# Patient Record
Sex: Female | Born: 1990 | Race: White | Hispanic: No | Marital: Married | Smoking: Never smoker
Health system: Southern US, Community
[De-identification: ages and names within clinical notes are randomized; demographics above are authoritative.]

## PROBLEM LIST (undated history)

## (undated) ENCOUNTER — Inpatient Hospital Stay (HOSPITAL_COMMUNITY): Payer: Self-pay

## (undated) DIAGNOSIS — N200 Calculus of kidney: Secondary | ICD-10-CM

## (undated) DIAGNOSIS — Z3490 Encounter for supervision of normal pregnancy, unspecified, unspecified trimester: Secondary | ICD-10-CM

## (undated) DIAGNOSIS — R87629 Unspecified abnormal cytological findings in specimens from vagina: Secondary | ICD-10-CM

## (undated) DIAGNOSIS — Z30017 Encounter for initial prescription of implantable subdermal contraceptive: Principal | ICD-10-CM

## (undated) DIAGNOSIS — D34 Benign neoplasm of thyroid gland: Secondary | ICD-10-CM

## (undated) HISTORY — PX: OTHER SURGICAL HISTORY: SHX169

## (undated) HISTORY — DX: Encounter for initial prescription of implantable subdermal contraceptive: Z30.017

## (undated) HISTORY — DX: Unspecified abnormal cytological findings in specimens from vagina: R87.629

## (undated) HISTORY — DX: Benign neoplasm of thyroid gland: D34

## (undated) HISTORY — DX: Calculus of kidney: N20.0

---
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
## 2012-04-15 ENCOUNTER — Encounter (HOSPITAL_COMMUNITY): Payer: Self-pay | Admitting: *Deleted

## 2012-04-15 ENCOUNTER — Emergency Department (HOSPITAL_COMMUNITY)
Admission: EM | Admit: 2012-04-15 | Discharge: 2012-04-15 | Disposition: A | Discharge status: Home / Self Care | Payer: Self-pay | Attending: Emergency Medicine | Admitting: Emergency Medicine

## 2012-04-15 DIAGNOSIS — R509 Fever, unspecified: Secondary | ICD-10-CM | POA: Insufficient documentation

## 2012-04-15 DIAGNOSIS — R599 Enlarged lymph nodes, unspecified: Secondary | ICD-10-CM | POA: Insufficient documentation

## 2012-04-15 DIAGNOSIS — J029 Acute pharyngitis, unspecified: Secondary | ICD-10-CM | POA: Insufficient documentation

## 2012-04-15 MED ORDER — PENICILLIN G BENZATHINE 1200000 UNIT/2ML IM SUSP
1.2000 10*6.[IU] | Freq: Once | INTRAMUSCULAR | Status: AC
  Administered 2012-04-15: 1.2 10*6.[IU] via INTRAMUSCULAR
  Filled 2012-04-15: qty 2

## 2012-04-15 NOTE — ED Provider Notes (Signed)
History     CSN: [id number]  Arrival date & time 04/15/12  1657   First MD Initiated Contact with Patient 04/15/12 1714      Chief Complaint  Patient presents with  . Sore Throat    (Consider location/radiation/quality/duration/timing/severity/associated sxs/prior treatment) Patient is a 21 y.o. female presenting with pharyngitis. The history is provided by the patient. No language interpreter was used.  Sore Throat This is a new problem. Episode onset: 2 days ago. The problem occurs constantly. The problem has been unchanged. Associated symptoms include a fever, a sore throat and swollen glands. Pertinent negatives include no headaches. The symptoms are aggravated by swallowing. She has tried nothing for the symptoms.    History reviewed. No pertinent past medical history.  Past Surgical History  Procedure Date  . Parathyroid gland removed     History reviewed. No pertinent family history.  History  Substance Use Topics  . Smoking status: Never Smoker   . Smokeless tobacco: Not on file  . Alcohol Use: No    OB History    Grav Para Term Preterm Abortions TAB SAB Ect Mult Living                  Review of Systems  Constitutional: Positive for fever.  HENT: Positive for sore throat.   Neurological: Negative for headaches.  All other systems reviewed and are negative.    Allergies  Review of patient's allergies indicates no known allergies.  Home Medications   Current Outpatient Rx  Name Route Sig Dispense Refill  . IBUPROFEN 200 MG PO TABS Oral Take 400 mg by mouth as needed. For sore throat/pain      BP 113/66  Pulse 77  Temp(Src) 98 F (36.7 C) (Oral)  Resp 24  Ht 5\' 7"  (1.702 m)  Wt 135 lb (61.236 kg)  BMI 21.14 kg/m2  SpO2 100%  LMP 04/13/2012  Physical Exam  Nursing note and vitals reviewed. Constitutional: She is oriented to person, place, and time. She appears well-developed and well-nourished. No distress.  HENT:  Head: Normocephalic  and atraumatic. No trismus in the jaw.  Right Ear: External ear normal.  Left Ear: External ear normal.  Mouth/Throat: Uvula is midline and mucous membranes are normal. No dental abscesses or uvula swelling. Oropharyngeal exudate and posterior oropharyngeal erythema present. No posterior oropharyngeal edema or tonsillar abscesses.  Eyes: EOM are normal.  Neck: Normal range of motion. No rigidity. Normal range of motion present.  Cardiovascular: Normal rate, regular rhythm and normal heart sounds.   Pulmonary/Chest: Effort normal and breath sounds normal.  Abdominal: Soft. She exhibits no distension. There is no tenderness.  Musculoskeletal: Normal range of motion.  Lymphadenopathy:    She has cervical adenopathy.       Right cervical: Superficial cervical adenopathy present.       Left cervical: Superficial cervical adenopathy present.  Neurological: She is alert and oriented to person, place, and time.  Skin: Skin is warm and dry.  Psychiatric: She has a normal mood and affect. Judgment normal.    ED Course  Procedures (including critical care time)  Labs Reviewed - No data to display No results found.   1. Acute pharyngitis       MDM    Strep appearance.  No sign of peritonsillar abscess.      Worthy Rancher, PA 04/15/12 [phone]

## 2012-04-15 NOTE — ED Notes (Signed)
No adverse reaction to injection noted.

## 2012-04-15 NOTE — ED Notes (Signed)
Pt c/o sore throat x 2 days. Denies cough or congestion. C/o painful swallowing.

## 2012-04-15 NOTE — Discharge Instructions (Signed)
Pharyngitis, Viral and Bacterial Pharyngitis is soreness (inflammation) or infection of the pharynx. It is also called a sore throat. CAUSES  Most sore throats are caused by viruses and are part of a cold. However, some sore throats are caused by strep and other bacteria. Sore throats can also be caused by post nasal drip from draining sinuses, allergies and sometimes from sleeping with an open mouth. Infectious sore throats can be spread from person to person by coughing, sneezing and sharing cups or eating utensils. TREATMENT  Sore throats that are viral usually last 3-4 days. Viral illness will get better without medications (antibiotics). Strep throat and other bacterial infections will usually begin to get better about 24-48 hours after you begin to take antibiotics. HOME CARE INSTRUCTIONS   If the caregiver feels there is a bacterial infection or if there is a positive strep test, they will prescribe an antibiotic. The full course of antibiotics must be taken. If the full course of antibiotic is not taken, you or your child may become ill again. If you or your child has strep throat and do not finish all of the medication, serious heart or kidney diseases may develop.   Drink enough water and fluids to keep your urine clear or pale yellow.   Only take over-the-counter or prescription medicines for pain, discomfort or fever as directed by your caregiver.   Get lots of rest.   Gargle with salt water ( tsp. of salt in a glass of water) as often as every 1-2 hours as you need for comfort.   Hard candies may soothe the throat if individual is not at risk for choking. Throat sprays or lozenges may also be used.  SEEK MEDICAL CARE IF:   Large, tender lumps in the neck develop.   A rash develops.   Green, yellow-brown or bloody sputum is coughed up.   Your baby is older than 3 months with a rectal temperature of 100.5 F (38.1 C) or higher for more than 1 day.  SEEK IMMEDIATE MEDICAL CARE  IF:   A stiff neck develops.   You or your child are drooling or unable to swallow liquids.   You or your child are vomiting, unable to keep medications or liquids down.   You or your child has severe pain, unrelieved with recommended medications.   You or your child are having difficulty breathing (not due to stuffy nose).   You or your child are unable to fully open your mouth.   You or your child develop redness, swelling, or severe pain anywhere on the neck.   You have a fever.   Your baby is older than 3 months with a rectal temperature of 102 F (38.9 C) or higher.   Your baby is 35 months old or younger with a rectal temperature of 100.4 F (38 C) or higher.  MAKE SURE YOU:   Understand these instructions.   Will watch your condition.   Will get help right away if you are not doing well or get worse.  Document Released: 11/18/2005 Document Revised: 11/07/2011 Document Reviewed: 02/15/2008 [facility] Patient Information 2012 [place], [place].   You have been presumptively treated for strep throat.  Take tylenol or ibuprofen for fever or discomfort.  Gargle frequently with salt water.   Chloraseptic will help also.

## 2012-04-15 NOTE — ED Provider Notes (Signed)
Medical screening examination/treatment/procedure(s) were performed by non-physician practitioner and as supervising physician I was immediately available for consultation/collaboration.   Gage Weant, MD 04/15/12 1925 

## 2012-06-27 IMAGING — NM NM OCTREOTIDE LOCALIZATION
1 series · 6 of 6 positions shown · non-contrast
Comparison: None

CLINICAL DATA: Elevated parathyroid levels.  Parathyroid hormone
level equals 102

NUCLEAR MEDICINE PARATHYROID SCAN WITH SPECT IMAGING
TECHNIQUE: After intravenous administration of radiopharmaceutical
images of the neck and mediastinum were obtained at approximately
10 minutes 90 minutes and 180 minutes. SPECT imaging performed.
Radiopharmaceutical: 26.0  mCi technetium sestamibi

[parathyroid · 4.66mm/px · 6 of 128 frames shown]
[frame 11/128]
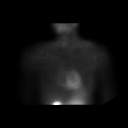
[frame 32/128]
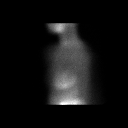
[frame 54/128]
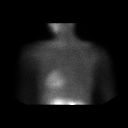
[frame 75/128]
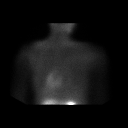
[frame 96/128]
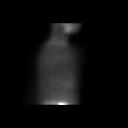
[frame 118/128]
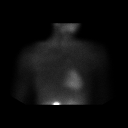

[6 of 6 positions shown; findings below may reference images not displayed]

FINDINGS: There is normal uptake within the thyroid gland on the
immediate 15-minute planar images.  There is washout of activity
from the thyroid gland on 1 hour and 2 hours delayed planar images.
On the two hour delayed planar image, there is a focus of retained
activity in the region of the inferior aspect of the right lobe the
thyroid gland. of the thyroid gland.  This activity is confirmed on
the SPECT images.
IMPRESSION: Focal activity localizing to the inferior aspect of the right lobe
of the thyroid gland would be consistent with a parathyroid adenoma
in the appropriate clinical setting.

## 2012-09-24 ENCOUNTER — Emergency Department (HOSPITAL_COMMUNITY): Payer: Medicaid Other

## 2012-09-24 ENCOUNTER — Emergency Department (HOSPITAL_COMMUNITY)
Admission: EM | Admit: 2012-09-24 | Discharge: 2012-09-24 | Disposition: A | Discharge status: Home / Self Care | Payer: Medicaid Other | Attending: Emergency Medicine | Admitting: Emergency Medicine

## 2012-09-24 ENCOUNTER — Encounter (HOSPITAL_COMMUNITY): Payer: Self-pay | Admitting: *Deleted

## 2012-09-24 DIAGNOSIS — R1013 Epigastric pain: Secondary | ICD-10-CM | POA: Insufficient documentation

## 2012-09-24 DIAGNOSIS — Z3490 Encounter for supervision of normal pregnancy, unspecified, unspecified trimester: Secondary | ICD-10-CM

## 2012-09-24 DIAGNOSIS — O99891 Other specified diseases and conditions complicating pregnancy: Secondary | ICD-10-CM | POA: Insufficient documentation

## 2012-09-24 HISTORY — DX: Encounter for supervision of normal pregnancy, unspecified, unspecified trimester: Z34.90

## 2012-09-24 LAB — BASIC METABOLIC PANEL
CO2: 23 mEq/L (ref 19–32)
GFR calc non Af Amer: >90 mL/min (ref >90)
Glucose, Bld: 85 mg/dL (ref 70–99)
Potassium: 3.6 mEq/L (ref 3.5–5.1)
Sodium: 137 mEq/L (ref 135–145)

## 2012-09-24 LAB — HCG, QUANTITATIVE, PREGNANCY: hCG, Beta Chain, Quant, S: 386 m[IU]/mL — ABNORMAL HIGH (ref <5)

## 2012-09-24 LAB — URINALYSIS, ROUTINE W REFLEX MICROSCOPIC
Leukocytes, UA: NEGATIVE
Nitrite: NEGATIVE
Specific Gravity, Urine: <1.005 — ABNORMAL LOW (ref 1.005–1.030)
Urobilinogen, UA: 0.2 mg/dL (ref 0.0–1.0)
pH: 6.5 (ref 5.0–8.0)

## 2012-09-24 LAB — CBC
Hemoglobin: 13.1 g/dL (ref 12.0–15.0)
Platelets: 263 10*3/uL (ref 150–400)
RBC: 4.44 MIL/uL (ref 3.87–5.11)

## 2012-09-24 LAB — PREGNANCY, URINE: Preg Test, Ur: POSITIVE — AB

## 2012-09-24 LAB — ABO/RH: ABO/RH(D): A NEG

## 2012-09-24 MED ORDER — PROMETHAZINE HCL 25 MG/ML IJ SOLN: 12.5000 mg | Freq: Once | INTRAMUSCULAR | Status: DC

## 2012-09-24 MED ORDER — HYDROMORPHONE HCL PF 1 MG/ML IJ SOLN: 1.0000 mg | Freq: Once | INTRAMUSCULAR | Status: DC

## 2012-09-24 NOTE — ED Provider Notes (Signed)
History   This chart was scribed for Aimee Co, MD by Aimee Burns. The patient was seen in room APA08/APA08 and the patient's care was started at 1:12PM.     CSN: [id number]  Arrival date & time 09/24/12  1147   First MD Initiated Contact with Patient 09/24/12 1312      Chief Complaint  Patient presents with  . Abdominal Pain    (Consider location/radiation/quality/duration/timing/severity/associated sxs/prior treatment) The history is provided by the patient.    Aimee Burns is a 21 y.o. female who presents to the Emergency Department complaining of   Generalized upper abdominal pain located at the epigastrium, onset yesterday (09/23/12).  Associated symptoms include lower back pain, both of which began yesterday (09/23/12) The pt reports she was referred to [facility] by her PCP to follow up with her abdominal and lower back pain. The pt has recently had 2 positive pregnancy tests ( one at home, one at [facility] office), prior to being seen today at [facility]. The pt's LNMP was 08/23/12.   The pt denies dysuria, lower abdominal pain, nausea, and vomiting.   The pt does not smoke or drink alcohol.   Pt does not have OB/GYN at present.    Past Medical History  Diagnosis Date  . Pregnancy     Past Surgical History  Procedure Date  . Parathyroid gland removed     History reviewed. No pertinent family history.  History  Substance Use Topics  . Smoking status: Never Smoker   . Smokeless tobacco: Not on file  . Alcohol Use: No    OB History    Grav Para Term Preterm Abortions TAB SAB Ect Mult Living                  Review of Systems  Gastrointestinal: Positive for abdominal pain.  Musculoskeletal: Positive for back pain.  All other systems reviewed and are negative.    Allergies  Review of patient's allergies indicates no known allergies.  Home Medications  No current outpatient prescriptions on file.  BP 124/69  Pulse 85  Temp 97.5 F (36.4 C)  Resp 20  Ht  5\' 8"  (1.727 m)  Wt 128 lb (58.06 kg)  BMI 19.46 kg/m2  SpO2 100%  LMP 08/23/2012  Physical Exam  Nursing note and vitals reviewed. Constitutional: She is oriented to person, place, and time. She appears well-developed and well-nourished. No distress.  HENT:  Head: Normocephalic and atraumatic.  Eyes: EOM are normal.  Neck: Normal range of motion.  Cardiovascular: Normal rate, regular rhythm and normal heart sounds.   Pulmonary/Chest: Effort normal and breath sounds normal.  Abdominal: Soft. She exhibits no distension. There is tenderness.       Epigastric tenderness detected. No lower abdominal tenderness detected.   Musculoskeletal: Normal range of motion.  Neurological: She is alert and oriented to person, place, and time.  Skin: Skin is warm and dry.  Psychiatric: She has a normal mood and affect. Judgment normal.    ED Course  Procedures (including critical care time)  DIAGNOSTIC STUDIES: Oxygen Saturation is 100% on room air, normal by my interpretation.    COORDINATION OF CARE:  1:20 PM- Treatment plan concerning blood work, ultrasound, and taking prenatal vitamins  discussed with patient. Pt agrees with treatment.      Results for orders placed during the hospital encounter of 09/24/12  PREGNANCY, URINE      Component Value Range   Preg Test, Ur POSITIVE (*) NEGATIVE  URINALYSIS,  ROUTINE W REFLEX MICROSCOPIC      Component Value Range   Color, Urine YELLOW  YELLOW   APPearance CLEAR  CLEAR   Specific Gravity, Urine <1.005 (*) 1.005 - 1.030   pH 6.5  5.0 - 8.0   Glucose, UA NEGATIVE  NEGATIVE mg/dL   Hgb urine dipstick NEGATIVE  NEGATIVE   Bilirubin Urine NEGATIVE  NEGATIVE   Ketones, ur NEGATIVE  NEGATIVE mg/dL   Protein, ur NEGATIVE  NEGATIVE mg/dL   Urobilinogen, UA 0.2  0.0 - 1.0 mg/dL   Nitrite NEGATIVE  NEGATIVE   Leukocytes, UA NEGATIVE  NEGATIVE  CBC      Component Value Range   WBC 7.0  4.0 - 10.5 K/uL   RBC 4.44  3.87 - 5.11 MIL/uL    Hemoglobin 13.1  12.0 - 15.0 g/dL   HCT 30.8  65.7 - 84.6 %   MCV 87.6  78.0 - 100.0 fL   MCH 29.5  26.0 - 34.0 pg   MCHC 33.7  30.0 - 36.0 g/dL   RDW 96.2  95.2 - 84.1 %   Platelets 263  150 - 400 K/uL  BASIC METABOLIC PANEL      Component Value Range   Sodium 137  135 - 145 mEq/L   Potassium 3.6  3.5 - 5.1 mEq/L   Chloride 104  96 - 112 mEq/L   CO2 23  19 - 32 mEq/L   Glucose, Bld 85  70 - 99 mg/dL   BUN 7  6 - 23 mg/dL   Creatinine, Ser 3.24  0.50 - 1.10 mg/dL   Calcium 9.8  8.4 - 40.1 mg/dL   GFR calc non Af Amer >90  >90 mL/min   GFR calc Af Amer >90  >90 mL/min  ABO/RH      Component Value Range   ABO/RH(D) A NEG    HCG, QUANTITATIVE, PREGNANCY      Component Value Range   hCG, Beta Chain, Quant, S 386 (*) <5 mIU/mL   US Ob Comp Less 14 Wks  09/24/2012  *RADIOLOGY REPORT*  Clinical Data: Left-sided abdominal pain.  4-week-4-day gestational age by LMP.  Positive pregnancy test.  OBSTETRIC <14 WK [place] AND TRANSVAGINAL OB US  Technique:  Both transabdominal and transvaginal ultrasound examinations were performed for complete evaluation of the gestation as well as the maternal uterus, adnexal regions, and pelvic cul-de-sac.  Transvaginal technique was performed to assess early pregnancy.  Comparison:  None.  Findings:  The endometrium has a thickened decidualized appearance, and there is a 3 mm fluid collection in the endometrial cavity likely representing an early intrauterine gestational sac.  No yolk sac visualized.  A left ovarian corpus luteum cyst is seen which measures 3.5 cm.  A small amount of free fluid is seen in the left adnexa.  Right ovary is not directly visualized by transabdominal or transvaginal sonography, however no adnexal mass identified.  IMPRESSION:  1.  Probable early approximately 5-week intrauterine gestational sac, which would correspond with the given LMP.  Recommend follow up of quantitative beta HCG levels, and follow-up by ultrasound in 14 days to confirm  appropriate progression/viability. 2.  3.5 cm left ovarian corpus luteum and small amount of free fluid.  No adnexal mass identified.   Original Report Authenticated By: Danae Orleans, M.D.    US Ob Transvaginal  09/24/2012  *RADIOLOGY REPORT*  Clinical Data: Left-sided abdominal pain.  4-week-4-day gestational age by LMP.  Positive pregnancy test.  OBSTETRIC <14 WK  [place] AND TRANSVAGINAL OB US  Technique:  Both transabdominal and transvaginal ultrasound examinations were performed for complete evaluation of the gestation as well as the maternal uterus, adnexal regions, and pelvic cul-de-sac.  Transvaginal technique was performed to assess early pregnancy.  Comparison:  None.  Findings:  The endometrium has a thickened decidualized appearance, and there is a 3 mm fluid collection in the endometrial cavity likely representing an early intrauterine gestational sac.  No yolk sac visualized.  A left ovarian corpus luteum cyst is seen which measures 3.5 cm.  A small amount of free fluid is seen in the left adnexa.  Right ovary is not directly visualized by transabdominal or transvaginal sonography, however no adnexal mass identified.  IMPRESSION:  1.  Probable early approximately 5-week intrauterine gestational sac, which would correspond with the given LMP.  Recommend follow up of quantitative beta HCG levels, and follow-up by ultrasound in 14 days to confirm appropriate progression/viability. 2.  3.5 cm left ovarian corpus luteum and small amount of free fluid.  No adnexal mass identified.   Original Report Authenticated By: Danae Orleans, M.D.     I personally reviewed the imaging tests through PACS system  I reviewed available ER/hospitalization records thought the EMR    1. Pregnant       MDM  The patient has no lower abdominal pain.  She is no vaginal bleeding.  Quant is 380.  Early gestational sac seen.  No obvious adnexal masses noted.  Early ectopic precautions given.  Close OB/GYN followup.   Instructions return to the ER for development of lower abdominal pain or vaginal bleeding or any other new or concerning symptoms.  Recommend the patient not drink, use illicit drugs, smokes cigarettes.  I recommended daily prenatal vitamins      I personally performed the services described in this documentation, which was scribed in my presence. The recorded information has been reviewed and considered.      Aimee Co, MD 09/24/12 [phone]

## 2012-09-24 NOTE — ED Notes (Signed)
Pt c/o abd pain located around umbilicus area that started last night, denies any n/v/d, admits to vaginal discharge, unsure of any color, denies any odor, pt states that she did take a home pregnancy test that was positive and had a pregnancy test done at pcp office today that was positive, pt was sent to er by pcp when she advised them ref. The abd pain. Last period was 08/23/2012

## 2012-10-12 LAB — OB RESULTS CONSOLE ANTIBODY SCREEN: Antibody Screen: NEGATIVE

## 2012-10-12 LAB — OB RESULTS CONSOLE RPR: RPR: NONREACTIVE

## 2012-10-12 LAB — OB RESULTS CONSOLE HIV ANTIBODY (ROUTINE TESTING): HIV: NONREACTIVE

## 2012-10-12 LAB — OB RESULTS CONSOLE ABO/RH: RH Type: NEGATIVE

## 2012-10-12 LAB — OB RESULTS CONSOLE GC/CHLAMYDIA: Gonorrhea: NEGATIVE

## 2012-10-12 LAB — OB RESULTS CONSOLE HEPATITIS B SURFACE ANTIGEN: Hepatitis B Surface Ag: NEGATIVE

## 2012-12-02 NOTE — L&D Delivery Note (Signed)
Delivery Note At 8:54 PM a viable female was delivered via Vaginal, Vacuum (Extractor) with 1 pull.  No pop offs during extraction.  APGAR:  Unavailable at time of note.  Placenta status: Intact, Spontaneous.  Cord: 3 vessels with the following complications: nuchal x3.  Cord pH: 7.30 (venous).   Anesthesia: Epidural  Episiotomy: None Lacerations: 1st degree laceration Suture Repair: 3.0 vicryl Est. Blood Loss (mL):  300 cc  Mom to postpartum.  Baby to nursery-stable.  [facility] 05/17/2013, 9:15 PM

## 2012-12-02 NOTE — L&D Delivery Note (Signed)
Operative Delivery Note At  a viable female was delivered via vacuum assisted delivery .  Presentation: vertex; Position: Left,, Occiput,, Anterior; Station: +3.  Verbal consent: obtained from patient.  Risks and benefits discussed in detail.  Risks include, but are not limited to the risks of anesthesia, bleeding, infection, damage to maternal tissues, fetal cephalhematoma.  There is also the risk of inability to effect vaginal delivery of the head, or shoulder dystocia that cannot be resolved by established maneuvers, leading to the need for emergency cesarean section.  APGAR: , ; weight .   Placenta status: , .   Cord:  with the following complications: .  Cord pH: pending  Anesthesia:  epidural Instruments: Kiwi vacuum Episiotomy: none Lacerations: 1st degree vaginal laceration Suture Repair: 3.0 vicryl Est. Blood Loss (mL):   Mom to postpartum.  Baby to nursery-stable.  HARRAWAY-SMITH, Aariana Shankland 05/17/2013, 9:38 PM

## 2013-02-11 ENCOUNTER — Encounter: Payer: Self-pay | Admitting: *Deleted

## 2013-02-11 DIAGNOSIS — D34 Benign neoplasm of thyroid gland: Secondary | ICD-10-CM | POA: Insufficient documentation

## 2013-02-11 DIAGNOSIS — N200 Calculus of kidney: Secondary | ICD-10-CM | POA: Insufficient documentation

## 2013-03-08 ENCOUNTER — Ambulatory Visit (INDEPENDENT_AMBULATORY_CARE_PROVIDER_SITE_OTHER): Payer: Medicaid Other | Admitting: Obstetrics and Gynecology

## 2013-03-08 ENCOUNTER — Encounter: Payer: Self-pay | Admitting: Obstetrics and Gynecology

## 2013-03-08 VITALS — BP 106/64 | Wt 148.2 lb

## 2013-03-08 DIAGNOSIS — O99019 Anemia complicating pregnancy, unspecified trimester: Secondary | ICD-10-CM

## 2013-03-08 DIAGNOSIS — Z331 Pregnant state, incidental: Secondary | ICD-10-CM

## 2013-03-08 DIAGNOSIS — Z3490 Encounter for supervision of normal pregnancy, unspecified, unspecified trimester: Secondary | ICD-10-CM

## 2013-03-08 DIAGNOSIS — D34 Benign neoplasm of thyroid gland: Secondary | ICD-10-CM

## 2013-03-08 DIAGNOSIS — Z1389 Encounter for screening for other disorder: Secondary | ICD-10-CM

## 2013-03-08 DIAGNOSIS — O360990 Maternal care for other rhesus isoimmunization, unspecified trimester, not applicable or unspecified: Secondary | ICD-10-CM

## 2013-03-08 LAB — CBC
HCT: 31.7 % — ABNORMAL LOW (ref 36.0–46.0)
MCHC: 32.5 g/dL (ref 30.0–36.0)
Platelets: 241 10*3/uL (ref 150–400)
RDW: 14 % (ref 11.5–15.5)
WBC: 10.1 10*3/uL (ref 4.0–10.5)

## 2013-03-08 LAB — POCT URINALYSIS DIPSTICK
Ketones, UA: NEGATIVE
Leukocytes, UA: NEGATIVE
Nitrite, UA: NEGATIVE
Protein, UA: NEGATIVE

## 2013-03-08 NOTE — Progress Notes (Signed)
[redacted]w[redacted]d:  Routine prenatal chk:  28 wk labs drawn ; childbirth classes encouraged FOB supportive jvf

## 2013-03-09 LAB — HIV ANTIBODY (ROUTINE TESTING W REFLEX): HIV: NONREACTIVE

## 2013-03-09 LAB — GLUCOSE TOLERANCE, 2 HOURS W/ 1HR: Glucose, Fasting: 75 mg/dL (ref 70–99)

## 2013-03-09 LAB — HSV 2 ANTIBODY, IGG: HSV 2 Glycoprotein G Ab, IgG: <0.1 IV

## 2013-03-09 LAB — RPR

## 2013-04-06 ENCOUNTER — Ambulatory Visit (INDEPENDENT_AMBULATORY_CARE_PROVIDER_SITE_OTHER): Payer: Medicaid Other | Admitting: Advanced Practice Midwife

## 2013-04-06 VITALS — BP 100/60 | Wt 152.0 lb

## 2013-04-06 DIAGNOSIS — O360990 Maternal care for other rhesus isoimmunization, unspecified trimester, not applicable or unspecified: Secondary | ICD-10-CM

## 2013-04-06 DIAGNOSIS — O99019 Anemia complicating pregnancy, unspecified trimester: Secondary | ICD-10-CM

## 2013-04-06 DIAGNOSIS — Z3400 Encounter for supervision of normal first pregnancy, unspecified trimester: Secondary | ICD-10-CM | POA: Insufficient documentation

## 2013-04-06 DIAGNOSIS — Z1389 Encounter for screening for other disorder: Secondary | ICD-10-CM

## 2013-04-06 DIAGNOSIS — Z331 Pregnant state, incidental: Secondary | ICD-10-CM

## 2013-04-06 LAB — POCT URINALYSIS DIPSTICK
Blood, UA: NEGATIVE
Glucose, UA: NEGATIVE
Ketones, UA: NEGATIVE

## 2013-04-06 MED ORDER — RHO D IMMUNE GLOBULIN 1500 UNIT/2ML IJ SOLN
300.0000 ug | Freq: Once | INTRAMUSCULAR | Status: AC
  Administered 2013-04-06: 300 ug via INTRAMUSCULAR

## 2013-04-06 NOTE — Progress Notes (Signed)
"  braxton hicks"

## 2013-04-06 NOTE — Progress Notes (Signed)
Had an episode of BH contractions at work.  No c/o at this time.Received Rhogam today  Routine questions about pregnancy answered.  F/U in 2 weeks for LROB.

## 2013-04-20 ENCOUNTER — Ambulatory Visit (INDEPENDENT_AMBULATORY_CARE_PROVIDER_SITE_OTHER): Payer: Medicaid Other | Admitting: Women's Health

## 2013-04-20 ENCOUNTER — Encounter: Payer: Self-pay | Admitting: Women's Health

## 2013-04-20 ENCOUNTER — Other Ambulatory Visit: Payer: Self-pay | Admitting: Women's Health

## 2013-04-20 ENCOUNTER — Ambulatory Visit (INDEPENDENT_AMBULATORY_CARE_PROVIDER_SITE_OTHER): Payer: Medicaid Other

## 2013-04-20 VITALS — BP 110/60 | Wt 155.8 lb

## 2013-04-20 DIAGNOSIS — Z3403 Encounter for supervision of normal first pregnancy, third trimester: Secondary | ICD-10-CM

## 2013-04-20 DIAGNOSIS — O26849 Uterine size-date discrepancy, unspecified trimester: Secondary | ICD-10-CM | POA: Insufficient documentation

## 2013-04-20 DIAGNOSIS — O368390 Maternal care for abnormalities of the fetal heart rate or rhythm, unspecified trimester, not applicable or unspecified: Secondary | ICD-10-CM

## 2013-04-20 DIAGNOSIS — O368190 Decreased fetal movements, unspecified trimester, not applicable or unspecified: Secondary | ICD-10-CM

## 2013-04-20 DIAGNOSIS — Z1389 Encounter for screening for other disorder: Secondary | ICD-10-CM

## 2013-04-20 DIAGNOSIS — O26843 Uterine size-date discrepancy, third trimester: Secondary | ICD-10-CM

## 2013-04-20 DIAGNOSIS — Z331 Pregnant state, incidental: Secondary | ICD-10-CM

## 2013-04-20 LAB — POCT URINALYSIS DIPSTICK
Glucose, UA: NEGATIVE
Ketones, UA: NEGATIVE
Protein, UA: NEGATIVE

## 2013-04-20 NOTE — Progress Notes (Addendum)
U/S(33+4wks)-vtx active fetus BPP 8/8, fluid wnl AFI=9.5cm, post gr 1 plac, EFW 4 lb 2 oz (1878gms, 4 lb 2 oz), 21st%tile, female fetus ("[place]")

## 2013-04-20 NOTE — Progress Notes (Addendum)
Reports decreased fm x 1wk. Denies uc's, lof, vb, urinary urgency, hesitancy, or dysuria.  Does have urinary frequency- urine dip today neg.  All questions answered. NST non-reactive.  U/S today BPP 8/8, efw 21%, afi 9.5cm. Reviewed ptl s/s and fetal kick counts.  F/u 2wks for visit.

## 2013-04-20 NOTE — Patient Instructions (Signed)
Fetal Movement Counts Patient Name: __________________________________________________ Patient Due Date: ____________________ Kick counts is highly recommended in high risk pregnancies, but it is a good idea for every pregnant woman to do. Start counting fetal movements at 28 weeks of the pregnancy. Fetal movements increase after eating a full meal or eating or drinking something sweet (the blood sugar is higher). It is also important to drink plenty of fluids (well hydrated) before doing the count. Lie on your left side because it helps with the circulation or you can sit in a comfortable chair with your arms over your belly (abdomen) with no distractions around you. DOING THE COUNT  Try to do the count the same time of day each time you do it.  Mark the day and time, then see how long it takes for you to feel 10 movements (kicks, flutters, swishes, rolls). You should have at least 10 movements within 2 hours. You will most likely feel 10 movements in much less than 2 hours. If you do not, wait an hour and count again. After a couple of days you will see a pattern.  What you are looking for is a change in the pattern or not enough counts in 2 hours. Is it taking longer in time to reach 10 movements? SEEK MEDICAL CARE IF:  You feel less than 10 counts in 2 hours. Tried twice.  No movement in one hour.  The pattern is changing or taking longer each day to reach 10 counts in 2 hours.  You feel the baby is not moving as it usually does. Date: ____________ Movements: ____________ Start time: ____________ Finish time: ____________  Date: ____________ Movements: ____________ Start time: ____________ Finish time: ____________ Date: ____________ Movements: ____________ Start time: ____________ Finish time: ____________ Date: ____________ Movements: ____________ Start time: ____________ Finish time: ____________ Date: ____________ Movements: ____________ Start time: ____________ Finish time:  ____________ Date: ____________ Movements: ____________ Start time: ____________ Finish time: ____________ Date: ____________ Movements: ____________ Start time: ____________ Finish time: ____________ Date: ____________ Movements: ____________ Start time: ____________ Finish time: ____________  Date: ____________ Movements: ____________ Start time: ____________ Finish time: ____________ Date: ____________ Movements: ____________ Start time: ____________ Finish time: ____________ Date: ____________ Movements: ____________ Start time: ____________ Finish time: ____________ Date: ____________ Movements: ____________ Start time: ____________ Finish time: ____________ Date: ____________ Movements: ____________ Start time: ____________ Finish time: ____________ Date: ____________ Movements: ____________ Start time: ____________ Finish time: ____________ Date: ____________ Movements: ____________ Start time: ____________ Finish time: ____________  Date: ____________ Movements: ____________ Start time: ____________ Finish time: ____________ Date: ____________ Movements: ____________ Start time: ____________ Finish time: ____________ Date: ____________ Movements: ____________ Start time: ____________ Finish time: ____________ Date: ____________ Movements: ____________ Start time: ____________ Finish time: ____________ Date: ____________ Movements: ____________ Start time: ____________ Finish time: ____________ Date: ____________ Movements: ____________ Start time: ____________ Finish time: ____________ Date: ____________ Movements: ____________ Start time: ____________ Finish time: ____________  Date: ____________ Movements: ____________ Start time: ____________ Finish time: ____________ Date: ____________ Movements: ____________ Start time: ____________ Finish time: ____________ Date: ____________ Movements: ____________ Start time: ____________ Finish time: ____________ Date: ____________ Movements:  ____________ Start time: ____________ Finish time: ____________ Date: ____________ Movements: ____________ Start time: ____________ Finish time: ____________ Date: ____________ Movements: ____________ Start time: ____________ Finish time: ____________ Date: ____________ Movements: ____________ Start time: ____________ Finish time: ____________  Date: ____________ Movements: ____________ Start time: ____________ Finish time: ____________ Date: ____________ Movements: ____________ Start time: ____________ Finish time: ____________ Date: ____________ Movements: ____________ Start time: ____________ Finish time: ____________ Date: ____________ Movements:   ____________ Start time: ____________ Finish time: ____________ Date: ____________ Movements: ____________ Start time: ____________ Finish time: ____________ Date: ____________ Movements: ____________ Start time: ____________ Finish time: ____________ Date: ____________ Movements: ____________ Start time: ____________ Finish time: ____________  Date: ____________ Movements: ____________ Start time: ____________ Finish time: ____________ Date: ____________ Movements: ____________ Start time: ____________ Finish time: ____________ Date: ____________ Movements: ____________ Start time: ____________ Finish time: ____________ Date: ____________ Movements: ____________ Start time: ____________ Finish time: ____________ Date: ____________ Movements: ____________ Start time: ____________ Finish time: ____________ Date: ____________ Movements: ____________ Start time: ____________ Finish time: ____________ Date: ____________ Movements: ____________ Start time: ____________ Finish time: ____________  Date: ____________ Movements: ____________ Start time: ____________ Finish time: ____________ Date: ____________ Movements: ____________ Start time: ____________ Finish time: ____________ Date: ____________ Movements: ____________ Start time: ____________ Finish  time: ____________ Date: ____________ Movements: ____________ Start time: ____________ Finish time: ____________ Date: ____________ Movements: ____________ Start time: ____________ Finish time: ____________ Date: ____________ Movements: ____________ Start time: ____________ Finish time: ____________ Date: ____________ Movements: ____________ Start time: ____________ Finish time: ____________  Date: ____________ Movements: ____________ Start time: ____________ Finish time: ____________ Date: ____________ Movements: ____________ Start time: ____________ Finish time: ____________ Date: ____________ Movements: ____________ Start time: ____________ Finish time: ____________ Date: ____________ Movements: ____________ Start time: ____________ Finish time: ____________ Date: ____________ Movements: ____________ Start time: ____________ Finish time: ____________ Date: ____________ Movements: ____________ Start time: ____________ Finish time: ____________ Document Released: 12/18/2006 Document Revised: 02/10/2012 Document Reviewed: 06/20/2009 [employer] Patient Information 2013 [employer].  

## 2013-04-28 LAB — US OB FOLLOW UP

## 2013-05-04 ENCOUNTER — Encounter: Payer: Self-pay | Admitting: Women's Health

## 2013-05-04 ENCOUNTER — Ambulatory Visit (INDEPENDENT_AMBULATORY_CARE_PROVIDER_SITE_OTHER): Payer: Medicaid Other | Admitting: Women's Health

## 2013-05-04 VITALS — BP 118/60 | Wt 158.0 lb

## 2013-05-04 DIAGNOSIS — Z331 Pregnant state, incidental: Secondary | ICD-10-CM

## 2013-05-04 DIAGNOSIS — O288 Other abnormal findings on antenatal screening of mother: Secondary | ICD-10-CM

## 2013-05-04 DIAGNOSIS — Z1389 Encounter for screening for other disorder: Secondary | ICD-10-CM

## 2013-05-04 DIAGNOSIS — Z3403 Encounter for supervision of normal first pregnancy, third trimester: Secondary | ICD-10-CM

## 2013-05-04 DIAGNOSIS — O360990 Maternal care for other rhesus isoimmunization, unspecified trimester, not applicable or unspecified: Secondary | ICD-10-CM

## 2013-05-04 DIAGNOSIS — O99019 Anemia complicating pregnancy, unspecified trimester: Secondary | ICD-10-CM

## 2013-05-04 LAB — POCT URINALYSIS DIPSTICK
Blood, UA: NEGATIVE
Glucose, UA: NEGATIVE
Ketones, UA: NEGATIVE
Protein, UA: NEGATIVE

## 2013-05-04 NOTE — Patient Instructions (Signed)

## 2013-05-04 NOTE — Progress Notes (Signed)
Pain in back and lower belly that goes into both sides.

## 2013-05-04 NOTE — Progress Notes (Signed)
Reports good fm. Denies uc's, lof, vb, urinary frequency, urgency, hesitancy, or dysuria.  No complaints.  Reviewed ptl s/s and fetal kick counts.  All questions answered. F/U in 1wks for f/u u/s and visit.

## 2013-05-05 ENCOUNTER — Telehealth: Payer: Self-pay | Admitting: Obstetrics & Gynecology

## 2013-05-05 ENCOUNTER — Inpatient Hospital Stay (HOSPITAL_COMMUNITY)
Admission: AD | Admit: 2013-05-05 | Discharge: 2013-05-05 | Disposition: A | Discharge status: Home / Self Care | Payer: Medicaid Other | Source: Ambulatory Visit | Attending: Obstetrics & Gynecology | Admitting: Obstetrics & Gynecology

## 2013-05-05 ENCOUNTER — Encounter (HOSPITAL_COMMUNITY): Payer: Self-pay | Admitting: *Deleted

## 2013-05-05 DIAGNOSIS — M545 Low back pain, unspecified: Secondary | ICD-10-CM | POA: Insufficient documentation

## 2013-05-05 DIAGNOSIS — O479 False labor, unspecified: Secondary | ICD-10-CM

## 2013-05-05 DIAGNOSIS — O4700 False labor before 37 completed weeks of gestation, unspecified trimester: Secondary | ICD-10-CM | POA: Insufficient documentation

## 2013-05-05 DIAGNOSIS — R109 Unspecified abdominal pain: Secondary | ICD-10-CM | POA: Insufficient documentation

## 2013-05-05 LAB — URINALYSIS, ROUTINE W REFLEX MICROSCOPIC
Hgb urine dipstick: NEGATIVE
Specific Gravity, Urine: 1.015 (ref 1.005–1.030)
Urobilinogen, UA: 0.2 mg/dL (ref 0.0–1.0)
pH: 7 (ref 5.0–8.0)

## 2013-05-05 NOTE — Telephone Encounter (Signed)
Pt requesting measurements from yesterdays appt with Joellyn Haff, CNM. Pt informed measuring at 31cm and to keep her U/S appt.

## 2013-05-05 NOTE — MAU Provider Note (Signed)
  History     CSN: [id number]  Arrival date and time: 05/05/13 2725   First Provider Initiated Contact with Patient 05/05/13 2118      Chief Complaint  Patient presents with  . Abdominal Pain  . Back Pain   HPI This is a 22 y.o. female at [redacted]w[redacted]d who presents with c/o low back pain and abdominal tightening today. Feels like menstrual cramps. + fetal movement. Concerned because she measured small today at 31cm.  Has [place] scheduled for next week.   RN Note: Pt states she started having back pain first and then the abdominal pain started, about 1700       OB History   Grav Para Term Preterm Abortions TAB SAB Ect Mult Living   1               Past Medical History  Diagnosis Date  . Pregnancy   . Thyroid tumor, benign   . Renal calculi     2011,right parathyroidectomy    Past Surgical History  Procedure Laterality Date  . Parathyroid gland removed    . Thyroidectomy, partial Right 2011  . Parathyroidectomy / exploration of parathyroids Right 2011    [place], at age18    Family History  Problem Relation Age of Onset  . Diabetes Paternal Grandmother   . Cancer Paternal Grandmother     lung  . Thyroid disease Mother     History  Substance Use Topics  . Smoking status: Never Smoker   . Smokeless tobacco: Not on file  . Alcohol Use: No    Allergies: No Known Allergies  Prescriptions prior to admission  Medication Sig Dispense Refill  . Prenatal Vit-Fe Fumarate-FA (PRENATAL MULTIVITAMIN) TABS Take 1 tablet by mouth daily at 12 noon.        Review of Systems  Constitutional: Negative for fever and malaise/fatigue.  Gastrointestinal: Positive for abdominal pain (mild with contractions). Negative for nausea, vomiting, diarrhea and constipation.  Musculoskeletal: Positive for back pain.  Neurological: Negative for headaches.   Physical Exam   Last menstrual period 08/23/2012.  Physical Exam  Constitutional: She is oriented to person, place, and time. She  appears well-developed and well-nourished. No distress.  HENT:  Head: Normocephalic.  Cardiovascular: Normal rate.   Respiratory: Effort normal.  GI: Soft. She exhibits no distension and no mass. There is no tenderness. There is no rebound and no guarding.  Genitourinary: Vagina normal and uterus normal. No vaginal discharge found.  Dilation: Closed Effacement (%): 80 Cervical Position: Posterior Station: 0 Presentation: Vertex Exam by:: Artelia Laroche CNM  FHR reactive Irregular contractions, mild  Musculoskeletal: Normal range of motion.  Neurological: She is alert and oriented to person, place, and time.  Skin: Skin is warm and dry.  Psychiatric: She has a normal mood and affect.    MAU Course  Procedures  MDM No change in cervix after one hour  Assessment and Plan  A:  SIUP at [redacted]w[redacted]d       Braxton Hicks contractions  P:  I discussed with her that the baby's head is low, which may account for the smaller fundal height. Dr Despina Hidden agrees. Plan to keep [place] appt as scheduled      Discharge home      Labor precautions      Followup in office at [facility] 05/05/2013, 9:19 PM

## 2013-05-05 NOTE — MAU Note (Signed)
Pt states she started having back pain first and then the abdominal pain started, about 1700

## 2013-05-06 ENCOUNTER — Telehealth: Payer: Self-pay | Admitting: Obstetrics and Gynecology

## 2013-05-06 NOTE — Telephone Encounter (Signed)
Pt states was seen at [facility] last night due to Aimee Burns contractions and is concerned about continuing to work, Per Dr. Emelda Fear after reviewing notes in [employer], no reason for pt to be taken out of work at this time, pt to keep her U/S appt for next week. Pt verbalized understanding.

## 2013-05-13 ENCOUNTER — Ambulatory Visit (INDEPENDENT_AMBULATORY_CARE_PROVIDER_SITE_OTHER): Payer: Medicaid Other

## 2013-05-13 ENCOUNTER — Ambulatory Visit (INDEPENDENT_AMBULATORY_CARE_PROVIDER_SITE_OTHER): Payer: Medicaid Other | Admitting: Advanced Practice Midwife

## 2013-05-13 ENCOUNTER — Encounter: Payer: Self-pay | Admitting: Advanced Practice Midwife

## 2013-05-13 VITALS — BP 110/70 | Wt 158.0 lb

## 2013-05-13 DIAGNOSIS — O288 Other abnormal findings on antenatal screening of mother: Secondary | ICD-10-CM

## 2013-05-13 DIAGNOSIS — Z3403 Encounter for supervision of normal first pregnancy, third trimester: Secondary | ICD-10-CM

## 2013-05-13 DIAGNOSIS — Z331 Pregnant state, incidental: Secondary | ICD-10-CM

## 2013-05-13 DIAGNOSIS — O99019 Anemia complicating pregnancy, unspecified trimester: Secondary | ICD-10-CM

## 2013-05-13 DIAGNOSIS — O26843 Uterine size-date discrepancy, third trimester: Secondary | ICD-10-CM

## 2013-05-13 DIAGNOSIS — Z1389 Encounter for screening for other disorder: Secondary | ICD-10-CM

## 2013-05-13 DIAGNOSIS — O360990 Maternal care for other rhesus isoimmunization, unspecified trimester, not applicable or unspecified: Secondary | ICD-10-CM

## 2013-05-13 DIAGNOSIS — O4190X0 Disorder of amniotic fluid and membranes, unspecified, unspecified trimester, not applicable or unspecified: Secondary | ICD-10-CM

## 2013-05-13 LAB — POCT URINALYSIS DIPSTICK
Glucose, UA: NEGATIVE
Ketones, UA: NEGATIVE
Leukocytes, UA: NEGATIVE
Protein, UA: NEGATIVE

## 2013-05-13 NOTE — Progress Notes (Signed)
U/S PERFORMED.efw 6#,9OZ,/45% FOR 37 WKS ,AFI 14.8 CM/55 %, VERTEX LIE, ACTIVE-FEMALE FETUS, HIGH POST. PLAC., CX CLOSED 2.7 CM

## 2013-05-13 NOTE — Addendum Note (Signed)
Addended by: Colen Darling on: 05/13/2013 11:13 AM   Modules accepted: Orders

## 2013-05-13 NOTE — Progress Notes (Signed)
Had growth u/s.  EFW now 45 % and Fluid normal.  GBS collected.    No c/o at this time.  Routine questions about pregnancy answered.  F/U in 1 weeks for LROB.

## 2013-05-14 LAB — GC/CHLAMYDIA PROBE AMP: CT Probe RNA: NEGATIVE

## 2013-05-15 LAB — STREP B DNA PROBE: GBSP: NEGATIVE

## 2013-05-17 ENCOUNTER — Encounter (HOSPITAL_COMMUNITY): Payer: Self-pay | Admitting: Anesthesiology

## 2013-05-17 ENCOUNTER — Inpatient Hospital Stay (HOSPITAL_COMMUNITY): Payer: Medicaid Other

## 2013-05-17 ENCOUNTER — Inpatient Hospital Stay (HOSPITAL_COMMUNITY)
Admission: AD | Admit: 2013-05-17 | Discharge: 2013-05-19 | DRG: 775 | Disposition: A | Discharge status: Home / Self Care | Payer: Medicaid Other | Source: Ambulatory Visit | Attending: Obstetrics & Gynecology | Admitting: Obstetrics & Gynecology

## 2013-05-17 ENCOUNTER — Encounter (HOSPITAL_COMMUNITY): Payer: Self-pay | Admitting: *Deleted

## 2013-05-17 ENCOUNTER — Inpatient Hospital Stay (HOSPITAL_COMMUNITY): Payer: Medicaid Other | Admitting: Anesthesiology

## 2013-05-17 DIAGNOSIS — O26843 Uterine size-date discrepancy, third trimester: Secondary | ICD-10-CM

## 2013-05-17 DIAGNOSIS — O368190 Decreased fetal movements, unspecified trimester, not applicable or unspecified: Secondary | ICD-10-CM | POA: Diagnosis present

## 2013-05-17 DIAGNOSIS — Z3403 Encounter for supervision of normal first pregnancy, third trimester: Secondary | ICD-10-CM

## 2013-05-17 LAB — CBC
MCH: 31.6 pg (ref 26.0–34.0)
MCV: 91.8 fL (ref 78.0–100.0)
Platelets: 211 10*3/uL (ref 150–400)
RBC: 3.64 MIL/uL — ABNORMAL LOW (ref 3.87–5.11)
RDW: 13 % (ref 11.5–15.5)
WBC: 12.2 10*3/uL — ABNORMAL HIGH (ref 4.0–10.5)

## 2013-05-17 MED ORDER — LACTATED RINGERS IV SOLN
500.0000 mL | INTRAVENOUS | Status: DC | PRN
  Administered 2013-05-17: 300 mL via INTRAVENOUS
  Administered 2013-05-17: 1000 mL via INTRAVENOUS
  Administered 2013-05-17: 500 mL via INTRAVENOUS
  Administered 2013-05-17: 300 mL via INTRAVENOUS

## 2013-05-17 MED ORDER — WITCH HAZEL-GLYCERIN EX PADS: 1.0000 "application " | MEDICATED_PAD | CUTANEOUS | Status: DC | PRN

## 2013-05-17 MED ORDER — LIDOCAINE HCL (PF) 1 % IJ SOLN
INTRAMUSCULAR | Status: DC | PRN
  Administered 2013-05-17 (×2): 9 mL

## 2013-05-17 MED ORDER — OXYTOCIN 40 UNITS IN LACTATED RINGERS INFUSION - SIMPLE MED
62.5000 mL/h | INTRAVENOUS | Status: DC
  Filled 2013-05-17: qty 1000

## 2013-05-17 MED ORDER — FENTANYL CITRATE 0.05 MG/ML IJ SOLN
100.0000 ug | Freq: Once | INTRAMUSCULAR | Status: AC
  Administered 2013-05-17: 100 ug via EPIDURAL
  Filled 2013-05-17: qty 2

## 2013-05-17 MED ORDER — LIDOCAINE HCL (PF) 1 % IJ SOLN
30.0000 mL | INTRAMUSCULAR | Status: DC | PRN
  Filled 2013-05-17: qty 30

## 2013-05-17 MED ORDER — LACTATED RINGERS IV SOLN
INTRAVENOUS | Status: DC
  Administered 2013-05-17 (×3): via INTRAVENOUS
  Administered 2013-05-17: 125 mL/h via INTRAVENOUS

## 2013-05-17 MED ORDER — BENZOCAINE-MENTHOL 20-0.5 % EX AERO: 1.0000 "application " | INHALATION_SPRAY | CUTANEOUS | Status: DC | PRN

## 2013-05-17 MED ORDER — FENTANYL 2.5 MCG/ML BUPIVACAINE 1/10 % EPIDURAL INFUSION (WH - ANES)
INTRAMUSCULAR | Status: DC | PRN
  Administered 2013-05-17: 14 mL/h via EPIDURAL

## 2013-05-17 MED ORDER — ZOLPIDEM TARTRATE 5 MG PO TABS: 5.0000 mg | ORAL_TABLET | Freq: Every evening | ORAL | Status: DC | PRN

## 2013-05-17 MED ORDER — CITRIC ACID-SODIUM CITRATE 334-500 MG/5ML PO SOLN
30.0000 mL | ORAL | Status: DC | PRN
  Filled 2013-05-17: qty 15

## 2013-05-17 MED ORDER — LACTATED RINGERS IV SOLN
500.0000 mL | Freq: Once | INTRAVENOUS | Status: AC
  Administered 2013-05-17: 500 mL via INTRAVENOUS

## 2013-05-17 MED ORDER — FENTANYL 2.5 MCG/ML BUPIVACAINE 1/10 % EPIDURAL INFUSION (WH - ANES)
14.0000 mL/h | INTRAMUSCULAR | Status: DC | PRN
  Filled 2013-05-17: qty 125

## 2013-05-17 MED ORDER — LACTATED RINGERS IV SOLN
INTRAVENOUS | Status: DC
  Administered 2013-05-17: 300 mL via INTRAUTERINE

## 2013-05-17 MED ORDER — PHENYLEPHRINE 40 MCG/ML (10ML) SYRINGE FOR IV PUSH (FOR BLOOD PRESSURE SUPPORT)
80.0000 ug | PREFILLED_SYRINGE | INTRAVENOUS | Status: DC | PRN
  Filled 2013-05-17: qty 2
  Filled 2013-05-17: qty 5

## 2013-05-17 MED ORDER — FENTANYL CITRATE 0.05 MG/ML IJ SOLN: 50.0000 ug | INTRAMUSCULAR | Status: DC | PRN

## 2013-05-17 MED ORDER — LANOLIN HYDROUS EX OINT: TOPICAL_OINTMENT | CUTANEOUS | Status: DC | PRN

## 2013-05-17 MED ORDER — OXYCODONE-ACETAMINOPHEN 5-325 MG PO TABS: 1.0000 | ORAL_TABLET | ORAL | Status: DC | PRN

## 2013-05-17 MED ORDER — ONDANSETRON HCL 4 MG/2ML IJ SOLN: 4.0000 mg | INTRAMUSCULAR | Status: DC | PRN

## 2013-05-17 MED ORDER — ONDANSETRON HCL 4 MG PO TABS: 4.0000 mg | ORAL_TABLET | ORAL | Status: DC | PRN

## 2013-05-17 MED ORDER — EPHEDRINE 5 MG/ML INJ
10.0000 mg | INTRAVENOUS | Status: DC | PRN
  Filled 2013-05-17: qty 2
  Filled 2013-05-17: qty 4

## 2013-05-17 MED ORDER — ACETAMINOPHEN 325 MG PO TABS: 650.0000 mg | ORAL_TABLET | ORAL | Status: DC | PRN

## 2013-05-17 MED ORDER — DIPHENHYDRAMINE HCL 25 MG PO CAPS
25.0000 mg | ORAL_CAPSULE | Freq: Four times a day (QID) | ORAL | Status: DC | PRN
  Filled 2013-05-17: qty 1

## 2013-05-17 MED ORDER — ONDANSETRON HCL 4 MG/2ML IJ SOLN: 4.0000 mg | Freq: Four times a day (QID) | INTRAMUSCULAR | Status: DC | PRN

## 2013-05-17 MED ORDER — TERBUTALINE SULFATE 1 MG/ML IJ SOLN: 0.2500 mg | Freq: Once | INTRAMUSCULAR | Status: DC | PRN

## 2013-05-17 MED ORDER — FLEET ENEMA 7-19 GM/118ML RE ENEM: 1.0000 | ENEMA | RECTAL | Status: DC | PRN

## 2013-05-17 MED ORDER — IBUPROFEN 600 MG PO TABS: 600.0000 mg | ORAL_TABLET | Freq: Four times a day (QID) | ORAL | Status: DC | PRN

## 2013-05-17 MED ORDER — SENNOSIDES-DOCUSATE SODIUM 8.6-50 MG PO TABS
2.0000 | ORAL_TABLET | Freq: Every day | ORAL | Status: DC
  Administered 2013-05-18: 2 via ORAL

## 2013-05-17 MED ORDER — PRENATAL MULTIVITAMIN CH
1.0000 | ORAL_TABLET | Freq: Every day | ORAL | Status: DC
  Administered 2013-05-18: 1 via ORAL
  Filled 2013-05-17: qty 1

## 2013-05-17 MED ORDER — DIPHENHYDRAMINE HCL 50 MG/ML IJ SOLN: 12.5000 mg | INTRAMUSCULAR | Status: DC | PRN

## 2013-05-17 MED ORDER — DIBUCAINE 1 % RE OINT: 1.0000 "application " | TOPICAL_OINTMENT | RECTAL | Status: DC | PRN

## 2013-05-17 MED ORDER — BUPIVACAINE HCL (PF) 0.25 % IJ SOLN
INTRAMUSCULAR | Status: DC | PRN
  Administered 2013-05-17: 5 mL via EPIDURAL

## 2013-05-17 MED ORDER — EPHEDRINE 5 MG/ML INJ
10.0000 mg | INTRAVENOUS | Status: DC | PRN
  Filled 2013-05-17: qty 2

## 2013-05-17 MED ORDER — IBUPROFEN 600 MG PO TABS
600.0000 mg | ORAL_TABLET | Freq: Four times a day (QID) | ORAL | Status: DC
  Administered 2013-05-18 – 2013-05-19 (×6): 600 mg via ORAL
  Filled 2013-05-17 (×6): qty 1

## 2013-05-17 MED ORDER — OXYTOCIN BOLUS FROM INFUSION
500.0000 mL | INTRAVENOUS | Status: DC
  Administered 2013-05-17: 500 mL via INTRAVENOUS

## 2013-05-17 MED ORDER — PHENYLEPHRINE 40 MCG/ML (10ML) SYRINGE FOR IV PUSH (FOR BLOOD PRESSURE SUPPORT)
80.0000 ug | PREFILLED_SYRINGE | INTRAVENOUS | Status: DC | PRN
  Filled 2013-05-17: qty 2

## 2013-05-17 MED ORDER — SIMETHICONE 80 MG PO CHEW: 80.0000 mg | CHEWABLE_TABLET | ORAL | Status: DC | PRN

## 2013-05-17 MED ORDER — MISOPROSTOL 50MCG HALF TABLET
50.0000 ug | ORAL_TABLET | ORAL | Status: DC | PRN
  Administered 2013-05-17: 50 ug via ORAL
  Filled 2013-05-17: qty 1

## 2013-05-17 NOTE — Progress Notes (Signed)
Mikell Camp is a 22 y.o. G1P0 at [redacted]w[redacted]d admitted for induction of labor due to BPP 6/10 (NRNST, breathing).  Subjective: Still not feeling UCs, no c/o  Objective: BP 117/69  Pulse 103  Temp(Src) 98.2 F (36.8 C) (Oral)  Resp 16  Ht 5\' 6"  (1.676 m)  Wt 158 lb (71.668 kg)  BMI 25.51 kg/m2  SpO2 100%  LMP 08/23/2012      FHT:  FHR: 165 bpm, variability: moderate,  accelerations:  Present,  decelerations:  Present variable UC:   irregular, every 2-4 minutes SVE:   1/50/-1/double lumen cervical ripening catheter placed with speculum exam, pt tolerated well Labs: Lab Results  Component Value Date   WBC 12.2* 05/17/2013   HGB 11.5* 05/17/2013   HCT 33.4* 05/17/2013   MCV 91.8 05/17/2013   PLT 211 05/17/2013    Assessment / Plan: IOL d/t BPP 6/10 at term, foley bulb placed for cervical ripening  Fetal Wellbeing:  Category II - strip rev'd with Dr. Erin Fulling Pain Control:  IV meds/epidural PRN I/D:  n/a Anticipated MOD:  NSVD  Saylee Sherrill 05/17/2013, 12:05 PM

## 2013-05-17 NOTE — Progress Notes (Signed)
Daisy Blossom CNM talked with Dr Erin Fulling concerning FMS.  Dr "feels good about FMS at this time"

## 2013-05-17 NOTE — Progress Notes (Signed)
Pincus Badder CNM notified FHR non reassureing, RN unable to given Cytotec.  CNM will come to given Cytotec

## 2013-05-17 NOTE — Progress Notes (Signed)
Dr Adriana Simas to consult with Dr Erin Fulling

## 2013-05-17 NOTE — Progress Notes (Signed)
Foley bulb inserted.

## 2013-05-17 NOTE — Progress Notes (Signed)
Aimee Burns, CNM, notified that the pt's foley bulb came out. Pt does not want SVE at this time. CNM also notified of FHR of 165 with minimal and moderate variability with variables.

## 2013-05-17 NOTE — Progress Notes (Signed)
Aimee Burns is a 22 y.o. G1P0 at [redacted]w[redacted]d admitted for induction of labor due to Non-reactive NST.  Subjective: Comfortable with epidural, called to eval FHR  Objective: BP 126/78  Pulse 105  Temp(Src) 99.7 F (37.6 C) (Axillary)  Resp 20  Ht 5\' 6"  (1.676 m)  Wt 158 lb (71.668 kg)  BMI 25.51 kg/m2  SpO2 100%  LMP 08/23/2012      FHT:  FHR: 160 bpm, variability: minimal ,  accelerations:  Abscent,  decelerations:  Present few small late decels UC:   regular, every 2-3 minutes SVE: 6/80/-1/AROM, particulate mec, FSE and IUPC placed Labs: Lab Results  Component Value Date   WBC 12.2* 05/17/2013   HGB 11.5* 05/17/2013   HCT 33.4* 05/17/2013   MCV 91.8 05/17/2013   PLT 211 05/17/2013    Assessment / Plan: IOL d/t NRNST, progressing well after foley bulb without pitocin  Labor: Progressing normally  Fetal Wellbeing:  Category II - Dr. Erin Fulling aware, internal monitors placed Pain Control:  Epidural I/D:  n/a Anticipated MOD:  NSVD  Aimee Burns 05/17/2013, 7:47 PM

## 2013-05-17 NOTE — Progress Notes (Signed)
Aimee Burns is a 22 y.o. G1P0 at [redacted]w[redacted]d  admitted for IOL for NRNST  Subjective: Feeling more uncomfortable despite epidural, called to eval FHR tracing  Objective: BP 126/78  Pulse 105  Temp(Src) 99.7 F (37.6 C) (Axillary)  Resp 20  Ht 5\' 6"  (1.676 m)  Wt 158 lb (71.668 kg)  BMI 25.51 kg/m2  SpO2 100%  LMP 08/23/2012      FHT:  FHR: 160 bpm, variability: minimal ,  accelerations:  Abscent,  decelerations:  Present recurrent variable UC:   regular, every 2-3 minutes SVE:   Dilation: 7.5 Effacement (%): 90 Station: +2 Exam by:: Dr. Dolan Amen MD  Labs: Lab Results  Component Value Date   WBC 12.2* 05/17/2013   HGB 11.5* 05/17/2013   HCT 33.4* 05/17/2013   MCV 91.8 05/17/2013   PLT 211 05/17/2013    Assessment / Plan: IOL for NRNST, progressing well s/p foley bulb without pitocin  Labor: Progressing normally  Fetal Wellbeing:  Category II - Dr. Erin Fulling to room to eval, checked cervix, will start amnioinfusion for recurrent variables Pain Control:  Epidural - plan to have anesthesia redose epidural  I/D:  n/a Anticipated MOD:  NSVD  [name],[name] 05/17/2013, 7:50 PM

## 2013-05-17 NOTE — MAU Note (Signed)
Pt states she has not felt the baby move since around 1100pm

## 2013-05-17 NOTE — Progress Notes (Signed)
Dr Erin Fulling came to nursing station.  Discussed with RN.  MD states she has been watching the fetal monitor strip and that she is "ok with it" RN stated pt has been having late decels and decreased variability

## 2013-05-17 NOTE — Progress Notes (Signed)
Baker Kogler is a 22 y.o. G1P0 at [redacted]w[redacted]d admitted for induction of labor due to BPP 6/10 (NRNST, breathing).  Subjective: Uncomfortable with frequent contractions.  Objective: BP 114/76  Pulse 72  Temp(Src) 98.2 F (36.8 C) (Oral)  Resp 20  Ht 5\' 6"  (1.676 m)  Wt 158 lb (71.668 kg)  BMI 25.51 kg/m2  SpO2 100%  LMP 08/23/2012      FHT:  FHR: 160 bpm, variability: moderate,  accelerations:  Present,  decelerations:  Present Occasional brief decels noted (late vs variable) UC:   Every 1-2 min SVE:   Dilation: Fingertip Effacement (%): 50 Cervical Position: Posterior Station: -2 Presentation: Vertex Exam by:: Philipp Deputy, CNM  Labs: Lab Results  Component Value Date   WBC 12.2* 05/17/2013   HGB 11.5* 05/17/2013   HCT 33.4* 05/17/2013   MCV 91.8 05/17/2013   PLT 211 05/17/2013    Assessment / Plan: IOL d/t BPP 6/10 at term Foley bulb in place.  Fetal Wellbeing:  Cat. II Pain Control:  IV meds/epidural PRN Anticipated MOD:  NSVD  Everlene Other 05/17/2013, 2:12 PM

## 2013-05-17 NOTE — Progress Notes (Signed)
Aimee Burns is a 22 y.o. G1P0 at [redacted]w[redacted]d admitted for induction of labor due to Non-reactive NST and BPP 6/8.  Subjective: Pt received first dose of Cytotec 50 mcg PO at about 8 AM. Not feeling contractions.   Objective: BP 119/55  Pulse 78  Temp(Src) 97.5 F (36.4 C) (Oral)  Resp 20  Ht 5\' 6"  (1.676 m)  Wt 158 lb (71.668 kg)  BMI 25.51 kg/m2  SpO2 100%  LMP 08/23/2012      FHT:  FHR: 165 bpm, variability: moderate,  accelerations:  Abscent,  decelerations:  Present variable UC:  regular, every 2-3 minutes SVE:   Dilation: Fingertip Effacement (%): 50 Station: -2 Exam by:: Philipp Deputy, CNM Attempted to place foley bulb with digital exam, unsuccessful  Labs: Lab Results  Component Value Date   WBC 12.2* 05/17/2013   HGB 11.5* 05/17/2013   HCT 33.4* 05/17/2013   MCV 91.8 05/17/2013   PLT 211 05/17/2013    Assessment / Plan: IOL d/t BPP 6/10 at term - d/t cytotec at this time d/t frequent contractions, plan to recheck in about 2 hours, place foley bulb at that time if no cervical change   Fetal Wellbeing:  Category II - strip rev'd with Dr Erin Fulling Pain Control:  IV meds/epidural PRN I/D:  n/a Anticipated MOD:  NSVD  Yaman Grauberger 05/17/2013, 11:57 AM

## 2013-05-17 NOTE — H&P (Signed)
Jacquelinne Speak is a 22 y.o. female G1 at 29.3wks presenting for decreased FM x 1 day. Denies leak, bldg or ctx. She receives her [facility] at [facility] and it has been remarkable for onset of care at 27wks. She had a growth U/S recently with EFW 45th%.  History OB History   Grav Para Term Preterm Abortions TAB SAB Ect Mult Living   1              Past Medical History  Diagnosis Date  . Pregnancy   . Thyroid tumor, benign   . Renal calculi     2011,right parathyroidectomy   Past Surgical History  Procedure Laterality Date  . Parathyroid gland removed    . Thyroidectomy, partial Right 2011  . Parathyroidectomy / exploration of parathyroids Right 2011    [place], at age18   Family History: family history includes Cancer in her paternal grandmother; Diabetes in her paternal grandmother; and Thyroid disease in her mother. Social History:  reports that she has never smoked. She does not have any smokeless tobacco history on file. She reports that she does not drink alcohol or use illicit drugs.   Prenatal Transfer Tool  Maternal Diabetes: No Genetic Screening: Declined- too late Maternal Ultrasounds/Referrals: Normal Fetal Ultrasounds or other Referrals:  None Maternal Substance Abuse:  No Significant Maternal Medications:  None Significant Maternal Lab Results:  Lab values include: Group B Strep negative Other Comments:  None  ROS  Dilation: Fingertip Effacement (%): 50 Station: -2 Exam by:: Philipp Deputy, CNM Blood pressure 130/70, pulse 78, temperature 98.4 F (36.9 C), temperature source Oral, resp. rate 18, height 5\' 6"  (1.676 m), weight 71.668 kg (158 lb), last menstrual period 08/23/2012, SpO2 100.00%. Maternal Exam:  Uterine Assessment: Irreg ctx; no pattern; mild     Fetal Exam Fetal Monitor Review: Baseline rate: 155.  Variability: minimal (<5 bpm).   Pattern: no accelerations and variable decelerations.   occ mi variables     Physical Exam  Constitutional:  She is oriented to person, place, and time. She appears well-developed.  HENT:  Head: Normocephalic.  Neck: Normal range of motion.  Cardiovascular: Normal rate.   Respiratory: Effort normal.  Genitourinary: Vagina normal.  Cx post/FT/50/-2/vtx  Musculoskeletal: Normal range of motion.  Neurological: She is alert and oriented to person, place, and time.  Skin: Skin is warm and dry.  Psychiatric: She has a normal mood and affect. Her behavior is normal. Thought content normal.    BPP 6/10 (-2 breathing; -2 NST)  Prenatal labs: ABO, Rh: A/Negative/-- (11/11 0000) Antibody: NEG (04/07 0907) Rubella: Immune (11/11 0000) RPR: NON REAC (04/07 0907)  HBsAg: Negative (11/11 0000)  HIV: NON REACTIVE (04/07 0907)  GBS: NEGATIVE (06/12 1130)   Assessment/Plan: IUP at 37.3wks Non reactive fetal status Unfavorable cx  Admit to [facility]- after prolonged monitoring there is no improvement in FHR; Dr Macon Large consulted and decision made for IOL Will use PO Cytotec for ripening as pt intolerant of exams (foley would be a challenge) Rev'd with pt and mother the status of the FHR and strong possibility that baby may not tolerate labor- they verbalize understanding of risk of urgent C/S   Cam Hai 05/17/2013, 7:19 AM

## 2013-05-17 NOTE — Progress Notes (Signed)
Oxygen off per provider

## 2013-05-17 NOTE — Progress Notes (Signed)
Dr Adriana Simas notified pt having recurrent lates

## 2013-05-17 NOTE — Progress Notes (Signed)
Aimee Burns is a 22 y.o. G1P0 at [redacted]w[redacted]d admitted for induction of labor due to BPP 6/10.  Subjective: Pt comfortable now with epidural, was previously feeling strong contractions.   Objective: BP 134/73  Pulse 85  Temp(Src) 99.7 F (37.6 C) (Axillary)  Resp 18  Ht 5\' 6"  (1.676 m)  Wt 158 lb (71.668 kg)  BMI 25.51 kg/m2  SpO2 100%  LMP 08/23/2012      FHT:  FHR: 160 bpm, variability: minimal-moderate,  accelerations:  Abscent,  decelerations:  Absent UC:   regular, every 2-3 minutes SVE:   Dilation: 5 Effacement (%): 70;80 Station: -2;Ballotable Exam by:: Aimee Burns, CNM  Labs: Lab Results  Component Value Date   WBC 12.2* 05/17/2013   HGB 11.5* 05/17/2013   HCT 33.4* 05/17/2013   MCV 91.8 05/17/2013   PLT 211 05/17/2013    Assessment / Plan: Labor induction with cytotec/foley bulb for BPP 6/10, progressing well  Labor: recheck in 2 hours, if no change, will consider Pitocin  Fetal Wellbeing:  Category II Pain Control:  Epidural I/D:  n/a Anticipated MOD:  NSVD  Aimee Burns 05/17/2013, 6:20 PM

## 2013-05-17 NOTE — MAU Note (Signed)
Pt reports no fetal movement x 1 day.

## 2013-05-17 NOTE — Anesthesia Preprocedure Evaluation (Signed)
Anesthesia Evaluation  Patient identified by MRN, date of birth, ID band Patient awake    Reviewed: Allergy & Precautions, H&P , NPO status , Patient's Chart, lab work & pertinent test results  Airway Mallampati: II TM Distance: >3 FB Neck ROM: full    Dental no notable dental hx.    Pulmonary neg pulmonary ROS,    Pulmonary exam normal       Cardiovascular negative cardio ROS      Neuro/Psych negative neurological ROS  negative psych ROS   GI/Hepatic negative GI ROS, Neg liver ROS,   Endo/Other  negative endocrine ROS  Renal/GU   negative genitourinary   Musculoskeletal negative musculoskeletal ROS (+)   Abdominal Normal abdominal exam  (+)   Peds negative pediatric ROS (+)  Hematology negative hematology ROS (+)   Anesthesia Other Findings   Reproductive/Obstetrics (+) Pregnancy                           Anesthesia Physical Anesthesia Plan  ASA: II  Anesthesia Plan: Epidural   Post-op Pain Management:    Induction:   Airway Management Planned:   Additional Equipment:   Intra-op Plan:   Post-operative Plan:   Informed Consent: I have reviewed the patients History and Physical, chart, labs and discussed the procedure including the risks, benefits and alternatives for the proposed anesthesia with the patient or authorized representative who has indicated his/her understanding and acceptance.     Plan Discussed with:   Anesthesia Plan Comments:         Anesthesia Quick Evaluation

## 2013-05-17 NOTE — Anesthesia Procedure Notes (Signed)
Epidural Patient location during procedure: OB Start time: 05/17/2013 5:04 PM End time: 05/17/2013 5:08 PM  Staffing Anesthesiologist: Sandrea Hughs Performed by: anesthesiologist   Preanesthetic Checklist Completed: patient identified, site marked, surgical consent, pre-op evaluation, timeout performed, IV checked, risks and benefits discussed and monitors and equipment checked  Epidural Patient position: sitting Prep: site prepped and draped and DuraPrep Patient monitoring: continuous pulse ox and blood pressure Approach: midline Injection technique: LOR air  Needle:  Needle type: Tuohy  Needle gauge: 17 G Needle length: 9 cm and 9 Needle insertion depth: 5 cm cm Catheter type: closed end flexible Catheter size: 19 Gauge Catheter at skin depth: 10 cm Test dose: negative and Other  Assessment Sensory level: T9 Events: blood not aspirated, injection not painful, no injection resistance, negative IV test and no paresthesia

## 2013-05-18 MED ORDER — TETANUS-DIPHTH-ACELL PERTUSSIS 5-2.5-18.5 LF-MCG/0.5 IM SUSP
0.5000 mL | Freq: Once | INTRAMUSCULAR | Status: AC
  Administered 2013-05-18: 0.5 mL via INTRAMUSCULAR

## 2013-05-18 NOTE — Progress Notes (Signed)
UR completed 

## 2013-05-18 NOTE — Anesthesia Postprocedure Evaluation (Signed)
  Anesthesia Post-op Note  Patient: Aimee Burns  Procedure(s) Performed: * No procedures listed *  Patient Location: Mother/Baby  Anesthesia Type:Epidural  Level of Consciousness: awake, alert , oriented and patient cooperative  Airway and Oxygen Therapy: Patient Spontanous Breathing  Post-op Pain: mild  Post-op Assessment: Patient's Cardiovascular Status Stable, Respiratory Function Stable, Pain level controlled, No headache, No backache, No residual numbness and No residual motor weakness  Post-op Vital Signs: stable  Complications: No apparent anesthesia complications

## 2013-05-18 NOTE — Progress Notes (Signed)
Post Partum Day 1 Subjective: no complaints, voiding, tolerating PO and + flatus. Complains of some pain to laceration when voiding. No BM yet. Pt is not yet up ad lib. Denies abdominal pain/cramping. Reports some vaginal bleeding. Pt is breastfeeding; reports some difficulty but has been successful. Denies nipple pain.  Objective: Blood pressure 103/56, pulse 79, temperature 97.9 F (36.6 C), temperature source Oral, resp. rate 20, height 5\' 6"  (1.676 m), weight 71.668 kg (158 lb), last menstrual period 08/23/2012, SpO2 99.00%, unknown if currently breastfeeding.  Physical Exam:  General: alert, cooperative and no distress Lochia: appropriate Uterine Fundus: firm Incision: NA DVT Evaluation: No evidence of DVT seen on physical exam. Negative Homan's sign. No cords or calf tenderness. No significant calf/ankle edema.   Recent Labs  05/17/13 0525  HGB 11.5*  HCT 33.4*    Assessment/Plan: Plan for discharge tomorrow, Breastfeeding, Lactation consult and Contraception : planning for Nexplanon at follow-up appointment at [facility]. Continue current postpartum care.   LOS: 1 day   GARLAND, JEANNA 05/18/2013, 7:28 AM  Evaluation and management procedures were performed by PA-S under my supervision/collaboration. Chart reviewed, patient examined by me and I agree with management and plan.  Danae Orleans, CNM 05/18/2013 10:06 AM

## 2013-05-18 NOTE — H&P (Signed)
Attestation of Attending Supervision of Advanced Practitioner (PA/CNM/NP): Evaluation and management procedures were performed by the Advanced Practitioner under my supervision and collaboration.  I have reviewed the Advanced Practitioner's note and chart, and I agree with the management and plan.  Shahab Polhamus, MD, FACOG Attending Obstetrician & Gynecologist Faculty Practice, [facility]  

## 2013-05-19 ENCOUNTER — Ambulatory Visit: Payer: Self-pay

## 2013-05-19 MED ORDER — SENNOSIDES-DOCUSATE SODIUM 8.6-50 MG PO TABS: 2.0000 | ORAL_TABLET | Freq: Every day | ORAL | Status: DC

## 2013-05-19 MED ORDER — IBUPROFEN 600 MG PO TABS: 600.0000 mg | ORAL_TABLET | Freq: Four times a day (QID) | ORAL | Status: DC

## 2013-05-19 NOTE — Lactation Note (Signed)
This note was copied from the chart of [name]. Lactation Consultation Note Baby is asleep at the breast for this consult. Mom unsure how br feeding is going; concerned that baby sleeps a lot; states he took a bottle well and "sometimes does not like the breast". Discussed her baby's gestational age and how he will sleep often. Enc frequent STS and cue based feeding, waking baby at least 8 times a day to feed if he does not wake on his own.  Enc mom to call lactation office if she has any concerns, and to attend the BFSG. Patient Name: [name] [id number] Date: 05/19/2013 Reason for consult: Follow-up assessment   Maternal Data    Feeding Feeding Type: Breast Milk Feeding method: Breast Length of feed: 10 min  LATCH Score/Interventions                      Lactation Tools Discussed/Used     Consult Status Consult Status: Complete    Lenard Forth 05/19/2013, 11:57 AM

## 2013-05-19 NOTE — Discharge Summary (Signed)
Obstetric Discharge Summary Reason for Admission: induction of labor for BPP 6/10 (nonreactive NST & breathing) Prenatal Procedures: ultrasound Intrapartum Procedures: vacuum due to fetal heart rate Postpartum Procedures: none Complications-Operative and Postpartum: none Hemoglobin  Date Value Range Status  05/17/2013 11.5* 12.0 - 15.0 g/dL Final     HCT  Date Value Range Status  05/17/2013 33.4* 36.0 - 46.0 % Final   BP 100/60  Pulse 74  Temp(Src) 97.4 F (36.3 C) (Oral)  Resp 19  Ht 5\' 6"  (1.676 m)  Wt 71.668 kg (158 lb)  BMI 25.51 kg/m2  SpO2 99%  LMP 08/23/2012  [name] was admitted at 66.2 as a G1P0 for BPP 6/10 for induction of labor. She initially presented due to not feeling much fetal movement. Her induction process began with a PO Cytotec and she progressed to having a foley bulb placed and then began laboring spontaneously. She progressed to completely dilated by the evening of 6/16 but due to fetal heart rate concerns had a VAVD which she tolerated well. Today is PPD#2 and she is deemed to have received the full benefit of her hospital stay and she will be discharged home.  Physical Exam:  General: alert and cooperative Lochia: appropriate Uterine Fundus: firm Incision: N/A DVT Evaluation: No evidence of DVT seen on physical exam. No cords or calf tenderness. No significant calf/ankle edema.  Discharge Diagnoses: Term Pregnancy-delivered  Discharge Information: Date: 05/19/2013 Activity: pelvic rest Diet: routine Medications: PNV and Ibuprofen Condition: stable Instructions: refer to practice specific booklet Discharge to: home Follow-up Information   Follow up with [facility]. Call in 6 weeks. (Postpartum Appointment)    Contact information:   [address] [place] [address] [phone]     Patient up ad lib and doing well with no complaints. Plans to breast feed. Nexplanon is planned for contraception. Follow up will occur  at [facility].   Newborn Data: Live born female  Birth Weight: 5 lb 8.2 oz (2500 g) APGAR: 7, 8  Home with mother.  Arther Abbott 05/19/2013, 7:27 AM  I have seen and examined this patient and I agree with the above. Cam Hai 9:54 AM 05/19/2013

## 2013-05-20 ENCOUNTER — Encounter: Payer: Medicaid Other | Admitting: Advanced Practice Midwife

## 2013-07-02 ENCOUNTER — Encounter: Payer: Self-pay | Admitting: Adult Health

## 2013-07-02 ENCOUNTER — Ambulatory Visit (INDEPENDENT_AMBULATORY_CARE_PROVIDER_SITE_OTHER): Payer: Medicaid Other | Admitting: Adult Health

## 2013-07-02 DIAGNOSIS — Z3202 Encounter for pregnancy test, result negative: Secondary | ICD-10-CM

## 2013-07-02 NOTE — Progress Notes (Signed)
Patient ID: Aimee Burns, female   DOB: Jan 03, 1991, 22 y.o.   MRN: [id number] Aimee Burns is a 22 year old white female in for a postpartum visit. Delivery Date:05/17/13 Method of Delivery: vaginal  Sexual Activity since delivery: Yes with condom  Method of Feeding: bottle  Number of weeks bleeding post delivery: 2-3  Review of Systems: patient denies any headaches, blurred vision, shortness of breath, chest pain, abdominal pain, problems with bowel movements, urination, or intercourse.no joint pain,her partner says she is moody, her depression score ws 7 and she says she is not depressed.  Reviewed past medical,surgical, social and family history. Reviewed medications and allergies.  Depression Score: 7 BP 108/80  Ht 5\' 7"  (1.702 m)  Wt 137 lb (62.143 kg)  BMI 21.45 kg/m2  LMP 07/01/2013  Breastfeeding? [name] pregnancy test negative. Pelvic Exam:   External genitalia is normal in appearance.  The vagina is normal in appearance, with period type blood. The cervix is bulbous.  Uterus is felt to be normal size, shape, and contour, well involuted.  No adnexal masses or tenderness noted.   Impression:  Status post delivery, post partum check, depression screening, contraceptive management.   Plan:   No sex  Return in 5 days for nexplanon insertion Note given to return to work 07/05/13

## 2013-07-02 NOTE — Patient Instructions (Addendum)
No sex  Return in 5 days for nexpalnon

## 2013-07-08 ENCOUNTER — Encounter: Payer: Self-pay | Admitting: Adult Health

## 2013-07-08 ENCOUNTER — Ambulatory Visit (INDEPENDENT_AMBULATORY_CARE_PROVIDER_SITE_OTHER): Payer: Medicaid Other | Admitting: Adult Health

## 2013-07-08 VITALS — BP 108/64 | Ht 67.0 in | Wt 133.0 lb

## 2013-07-08 DIAGNOSIS — Z30017 Encounter for initial prescription of implantable subdermal contraceptive: Secondary | ICD-10-CM

## 2013-07-08 DIAGNOSIS — Z3202 Encounter for pregnancy test, result negative: Secondary | ICD-10-CM

## 2013-07-08 HISTORY — DX: Encounter for initial prescription of implantable subdermal contraceptive: Z30.017

## 2013-07-08 NOTE — Progress Notes (Signed)
Subjective:     Patient ID: Aimee Burns, female   DOB: 10/03/1991, 22 y.o.   MRN: [id number]  HPI Aimee Burns is in for nexplanon insertion.  Review of Systems See HPI Reviewed past medical,surgical, social and family history. Reviewed medications and allergies.     Objective:   Physical Exam BP 108/64  Ht 5\' 7"  (1.702 m)  Wt 133 lb (60.328 kg)  BMI 20.83 kg/m2  LMP 07/01/2013  Breastfeeding? [name] pregnancy test negative, consent signed, Left arm cleansed with betadine, and injected with 1.5 cc 2% lidocaine and waited til numb. Nexplanon easily inserted and steri strips applied. Pressure dressing applied.   Easily palpated by pt and provider.  Assessment:     Nexplanon insertion lot [id number] exp 9/16    Plan:     Use condoms x 2-4 weeks, keep clean and dry x 24 hours, no heavy lifting, keep steri strips on x 72 hours, Keep pressure dressing on x 24 hours. Follow up prn problems.   Return in 2 years for pap and 3 years for nexplanon removal

## 2013-07-08 NOTE — Patient Instructions (Addendum)
Use condoms x 2-4  weeks, keep clean and dry x 24 hours, no heavy lifting, keep steri strips on x 72 hours, Keep pressure dressing on x 24 hours. Follow up prn problems. 

## 2013-10-07 ENCOUNTER — Other Ambulatory Visit: Payer: Self-pay

## 2014-09-16 ENCOUNTER — Other Ambulatory Visit: Payer: Self-pay

## 2014-10-03 ENCOUNTER — Encounter: Payer: Self-pay | Admitting: Adult Health

## 2016-07-09 ENCOUNTER — Ambulatory Visit: Payer: Medicaid Other | Admitting: Adult Health

## 2016-07-09 ENCOUNTER — Encounter: Payer: Self-pay | Admitting: *Deleted

## 2016-07-22 ENCOUNTER — Other Ambulatory Visit: Payer: Self-pay | Admitting: Obstetrics & Gynecology

## 2016-07-22 DIAGNOSIS — O3680X0 Pregnancy with inconclusive fetal viability, not applicable or unspecified: Secondary | ICD-10-CM

## 2016-07-24 ENCOUNTER — Other Ambulatory Visit: Payer: Medicaid Other

## 2016-07-24 ENCOUNTER — Ambulatory Visit (INDEPENDENT_AMBULATORY_CARE_PROVIDER_SITE_OTHER): Payer: Medicaid Other

## 2016-07-24 DIAGNOSIS — Z3A08 8 weeks gestation of pregnancy: Secondary | ICD-10-CM | POA: Diagnosis not present

## 2016-07-24 DIAGNOSIS — O3680X0 Pregnancy with inconclusive fetal viability, not applicable or unspecified: Secondary | ICD-10-CM | POA: Diagnosis not present

## 2016-07-24 NOTE — Progress Notes (Signed)
[place] 7+4 wks,single IUP w/ys, pos fht 162 bpm,normal ov's bilat,crl 13.0 mm

## 2016-08-07 ENCOUNTER — Other Ambulatory Visit: Payer: Self-pay | Admitting: Advanced Practice Midwife

## 2016-08-07 ENCOUNTER — Encounter: Payer: Self-pay | Admitting: Advanced Practice Midwife

## 2016-08-07 ENCOUNTER — Ambulatory Visit (INDEPENDENT_AMBULATORY_CARE_PROVIDER_SITE_OTHER): Payer: Medicaid Other | Admitting: Advanced Practice Midwife

## 2016-08-07 VITALS — BP 120/60 | HR 80 | Wt 146.0 lb

## 2016-08-07 DIAGNOSIS — Z8759 Personal history of other complications of pregnancy, childbirth and the puerperium: Secondary | ICD-10-CM | POA: Insufficient documentation

## 2016-08-07 DIAGNOSIS — Z3491 Encounter for supervision of normal pregnancy, unspecified, first trimester: Secondary | ICD-10-CM

## 2016-08-07 DIAGNOSIS — Z118 Encounter for screening for other infectious and parasitic diseases: Secondary | ICD-10-CM

## 2016-08-07 DIAGNOSIS — O09291 Supervision of pregnancy with other poor reproductive or obstetric history, first trimester: Secondary | ICD-10-CM | POA: Diagnosis not present

## 2016-08-07 DIAGNOSIS — Z331 Pregnant state, incidental: Secondary | ICD-10-CM

## 2016-08-07 DIAGNOSIS — Z369 Encounter for antenatal screening, unspecified: Secondary | ICD-10-CM

## 2016-08-07 DIAGNOSIS — Z1389 Encounter for screening for other disorder: Secondary | ICD-10-CM

## 2016-08-07 DIAGNOSIS — Z1159 Encounter for screening for other viral diseases: Secondary | ICD-10-CM

## 2016-08-07 DIAGNOSIS — Z0283 Encounter for blood-alcohol and blood-drug test: Secondary | ICD-10-CM

## 2016-08-07 DIAGNOSIS — Z3490 Encounter for supervision of normal pregnancy, unspecified, unspecified trimester: Secondary | ICD-10-CM | POA: Insufficient documentation

## 2016-08-07 DIAGNOSIS — Z3682 Encounter for antenatal screening for nuchal translucency: Secondary | ICD-10-CM

## 2016-08-07 LAB — POCT URINALYSIS DIPSTICK
Blood, UA: NEGATIVE
Glucose, UA: NEGATIVE
Ketones, UA: NEGATIVE
LEUKOCYTES UA: NEGATIVE
Nitrite, UA: NEGATIVE
PROTEIN UA: NEGATIVE

## 2016-08-07 NOTE — Patient Instructions (Signed)
 First Trimester of Pregnancy The first trimester of pregnancy is from week 1 until the end of week 12 (months 1 through 3). A week after a sperm fertilizes an egg, the egg will implant on the wall of the uterus. This embryo will begin to develop into a baby. Genes from you and your partner are forming the baby. The female genes determine whether the baby is a boy or a girl. At 6-8 weeks, the eyes and face are formed, and the heartbeat can be seen on ultrasound. At the end of 12 weeks, all the baby's organs are formed.  Now that you are pregnant, you will want to do everything you can to have a healthy baby. Two of the most important things are to get good prenatal care and to follow your health care provider's instructions. Prenatal care is all the medical care you receive before the baby's birth. This care will help prevent, find, and treat any problems during the pregnancy and childbirth. BODY CHANGES Your body goes through many changes during pregnancy. The changes vary from woman to woman.   You may gain or lose a couple of pounds at first.  You may feel sick to your stomach (nauseous) and throw up (vomit). If the vomiting is uncontrollable, call your health care provider.  You may tire easily.  You may develop headaches that can be relieved by medicines approved by your health care provider.  You may urinate more often. Painful urination may mean you have a bladder infection.  You may develop heartburn as a result of your pregnancy.  You may develop constipation because certain hormones are causing the muscles that push waste through your intestines to slow down.  You may develop hemorrhoids or swollen, bulging veins (varicose veins).  Your breasts may begin to grow larger and become tender. Your nipples may stick out more, and the tissue that surrounds them (areola) may become darker.  Your gums may bleed and may be sensitive to brushing and flossing.  Dark spots or blotches  (chloasma, mask of pregnancy) may develop on your face. This will likely fade after the baby is born.  Your menstrual periods will stop.  You may have a loss of appetite.  You may develop cravings for certain kinds of food.  You may have changes in your emotions from day to day, such as being excited to be pregnant or being concerned that something may go wrong with the pregnancy and baby.  You may have more vivid and strange dreams.  You may have changes in your hair. These can include thickening of your hair, rapid growth, and changes in texture. Some women also have hair loss during or after pregnancy, or hair that feels dry or thin. Your hair will most likely return to normal after your baby is born. WHAT TO EXPECT AT YOUR PRENATAL VISITS During a routine prenatal visit:  You will be weighed to make sure you and the baby are growing normally.  Your blood pressure will be taken.  Your abdomen will be measured to track your baby's growth.  The fetal heartbeat will be listened to starting around week 10 or 12 of your pregnancy.  Test results from any previous visits will be discussed. Your health care provider may ask you:  How you are feeling.  If you are feeling the baby move.  If you have had any abnormal symptoms, such as leaking fluid, bleeding, severe headaches, or abdominal cramping.  If you have any questions. Other   tests that may be performed during your first trimester include:  Blood tests to find your blood type and to check for the presence of any previous infections. They will also be used to check for low iron levels (anemia) and Rh antibodies. Later in the pregnancy, blood tests for diabetes will be done along with other tests if problems develop.  Urine tests to check for infections, diabetes, or protein in the urine.  An ultrasound to confirm the proper growth and development of the baby.  An amniocentesis to check for possible genetic problems.  Fetal  screens for spina bifida and Down syndrome.  You may need other tests to make sure you and the baby are doing well. HOME CARE INSTRUCTIONS  Medicines  Follow your health care provider's instructions regarding medicine use. Specific medicines may be either safe or unsafe to take during pregnancy.  Take your prenatal vitamins as directed.  If you develop constipation, try taking a stool softener if your health care provider approves. Diet  Eat regular, well-balanced meals. Choose a variety of foods, such as meat or vegetable-based protein, fish, milk and low-fat dairy products, vegetables, fruits, and whole grain breads and cereals. Your health care provider will help you determine the amount of weight gain that is right for you.  Avoid raw meat and uncooked cheese. These carry germs that can cause birth defects in the baby.  Eating four or five small meals rather than three large meals a day may help relieve nausea and vomiting. If you start to feel nauseous, eating a few soda crackers can be helpful. Drinking liquids between meals instead of during meals also seems to help nausea and vomiting.  If you develop constipation, eat more high-fiber foods, such as fresh vegetables or fruit and whole grains. Drink enough fluids to keep your urine clear or pale yellow. Activity and Exercise  Exercise only as directed by your health care provider. Exercising will help you:  Control your weight.  Stay in shape.  Be prepared for labor and delivery.  Experiencing pain or cramping in the lower abdomen or low back is a good sign that you should stop exercising. Check with your health care provider before continuing normal exercises.  Try to avoid standing for long periods of time. Move your legs often if you must stand in one place for a long time.  Avoid heavy lifting.  Wear low-heeled shoes, and practice good posture.  You may continue to have sex unless your health care provider directs you  otherwise. Relief of Pain or Discomfort  Wear a good support bra for breast tenderness.   Take warm sitz baths to soothe any pain or discomfort caused by hemorrhoids. Use hemorrhoid cream if your health care provider approves.   Rest with your legs elevated if you have leg cramps or low back pain.  If you develop varicose veins in your legs, wear support hose. Elevate your feet for 15 minutes, 3-4 times a day. Limit salt in your diet. Prenatal Care  Schedule your prenatal visits by the twelfth week of pregnancy. They are usually scheduled monthly at first, then more often in the last 2 months before delivery.  Write down your questions. Take them to your prenatal visits.  Keep all your prenatal visits as directed by your health care provider. Safety  Wear your seat belt at all times when driving.  Make a list of emergency phone numbers, including numbers for family, friends, the hospital, and police and fire departments. General   Tips  Ask your health care provider for a referral to a local prenatal education class. Begin classes no later than at the beginning of month 6 of your pregnancy.  Ask for help if you have counseling or nutritional needs during pregnancy. Your health care provider can offer advice or refer you to specialists for help with various needs.  Do not use hot tubs, steam rooms, or saunas.  Do not douche or use tampons or scented sanitary pads.  Do not cross your legs for long periods of time.  Avoid cat litter boxes and soil used by cats. These carry germs that can cause birth defects in the baby and possibly loss of the fetus by miscarriage or stillbirth.  Avoid all smoking, herbs, alcohol, and medicines not prescribed by your health care provider. Chemicals in these affect the formation and growth of the baby.  Schedule a dentist appointment. At home, brush your teeth with a soft toothbrush and be gentle when you floss. SEEK MEDICAL CARE IF:   You have  dizziness.  You have mild pelvic cramps, pelvic pressure, or nagging pain in the abdominal area.  You have persistent nausea, vomiting, or diarrhea.  You have a bad smelling vaginal discharge.  You have pain with urination.  You notice increased swelling in your face, hands, legs, or ankles. SEEK IMMEDIATE MEDICAL CARE IF:   You have a fever.  You are leaking fluid from your vagina.  You have spotting or bleeding from your vagina.  You have severe abdominal cramping or pain.  You have rapid weight gain or loss.  You vomit blood or material that looks like coffee grounds.  You are exposed to German measles and have never had them.  You are exposed to fifth disease or chickenpox.  You develop a severe headache.  You have shortness of breath.  You have any kind of trauma, such as from a fall or a car accident. Document Released: 11/12/2001 Document Revised: 04/04/2014 Document Reviewed: 09/28/2013 Exit[employer] Patient Information 2015 [employer]. This information is not intended to replace advice given to you by your health care provider. Make sure you discuss any questions you have with your health care provider.   Nausea & Vomiting  Have saltine crackers or pretzels by your bed and eat a few bites before you raise your head out of bed in the morning  Eat small frequent meals throughout the day instead of large meals  Drink plenty of fluids throughout the day to stay hydrated, just don't drink a lot of fluids with your meals.  This can make your stomach fill up faster making you feel sick  Do not brush your teeth right after you eat  Products with real ginger are good for nausea, like ginger ale and ginger hard candy Make sure it says made with real ginger!  Sucking on sour candy like lemon heads is also good for nausea  If your prenatal vitamins make you nauseated, take them at night so you will sleep through the nausea  Sea Bands  If you feel like you need  medicine for the nausea & vomiting please let us know  If you are unable to keep any fluids or food down please let us know   Constipation  Drink plenty of fluid, preferably water, throughout the day  Eat foods high in fiber such as fruits, vegetables, and grains  Exercise, such as walking, is a good way to keep your bowels regular  Drink warm fluids, especially warm   prune juice, or decaf coffee  Eat a 1/2 cup of real oatmeal (not instant), 1/2 cup applesauce, and 1/2-1 cup warm prune juice every day  If needed, you may take Colace (docusate sodium) stool softener once or twice a day to help keep the stool soft. If you are pregnant, wait until you are out of your first trimester (12-14 weeks of pregnancy)  If you still are having problems with constipation, you may take Miralax once daily as needed to help keep your bowels regular.  If you are pregnant, wait until you are out of your first trimester (12-14 weeks of pregnancy)  Safe Medications in Pregnancy   Acne: Benzoyl Peroxide Salicylic Acid  Backache/Headache: Tylenol: 2 regular strength every 4 hours OR              2 Extra strength every 6 hours  Colds/Coughs/Allergies: Benadryl (alcohol free) 25 mg every 6 hours as needed Breath right strips Claritin Cepacol throat lozenges Chloraseptic throat spray Cold-Eeze- up to three times per day Cough drops, alcohol free Flonase (by prescription only) Guaifenesin Mucinex Robitussin DM (plain only, alcohol free) Saline nasal spray/drops Sudafed (pseudoephedrine) & Actifed ** use only after [redacted] weeks gestation and if you do not have high blood pressure Tylenol Vicks Vaporub Zinc lozenges Zyrtec   Constipation: Colace Ducolax suppositories Fleet enema Glycerin suppositories Metamucil Milk of magnesia Miralax Senokot Smooth move tea  Diarrhea: Kaopectate Imodium A-D  *NO pepto Bismol  Hemorrhoids: Anusol Anusol HC Preparation  H Tucks  Indigestion: Tums Maalox Mylanta Zantac  Pepcid  Insomnia: Benadryl (alcohol free) 25mg every 6 hours as needed Tylenol PM Unisom, no Gelcaps  Leg Cramps: Tums MagGel  Nausea/Vomiting:  Bonine Dramamine Emetrol Ginger extract Sea bands Meclizine  Nausea medication to take during pregnancy:  Unisom (doxylamine succinate 25 mg tablets) Take one tablet daily at bedtime. If symptoms are not adequately controlled, the dose can be increased to a maximum recommended dose of two tablets daily (1/2 tablet in the morning, 1/2 tablet mid-afternoon and one at bedtime). Vitamin B6 100mg tablets. Take one tablet twice a day (up to 200 mg per day).  Skin Rashes: Aveeno products Benadryl cream or 25mg every 6 hours as needed Calamine Lotion 1% cortisone cream  Yeast infection: Gyne-lotrimin 7 Monistat 7   **If taking multiple medications, please check labels to avoid duplicating the same active ingredients **take medication as directed on the label ** Do not exceed 4000 mg of tylenol in 24 hours **Do not take medications that contain aspirin or ibuprofen      

## 2016-08-07 NOTE — Addendum Note (Signed)
Addended by: Diona Fanti A on: 08/07/2016 12:11 PM   Modules accepted: Orders

## 2016-08-07 NOTE — Progress Notes (Signed)
  Subjective:    Aimee Burns is a G2P1001 [redacted]w[redacted]d being seen today for her first obstetrical visit.  Her obstetrical history is significant for IUGR (5#8oz).  Pregnancy history fully reviewed.  Patient reports no complaints.  Vitals:   08/07/16 1047  BP: 120/60  Pulse: 80  Weight: 146 lb (66.2 kg)    HISTORY: OB History  Gravida Para Term Preterm AB Living  2 1 1     1   SAB TAB Ectopic Multiple Live Births          1    # Outcome Date GA Lbr Len/2nd Weight Sex Delivery Anes PTL Lv  2 Current           1 Term 05/17/13 [redacted]w[redacted]d 22:15 / 00:39 5 lb 8.2 oz (2.5 kg) M Vag-Vacuum EPI  LIV     Past Medical History:  Diagnosis Date  . Nexplanon insertion 07/08/2013   Placed in left arm 07/08/13 remove 07/08/16  . Pregnancy   . Renal calculi    2011,right parathyroidectomy  . Thyroid tumor, benign    Past Surgical History:  Procedure Laterality Date  . Parathyroid gland removed    . PARATHYROIDECTOMY / EXPLORATION OF PARATHYROIDS Right 2011   [place], at [place]  . THYROIDECTOMY, PARTIAL Right 2011   Family History  Problem Relation Age of Onset  . Diabetes Paternal Grandmother   . Cancer Paternal Grandmother     lung  . Thyroid disease Mother      Exam       Pelvic Exam:    Perineum: Normal Perineum   Vulva: normal   Vagina:  normal mucosa, normal discharge, no palpable nodules   Uterus Normal, Gravid, FH: 9     Cervix: normal   Adnexa: Not palpable   Urinary:  urethral meatus normal    System:     Skin: normal coloration and turgor, no rashes    Neurologic: oriented, normal, normal mood   Extremities: normal strength, tone, and muscle mass   HEENT PERRLA   Mouth/Teeth mucous membranes moist, n dentition   Neck supple and no masses   Cardiovascular: regular rate and rhythm   Respiratory:  appears well, vitals normal, no respiratory distress, acyanotic   Abdomen: soft, non-tender;  FHR: 160US          Assessment:    Pregnancy: G2P1001 Patient  Active Problem List   Diagnosis Date Noted  . Supervision of normal pregnancy 08/07/2016  . Nexplanon insertion 07/08/2013  . Uterine size date discrepancy 04/20/2013  . Renal calculi 02/11/2013  . Thyroid tumor, benign 02/11/2013        Plan:     Initial labs drawn. Continue prenatal vitamins  Problem list reviewed and updated  Reviewed n/v relief measures and warning s/s to report  Reviewed recommended weight gain based on pre-gravid BMI  Encouraged well-balanced diet Genetic Screening discussed Integrated Screen: requested.  Ultrasound discussed; fetal survey: requested.  Return in about 3 weeks (around 08/28/2016) for LROB, US:NT+1st IT.  CRESENZO-DISHMAN,Loranda Mastel 08/07/2016

## 2016-08-08 ENCOUNTER — Telehealth: Payer: Self-pay | Admitting: Advanced Practice Midwife

## 2016-08-08 LAB — GC/CHLAMYDIA PROBE AMP
CHLAMYDIA, DNA PROBE: NEGATIVE
NEISSERIA GONORRHOEAE BY PCR: NEGATIVE

## 2016-08-08 NOTE — Telephone Encounter (Signed)
C/o vaginal itching. Pt advised to use OTC Monistat per Dr. Glo Herring for yeast. Pt also informed urine sent for urine culture yesterday will take 5-7 days for results. Pt verbalized understanding.

## 2016-08-09 LAB — URINE CULTURE

## 2016-08-13 LAB — CBC
HEMATOCRIT: 37.1 % (ref 34.0–46.6)
HEMOGLOBIN: 12.4 g/dL (ref 11.1–15.9)
MCH: 29.2 pg (ref 26.6–33.0)
MCHC: 33.4 g/dL (ref 31.5–35.7)
MCV: 87 fL (ref 79–97)
Platelets: 268 10*3/uL (ref 150–379)
RBC: 4.25 x10E6/uL (ref 3.77–5.28)
RDW: 14.1 % (ref 12.3–15.4)
WBC: 8.5 10*3/uL (ref 3.4–10.8)

## 2016-08-13 LAB — PMP SCREEN PROFILE (10S), URINE
AMPHETAMINE SCRN UR: NEGATIVE ng/mL
Barbiturate Screen, Ur: NEGATIVE ng/mL
Benzodiazepine Screen, Urine: NEGATIVE ng/mL
CANNABINOIDS UR QL SCN: NEGATIVE ng/mL
CREATININE(CRT), U: 159.8 mg/dL (ref 20.0–300.0)
Cocaine(Metab.)Screen, Urine: NEGATIVE ng/mL
Methadone Scn, Ur: NEGATIVE ng/mL
OPIATE SCRN UR: NEGATIVE ng/mL
OXYCODONE+OXYMORPHONE UR QL SCN: NEGATIVE ng/mL
PCP SCRN UR: NEGATIVE ng/mL
PH UR, DRUG SCRN: 6.2 (ref 4.5–8.9)
Propoxyphene, Screen: NEGATIVE ng/mL

## 2016-08-13 LAB — URINALYSIS, ROUTINE W REFLEX MICROSCOPIC
BILIRUBIN UA: NEGATIVE
Glucose, UA: NEGATIVE
Ketones, UA: NEGATIVE
Leukocytes, UA: NEGATIVE
Nitrite, UA: NEGATIVE
PH UA: 6.5 (ref 5.0–7.5)
PROTEIN UA: NEGATIVE
RBC UA: NEGATIVE
Specific Gravity, UA: 1.022 (ref 1.005–1.030)
UUROB: 0.2 mg/dL (ref 0.2–1.0)

## 2016-08-13 LAB — HEPATITIS B SURFACE ANTIGEN: HEP B S AG: NEGATIVE

## 2016-08-13 LAB — VARICELLA ZOSTER ANTIBODY, IGG: VARICELLA: 422 {index} (ref >165)

## 2016-08-13 LAB — ANTIBODY SCREEN: ANTIBODY SCREEN: NEGATIVE

## 2016-08-13 LAB — RPR: RPR Ser Ql: NONREACTIVE

## 2016-08-13 LAB — CYSTIC FIBROSIS MUTATION 97: Interpretation: NOT DETECTED

## 2016-08-26 ENCOUNTER — Other Ambulatory Visit: Payer: Medicaid Other

## 2016-08-27 ENCOUNTER — Ambulatory Visit (INDEPENDENT_AMBULATORY_CARE_PROVIDER_SITE_OTHER): Payer: Medicaid Other | Admitting: Obstetrics & Gynecology

## 2016-08-27 ENCOUNTER — Encounter: Payer: Self-pay | Admitting: Obstetrics & Gynecology

## 2016-08-27 ENCOUNTER — Ambulatory Visit (INDEPENDENT_AMBULATORY_CARE_PROVIDER_SITE_OTHER): Payer: BLUE CROSS/BLUE SHIELD

## 2016-08-27 VITALS — Wt 142.0 lb

## 2016-08-27 DIAGNOSIS — Z36 Encounter for antenatal screening of mother: Secondary | ICD-10-CM

## 2016-08-27 DIAGNOSIS — Z369 Encounter for antenatal screening, unspecified: Secondary | ICD-10-CM

## 2016-08-27 DIAGNOSIS — Z331 Pregnant state, incidental: Secondary | ICD-10-CM

## 2016-08-27 DIAGNOSIS — Z3A13 13 weeks gestation of pregnancy: Secondary | ICD-10-CM | POA: Diagnosis not present

## 2016-08-27 DIAGNOSIS — Z3682 Encounter for antenatal screening for nuchal translucency: Secondary | ICD-10-CM

## 2016-08-27 DIAGNOSIS — Z1389 Encounter for screening for other disorder: Secondary | ICD-10-CM

## 2016-08-27 DIAGNOSIS — O09291 Supervision of pregnancy with other poor reproductive or obstetric history, first trimester: Secondary | ICD-10-CM

## 2016-08-27 DIAGNOSIS — Z3491 Encounter for supervision of normal pregnancy, unspecified, first trimester: Secondary | ICD-10-CM

## 2016-08-27 DIAGNOSIS — Z3492 Encounter for supervision of normal pregnancy, unspecified, second trimester: Secondary | ICD-10-CM

## 2016-08-27 LAB — POCT URINALYSIS DIPSTICK
Blood, UA: NEGATIVE
GLUCOSE UA: NEGATIVE
Ketones, UA: NEGATIVE
LEUKOCYTES UA: NEGATIVE
NITRITE UA: NEGATIVE
Protein, UA: NEGATIVE

## 2016-08-27 NOTE — Progress Notes (Signed)
[place] 12+3 wks,measurements c/w dates,NB present,NT 1.4 mm,normal ov's bilat,crl 63.6 mm,fhr 167 bpm

## 2016-08-27 NOTE — Progress Notes (Signed)
G2P1001 [redacted]w[redacted]d Estimated Date of Delivery: 03/08/17  Weight 142 lb (64.4 kg).   BP weight and urine results all reviewed and noted.  Please refer to the obstetrical flow sheet for the fundal height and fetal heart rate documentation:  Patient reports good fetal movement, denies any bleeding and no rupture of membranes symptoms or regular contractions. Patient is without complaints. All questions were answered.  Orders Placed This Encounter  Procedures  . Maternal Screen, Integrated #1  . HIV antibody  . POCT urinalysis dipstick    Plan:  Continued routine obstetrical care, NT normal  No Follow-up on file.

## 2016-08-30 ENCOUNTER — Other Ambulatory Visit: Payer: Medicaid Other

## 2016-08-30 LAB — MATERNAL SCREEN, INTEGRATED #1
CROWN RUMP LENGTH MAT SCREEN: 63.6 mm
GEST. AGE ON COLLECTION DATE: 12.7 wk
Maternal Age at EDD: 23.5 years
NUCHAL TRANSLUCENCY (NT): 1.4 mm
NUMBER OF FETUSES: 1
PAPP-A VALUE: 663.8 ng/mL
WEIGHT: 142 [lb_av]

## 2016-08-30 LAB — HIV ANTIBODY (ROUTINE TESTING W REFLEX): HIV SCREEN 4TH GENERATION: NONREACTIVE

## 2016-09-23 ENCOUNTER — Ambulatory Visit (INDEPENDENT_AMBULATORY_CARE_PROVIDER_SITE_OTHER): Payer: Medicaid Other | Admitting: Women's Health

## 2016-09-23 VITALS — BP 100/52 | HR 82 | Wt 142.2 lb

## 2016-09-23 DIAGNOSIS — Z331 Pregnant state, incidental: Secondary | ICD-10-CM

## 2016-09-23 DIAGNOSIS — Z3482 Encounter for supervision of other normal pregnancy, second trimester: Secondary | ICD-10-CM

## 2016-09-23 DIAGNOSIS — Z3A17 17 weeks gestation of pregnancy: Secondary | ICD-10-CM

## 2016-09-23 DIAGNOSIS — Z363 Encounter for antenatal screening for malformations: Secondary | ICD-10-CM

## 2016-09-23 DIAGNOSIS — Z3682 Encounter for antenatal screening for nuchal translucency: Secondary | ICD-10-CM

## 2016-09-23 DIAGNOSIS — Z1389 Encounter for screening for other disorder: Secondary | ICD-10-CM

## 2016-09-23 LAB — POCT URINALYSIS DIPSTICK
Blood, UA: NEGATIVE
GLUCOSE UA: NEGATIVE
Ketones, UA: NEGATIVE
LEUKOCYTES UA: NEGATIVE
NITRITE UA: NEGATIVE
Protein, UA: NEGATIVE

## 2016-09-23 NOTE — Patient Instructions (Signed)

## 2016-09-23 NOTE — Progress Notes (Signed)
Low-risk OB appointment G2P1001 [redacted]w[redacted]d Estimated Date of Delivery: 03/08/17 BP (!) 100/52   Pulse 82   Wt 142 lb 3.2 oz (64.5 kg)   LMP  (LMP Unknown)   BMI 22.27 kg/m   BP, weight, and urine reviewed.  Refer to obstetrical flow sheet for FH & FHR.  No fm yet. Denies cramping, lof, vb, or uti s/s. No complaints. Reviewed warning s/s to report. Plan:  Continue routine obstetrical care  F/U in 4wks for OB appointment and anatomy u/s Declined flu shot 2nd IT today

## 2016-09-24 ENCOUNTER — Encounter: Payer: Medicaid Other | Admitting: Advanced Practice Midwife

## 2016-09-25 ENCOUNTER — Telehealth: Payer: Self-pay | Admitting: *Deleted

## 2016-09-25 LAB — MATERNAL SCREEN, INTEGRATED #2
AFP MARKER: 24.1 ng/mL
AFP MoM: 0.7
CROWN RUMP LENGTH: 63.6 mm
DIA MOM: 0.8
DIA VALUE: 144.4 pg/mL
ESTRIOL UNCONJUGATED: 0.67 ng/mL
GESTATIONAL AGE: 16.6 wk
Gest. Age on Collection Date: 12.7 weeks
Maternal Age at EDD: 23.5 years
NUCHAL TRANSLUCENCY (NT): 1.4 mm
NUCHAL TRANSLUCENCY MOM: 0.87
Number of Fetuses: 1
PAPP-A MOM: 0.6
PAPP-A Value: 663.8 ng/mL
TEST RESULTS: NEGATIVE
Weight: 142 [lb_av]
Weight: 142 [lb_av]
hCG MoM: 0.67
hCG Value: 21 IU/mL
uE3 MoM: 0.72

## 2016-09-25 NOTE — Telephone Encounter (Signed)
She can take immodium for the diarrhea, drink gatorade or pedialyte

## 2016-09-25 NOTE — Telephone Encounter (Signed)
Called to inform that she may take immodium for the diarrhea and to drink gatorade or pedialyte to prevent dehydration. Patient states she has been able to eat a little and is starting to feel better. No further questions. Advised to call if further problems or questions.

## 2016-09-25 NOTE — Telephone Encounter (Signed)
Patient called stating she has had diarrhea x4 since 3am. She has had chills, nausea, headache and stuffy nose. She has sinus pressure in her nasal area and under her eyes. Advised to push fluids. Any other suggestions please advise.

## 2016-09-28 ENCOUNTER — Encounter (HOSPITAL_COMMUNITY): Payer: Self-pay | Admitting: *Deleted

## 2016-09-28 ENCOUNTER — Inpatient Hospital Stay (HOSPITAL_COMMUNITY)
Admission: AD | Admit: 2016-09-28 | Discharge: 2016-09-28 | Disposition: A | Discharge status: Home / Self Care | Payer: Medicaid Other | Source: Ambulatory Visit | Attending: Obstetrics & Gynecology | Admitting: Obstetrics & Gynecology

## 2016-09-28 DIAGNOSIS — Z880 Allergy status to penicillin: Secondary | ICD-10-CM | POA: Insufficient documentation

## 2016-09-28 DIAGNOSIS — Z3A17 17 weeks gestation of pregnancy: Secondary | ICD-10-CM | POA: Insufficient documentation

## 2016-09-28 DIAGNOSIS — Z3482 Encounter for supervision of other normal pregnancy, second trimester: Secondary | ICD-10-CM | POA: Insufficient documentation

## 2016-09-28 DIAGNOSIS — O09292 Supervision of pregnancy with other poor reproductive or obstetric history, second trimester: Secondary | ICD-10-CM | POA: Diagnosis not present

## 2016-09-28 DIAGNOSIS — O09299 Supervision of pregnancy with other poor reproductive or obstetric history, unspecified trimester: Secondary | ICD-10-CM

## 2016-09-28 DIAGNOSIS — Z87442 Personal history of urinary calculi: Secondary | ICD-10-CM | POA: Diagnosis not present

## 2016-09-28 DIAGNOSIS — O4692 Antepartum hemorrhage, unspecified, second trimester: Secondary | ICD-10-CM | POA: Diagnosis not present

## 2016-09-28 DIAGNOSIS — O26892 Other specified pregnancy related conditions, second trimester: Secondary | ICD-10-CM | POA: Diagnosis present

## 2016-09-28 LAB — URINALYSIS, ROUTINE W REFLEX MICROSCOPIC
BILIRUBIN URINE: NEGATIVE
Glucose, UA: NEGATIVE mg/dL
KETONES UR: NEGATIVE mg/dL
NITRITE: NEGATIVE
PROTEIN: NEGATIVE mg/dL
Specific Gravity, Urine: 1.02 (ref 1.005–1.030)
pH: 6 (ref 5.0–8.0)

## 2016-09-28 LAB — URINE MICROSCOPIC-ADD ON

## 2016-09-28 NOTE — MAU Note (Signed)
At work and went to bathroom wiped and saw blood light brown bleeding  period .  But when she wiped here no blood.

## 2016-09-28 NOTE — MAU Provider Note (Signed)
History   G2 p0010 @ 17 wks was at work went to the /br and when she wiped she noticed blood on the tissue. No active bleeding at present. States last sex Thursday night. cenies pain or ROM  CSN: [id number]  Arrival date & time 09/28/16  1505   First Provider Initiated Contact with Patient 09/28/16 1528      No chief complaint on file.   HPI  Past Medical History:  Diagnosis Date  . Nexplanon insertion 07/08/2013   Placed in left arm 07/08/13 remove 07/08/16  . Pregnancy   . Renal calculi    2011,right parathyroidectomy  . Thyroid tumor, benign     Past Surgical History:  Procedure Laterality Date  . Parathyroid gland removed    . PARATHYROIDECTOMY / EXPLORATION OF PARATHYROIDS Right 2011   [place], at [place]  . THYROIDECTOMY, PARTIAL Right 2011    Family History  Problem Relation Age of Onset  . Diabetes Paternal Grandmother   . Cancer Paternal Grandmother     lung  . Thyroid disease Mother     Social History  Substance Use Topics  . Smoking status: Never Smoker  . Smokeless tobacco: Never Used  . Alcohol use No    OB History    Gravida Para Term Preterm AB Living   2 1 1     1    SAB TAB Ectopic Multiple Live Births           1      Review of Systems  Constitutional: Negative.   HENT: Negative.   Eyes: Negative.   Respiratory: Negative.   Cardiovascular: Negative.   Gastrointestinal: Negative.   Endocrine: Negative.   Genitourinary: Positive for vaginal bleeding.  Musculoskeletal: Negative.   Skin: Negative.   Allergic/Immunologic: Negative.   Neurological: Negative.   Hematological: Negative.   Psychiatric/Behavioral: Negative.     Allergies  Amoxicillin  Home Medications    BP 130/72 (BP Location: Right Arm)   Pulse 99   Temp 98.1 F (36.7 C)   Resp 18   Ht 5\' 6"  (1.676 m)   Wt 142 lb (64.4 kg)   LMP  (LMP Unknown)   BMI 22.92 kg/m   Physical Exam  Constitutional: She is oriented to person, place, and time. She appears  well-developed and well-nourished.  HENT:  Head: Normocephalic.  Eyes: Pupils are equal, round, and reactive to light.  Neck: Normal range of motion.  Cardiovascular: Normal rate, regular rhythm, normal heart sounds and intact distal pulses.   Pulmonary/Chest: Effort normal and breath sounds normal.  Abdominal: Soft. Bowel sounds are normal.  Genitourinary: Vagina normal and uterus normal.  Musculoskeletal: Normal range of motion.  Neurological: She is alert and oriented to person, place, and time. She has normal reflexes.  Skin: Skin is warm and dry.  Psychiatric: She has a normal mood and affect. Her behavior is normal. Judgment and thought content normal.    MAU Course  Procedures (including critical care time)  Labs Reviewed  URINALYSIS, [place] (NOT AT [facility]) - Abnormal; Notable for the following:       Result Value   Hgb urine dipstick TRACE (*)    Leukocytes, UA TRACE (*)    All other components within normal limits  URINE MICROSCOPIC-ADD ON - Abnormal; Notable for the following:    Squamous Epithelial / LPF 0-5 (*)    Bacteria, UA RARE (*)    All other components within normal limits   No results found.  1. Encounter for supervision of other normal pregnancy in second trimester   2. History of IUGR (intrauterine growth retardation) and stillbirth, currently pregnant       MDM  Post coital bleeding. SVE firm/cl/post/high. No bleeding noted with exam, no bleeding noted on exam glove. Lengthy discussion on post coital bleeding. Pt verbalized understanding. Will d/c home

## 2016-10-14 ENCOUNTER — Telehealth: Payer: Self-pay | Admitting: Obstetrics & Gynecology

## 2016-10-14 NOTE — Telephone Encounter (Signed)
Pt c/o small area of rash with itching. Pt advised can try OTC Hydrocortisone cream if no improvement call our office back. Pt verbalized understanding.

## 2016-10-23 ENCOUNTER — Ambulatory Visit (INDEPENDENT_AMBULATORY_CARE_PROVIDER_SITE_OTHER): Payer: Medicaid Other | Admitting: Advanced Practice Midwife

## 2016-10-23 ENCOUNTER — Encounter: Payer: Self-pay | Admitting: Advanced Practice Midwife

## 2016-10-23 ENCOUNTER — Ambulatory Visit (INDEPENDENT_AMBULATORY_CARE_PROVIDER_SITE_OTHER): Payer: BLUE CROSS/BLUE SHIELD

## 2016-10-23 VITALS — BP 120/60 | HR 66 | Wt 146.0 lb

## 2016-10-23 DIAGNOSIS — O321XX1 Maternal care for breech presentation, fetus 1: Secondary | ICD-10-CM

## 2016-10-23 DIAGNOSIS — Z3482 Encounter for supervision of other normal pregnancy, second trimester: Secondary | ICD-10-CM

## 2016-10-23 DIAGNOSIS — O09299 Supervision of pregnancy with other poor reproductive or obstetric history, unspecified trimester: Secondary | ICD-10-CM

## 2016-10-23 DIAGNOSIS — Z3A21 21 weeks gestation of pregnancy: Secondary | ICD-10-CM | POA: Diagnosis not present

## 2016-10-23 DIAGNOSIS — Z1389 Encounter for screening for other disorder: Secondary | ICD-10-CM

## 2016-10-23 DIAGNOSIS — Z331 Pregnant state, incidental: Secondary | ICD-10-CM

## 2016-10-23 DIAGNOSIS — Z3402 Encounter for supervision of normal first pregnancy, second trimester: Secondary | ICD-10-CM

## 2016-10-23 DIAGNOSIS — Z363 Encounter for antenatal screening for malformations: Secondary | ICD-10-CM | POA: Diagnosis not present

## 2016-10-23 LAB — POCT URINALYSIS DIPSTICK
Blood, UA: NEGATIVE
GLUCOSE UA: NEGATIVE
Ketones, UA: NEGATIVE
NITRITE UA: NEGATIVE
Protein, UA: NEGATIVE

## 2016-10-23 NOTE — Progress Notes (Addendum)
[place] 20+4 wks,breech,post pl gr 0,normal ov's bilat,svp of fluid 6.1 cm,fhr 149 bpm,efw 376 g,cx 4.1 cm,anatomy complete,no obvious abnormalities seen

## 2016-10-23 NOTE — Progress Notes (Signed)
G2P1001 [redacted]w[redacted]d Estimated Date of Delivery: 03/08/17  Blood pressure 120/60, pulse 66, weight 146 lb (66.2 kg).   BP weight and urine results all reviewed and noted.  Please refer to the obstetrical flow sheet for the fundal height and fetal heart rate documentation:  [place] 20+4 wks,breech,post pl gr 0,normal ov's bilat,svp of fluid 6.1 cm,fhr 149 bpm,efw 376 g,cx 4.1 cm,anatomy complete,no obvious abnormalities seen  Patient reports good fetal movement, denies any bleeding and no rupture of membranes symptoms or regular contractions. Patient is without complaints.  All questions were answered.  Orders Placed This Encounter  Procedures  . POCT urinalysis dipstick    Plan:  Continued routine obstetrical care,   Return in about 4 weeks (around 11/20/2016) for LROB.

## 2016-10-23 NOTE — Patient Instructions (Signed)
Second Trimester of Pregnancy The second trimester is from week 13 through week 28 (months 4 through 6). The second trimester is often a time when you feel your best. Your body has also adjusted to being pregnant, and you begin to feel better physically. Usually, morning sickness has lessened or quit completely, you may have more energy, and you may have an increase in appetite. The second trimester is also a time when the fetus is growing rapidly. At the end of the sixth month, the fetus is about 9 inches long and weighs about 1 pounds. You will likely begin to feel the baby move (quickening) between 18 and 20 weeks of the pregnancy. Body changes during your second trimester Your body continues to go through many changes during your second trimester. The changes vary from woman to woman.  Your weight will continue to increase. You will notice your lower abdomen bulging out.  You may begin to get stretch marks on your hips, abdomen, and breasts.  You may develop headaches that can be relieved by medicines. The medicines should be approved by your health care provider.  You may urinate more often because the fetus is pressing on your bladder.  You may develop or continue to have heartburn as a result of your pregnancy.  You may develop constipation because certain hormones are causing the muscles that push waste through your intestines to slow down.  You may develop hemorrhoids or swollen, bulging veins (varicose veins).  You may have back pain. This is caused by:  Weight gain.  Pregnancy hormones that are relaxing the joints in your pelvis.  A shift in weight and the muscles that support your balance.  Your breasts will continue to grow and they will continue to become tender.  Your gums may bleed and may be sensitive to brushing and flossing.  Dark spots or blotches (chloasma, mask of pregnancy) may develop on your face. This will likely fade after the baby is born.  A dark line  from your belly button to the pubic area (linea nigra) may appear. This will likely fade after the baby is born.  You may have changes in your hair. These can include thickening of your hair, rapid growth, and changes in texture. Some women also have hair loss during or after pregnancy, or hair that feels dry or thin. Your hair will most likely return to normal after your baby is born. What to expect at prenatal visits During a routine prenatal visit:  You will be weighed to make sure you and the fetus are growing normally.  Your blood pressure will be taken.  Your abdomen will be measured to track your baby's growth.  The fetal heartbeat will be listened to.  Any test results from the previous visit will be discussed. Your health care provider may ask you:  How you are feeling.  If you are feeling the baby move.  If you have had any abnormal symptoms, such as leaking fluid, bleeding, severe headaches, or abdominal cramping.  If you are using any tobacco products, including cigarettes, chewing tobacco, and electronic cigarettes.  If you have any questions. Other tests that may be performed during your second trimester include:  Blood tests that check for:  Low iron levels (anemia).  Gestational diabetes (between 24 and 28 weeks).  Rh antibodies. This is to check for a protein on red blood cells (Rh factor).  Urine tests to check for infections, diabetes, or protein in the urine.  An ultrasound to  confirm the proper growth and development of the baby.  An amniocentesis to check for possible genetic problems.  Fetal screens for spina bifida and Down syndrome.  HIV (human immunodeficiency virus) testing. Routine prenatal testing includes screening for HIV, unless you choose not to have this test. Follow these instructions at home: Eating and drinking  Continue to eat regular, healthy meals.  Avoid raw meat, uncooked cheese, cat litter boxes, and soil used by cats. These  carry germs that can cause birth defects in the baby.  Take your prenatal vitamins.  Take 1500-2000 mg of calcium daily starting at the 20th week of pregnancy until you deliver your baby.  If you develop constipation:  Take over-the-counter or prescription medicines.  Drink enough fluid to keep your urine clear or pale yellow.  Eat foods that are high in fiber, such as fresh fruits and vegetables, whole grains, and beans.  Limit foods that are high in fat and processed sugars, such as fried and sweet foods. Activity  Exercise only as directed by your health care provider. Experiencing uterine cramps is a good sign to stop exercising.  Avoid heavy lifting, wear low heel shoes, and practice good posture.  Wear your seat belt at all times when driving.  Rest with your legs elevated if you have leg cramps or low back pain.  Wear a good support bra for breast tenderness.  Do not use hot tubs, steam rooms, or saunas. Lifestyle  Avoid all smoking, herbs, alcohol, and unprescribed drugs. These chemicals affect the formation and growth of the baby.  Do not use any products that contain nicotine or tobacco, such as cigarettes and e-cigarettes. If you need help quitting, ask your health care provider.  A sexual relationship may be continued unless your health care provider directs you otherwise. General instructions  Follow your health care provider's instructions regarding medicine use. There are medicines that are either safe or unsafe to take during pregnancy.  Take warm sitz baths to soothe any pain or discomfort caused by hemorrhoids. Use hemorrhoid cream if your health care provider approves.  If you develop varicose veins, wear support hose. Elevate your feet for 15 minutes, 3-4 times a day. Limit salt in your diet.  Visit your dentist if you have not gone yet during your pregnancy. Use a soft toothbrush to brush your teeth and be gentle when you floss.  Keep all follow-up  prenatal visits as told by your health care provider. This is important. Contact a health care provider if:  You have dizziness.  You have mild pelvic cramps, pelvic pressure, or nagging pain in the abdominal area.  You have persistent nausea, vomiting, or diarrhea.  You have a bad smelling vaginal discharge.  You have pain with urination. Get help right away if:  You have a fever.  You are leaking fluid from your vagina.  You have spotting or bleeding from your vagina.  You have severe abdominal cramping or pain.  You have rapid weight gain or weight loss.  You have shortness of breath with chest pain.  You notice sudden or extreme swelling of your face, hands, ankles, feet, or legs.  You have not felt your baby move in over an hour.  You have severe headaches that do not go away with medicine.  You have vision changes. Summary  The second trimester is from week 13 through week 28 (months 4 through 6). It is also a time when the fetus is growing rapidly.  Your body goes  through many changes during pregnancy. The changes vary from woman to woman.  Avoid all smoking, herbs, alcohol, and unprescribed drugs. These chemicals affect the formation and growth your baby.  Do not use any tobacco products, such as cigarettes, chewing tobacco, and e-cigarettes. If you need help quitting, ask your health care provider.  Contact your health care provider if you have any questions. Keep all prenatal visits as told by your health care provider. This is important. This information is not intended to replace advice given to you by your health care provider. Make sure you discuss any questions you have with your health care provider. Document Released: 11/12/2001 Document Revised: 04/25/2016 Document Reviewed: 01/19/2013 Elsevier Interactive Patient Education  2017 [employer].

## 2016-11-14 ENCOUNTER — Inpatient Hospital Stay (HOSPITAL_COMMUNITY)
Admission: AD | Admit: 2016-11-14 | Discharge: 2016-11-14 | Disposition: A | Discharge status: Home / Self Care | Payer: Medicaid Other | Source: Ambulatory Visit | Attending: Obstetrics and Gynecology | Admitting: Obstetrics and Gynecology

## 2016-11-14 ENCOUNTER — Encounter (HOSPITAL_COMMUNITY): Payer: Self-pay

## 2016-11-14 DIAGNOSIS — Z87442 Personal history of urinary calculi: Secondary | ICD-10-CM | POA: Insufficient documentation

## 2016-11-14 DIAGNOSIS — K5900 Constipation, unspecified: Secondary | ICD-10-CM | POA: Diagnosis not present

## 2016-11-14 DIAGNOSIS — K625 Hemorrhage of anus and rectum: Secondary | ICD-10-CM | POA: Diagnosis not present

## 2016-11-14 DIAGNOSIS — Z880 Allergy status to penicillin: Secondary | ICD-10-CM | POA: Diagnosis not present

## 2016-11-14 DIAGNOSIS — O34211 Maternal care for low transverse scar from previous cesarean delivery: Secondary | ICD-10-CM | POA: Insufficient documentation

## 2016-11-14 DIAGNOSIS — Z3482 Encounter for supervision of other normal pregnancy, second trimester: Secondary | ICD-10-CM

## 2016-11-14 DIAGNOSIS — Z8759 Personal history of other complications of pregnancy, childbirth and the puerperium: Secondary | ICD-10-CM

## 2016-11-14 DIAGNOSIS — Z3A23 23 weeks gestation of pregnancy: Secondary | ICD-10-CM | POA: Insufficient documentation

## 2016-11-14 DIAGNOSIS — O99612 Diseases of the digestive system complicating pregnancy, second trimester: Secondary | ICD-10-CM

## 2016-11-14 DIAGNOSIS — O26892 Other specified pregnancy related conditions, second trimester: Secondary | ICD-10-CM | POA: Diagnosis not present

## 2016-11-14 DIAGNOSIS — O4692 Antepartum hemorrhage, unspecified, second trimester: Secondary | ICD-10-CM | POA: Diagnosis present

## 2016-11-14 LAB — URINALYSIS, ROUTINE W REFLEX MICROSCOPIC
Bilirubin Urine: NEGATIVE
Glucose, UA: NEGATIVE mg/dL
Hgb urine dipstick: NEGATIVE
Ketones, ur: 5 mg/dL — AB
LEUKOCYTES UA: NEGATIVE
Nitrite: NEGATIVE
PROTEIN: NEGATIVE mg/dL
Specific Gravity, Urine: 1.017 (ref 1.005–1.030)
pH: 6 (ref 5.0–8.0)

## 2016-11-14 LAB — WET PREP, GENITAL
CLUE CELLS WET PREP: NONE SEEN
Sperm: NONE SEEN
Trich, Wet Prep: NONE SEEN
YEAST WET PREP: NONE SEEN

## 2016-11-14 MED ORDER — DOCUSATE SODIUM 100 MG PO CAPS: 200.0000 mg | ORAL_CAPSULE | Freq: Two times a day (BID) | ORAL | 0 refills | Status: DC | PRN

## 2016-11-14 NOTE — MAU Note (Signed)
Pt reports she wiped today and had some dark blood on tissue.  No pain or cramping denies recent intercourse.

## 2016-11-14 NOTE — MAU Provider Note (Signed)
Chief Complaint:  Vaginal Bleeding   First Provider Initiated Contact with Patient 11/14/16 1702      HPI: Aimee Burns is a 25 y.o. G2P1001 at 40w5dwho presents to maternity admissions reporting bleeding after having a bowel movement today. She called [facility] but was waiting for a nurse to call back and was worried so came to MAU.  She reports less fetal movement today than usual but is feeling movement now in MAU.  She reports she had bleeding 2 months ago that was similar and was told it was related to sex but does not believe this. She reports last intercourse was 4-5 days ago this time.  She saw blood on the tissue today but no bleeding in her underwear afterwards.  She denies any pain.  She has not tried any treatments and the bleeding has now resolved. She denies LOF, vaginal itching/burning, urinary symptoms, h/a, dizziness, n/v, or fever/chills.    HPI  Past Medical History: Past Medical History:  Diagnosis Date  . Nexplanon insertion 07/08/2013   Placed in left arm 07/08/13 remove 07/08/16  . Pregnancy   . Renal calculi    2011,right parathyroidectomy  . Thyroid tumor, benign     Past obstetric history: OB History  Gravida Para Term Preterm AB Living  2 1 1     1   SAB TAB Ectopic Multiple Live Births          1    # Outcome Date GA Lbr Len/2nd Weight Sex Delivery Anes PTL Lv  2 Current           1 Term 05/17/13 [redacted]w[redacted]d 22:15 / 00:39 5 lb 8.2 oz (2.5 kg) M Vag-Vacuum EPI  LIV      Past Surgical History: Past Surgical History:  Procedure Laterality Date  . Parathyroid gland removed    . PARATHYROIDECTOMY / EXPLORATION OF PARATHYROIDS Right 2011   [place], [place]  . THYROIDECTOMY, PARTIAL Right 2011    Family History: Family History  Problem Relation Age of Onset  . Diabetes Paternal Grandmother   . Cancer Paternal Grandmother     lung  . Thyroid disease Mother     Social History: Social History  Substance Use Topics  . Smoking status: Never  Smoker  . Smokeless tobacco: Never Used  . Alcohol use No    Allergies:  Allergies  Allergen Reactions  . Amoxicillin Rash    Has patient had a PCN reaction causing immediate rash, facial/tongue/throat swelling, SOB or lightheadedness with hypotension: Yes Has patient had a PCN reaction causing severe rash involving mucus membranes or skin necrosis: No Has patient had a PCN reaction that required hospitalization No Has patient had a PCN reaction occurring within the last 10 years: Yes If all of the above answers are "NO", then may proceed with Cephalosporin use.     Meds:  Prescriptions Prior to Admission  Medication Sig Dispense Refill Last Dose  . Prenatal Vit-Fe Fumarate-FA (MULTIVITAMIN-PRENATAL) 27-0.8 MG TABS tablet Take 1 tablet by mouth daily at 12 noon.   11/13/2016 at Unknown time    ROS:  Review of Systems  Constitutional: Negative for chills, fatigue and fever.  Eyes: Negative for visual disturbance.  Respiratory: Negative for shortness of breath.   Cardiovascular: Negative for chest pain.  Gastrointestinal: Negative for abdominal pain, nausea and vomiting.  Genitourinary: Positive for vaginal bleeding. Negative for difficulty urinating, dysuria, flank pain, pelvic pain, vaginal discharge and vaginal pain.  Neurological: Negative for dizziness and headaches.  Psychiatric/Behavioral: Negative.      I have reviewed patient's Past Medical Hx, Surgical Hx, Family Hx, Social Hx, medications and allergies.   Physical Exam  Patient Vitals for the past 24 hrs:  BP Temp Pulse Resp Height Weight  11/14/16 1513 (!) 122/54 98.3 F (36.8 C) 85 18 5\' 6"  (1.676 m) 150 lb 1.9 oz (68.1 kg)   Constitutional: Well-developed, well-nourished female in no acute distress.  Cardiovascular: normal rate Respiratory: normal effort GI: Abd soft, non-tender, gravid appropriate for gestational age.  MS: Extremities nontender, no edema, normal ROM Neurologic: Alert and oriented x 4.   GU: Neg CVAT.  PELVIC EXAM: Cervix pink, visually closed, without lesion, scant white creamy discharge, no bleeding noted, vaginal walls and external genitalia normal  On visual inspection, small amount dark red blood noted in rectal area.  Rectal exam done and dark brown/dark red bleeding noted on glove. No palpable hemorrhoid, no tenderness on exam.  Dilation: Closed Effacement (%): Thick Exam by:: Danelle Berry CNM   FHT:  Baseline  , moderate variability, accelerations present, no decelerations Contractions: None on toco or to palpation   Labs: Results for orders placed or performed during the hospital encounter of 11/14/16 (from the past 24 hour(s))  Urinalysis, Routine w reflex microscopic     Status: Abnormal   Collection Time: 11/14/16  4:04 PM  Result Value Ref Range   Color, Urine YELLOW YELLOW   APPearance CLEAR CLEAR   Specific Gravity, Urine 1.017 1.005 - 1.030   pH 6.0 5.0 - 8.0   Glucose, UA NEGATIVE NEGATIVE mg/dL   Hgb urine dipstick NEGATIVE NEGATIVE   Bilirubin Urine NEGATIVE NEGATIVE   Ketones, ur 5 (A) NEGATIVE mg/dL   Protein, ur NEGATIVE NEGATIVE mg/dL   Nitrite NEGATIVE NEGATIVE   Leukocytes, UA NEGATIVE NEGATIVE  Wet prep, genital     Status: Abnormal   Collection Time: 11/14/16  4:56 PM  Result Value Ref Range   Yeast Wet Prep HPF POC NONE SEEN NONE SEEN   Trich, Wet Prep NONE SEEN NONE SEEN   Clue Cells Wet Prep HPF POC NONE SEEN NONE SEEN   WBC, Wet Prep HPF POC MANY (A) NONE SEEN   Sperm NONE SEEN       Imaging:  No results found.  MAU Course/MDM: I have ordered labs and reviewed results.  NST reviewed Likely constipation and internal hemorrhoid causing rectal bleeding. Will treat constipation with high fiber diet, Colace PRN.  Pt to f/u with [facility] if bleeding continues.  Pt stable at time of discharge.   Assessment: 1. Rectal bleeding   2. Encounter for supervision of other normal pregnancy in second trimester   3. History of  prior pregnancy with IUGR newborn   4. Constipation during pregnancy in second trimester     Plan: Discharge home Labor precautions and fetal kick counts Follow-up Information    [facility] Follow up.   Specialty:  Obstetrics and Gynecology Why:  As scheduled, return to MAU as needed for emergencies Contact information: [place] [address] [phone]         Allergies as of 11/14/2016      Reactions   Amoxicillin Rash   Has patient had a PCN reaction causing immediate rash, facial/tongue/throat swelling, SOB or lightheadedness with hypotension: Yes Has patient had a PCN reaction causing severe rash involving mucus membranes or skin necrosis: No Has patient had a PCN reaction that required hospitalization No Has  patient had a PCN reaction occurring within the last 10 years: Yes If all of the above answers are "NO", then may proceed with Cephalosporin use.      Medication List    TAKE these medications   docusate sodium 100 MG capsule Commonly known as:  COLACE Take 2 capsules (200 mg total) by mouth 2 (two) times daily as needed.   multivitamin-prenatal 27-0.8 MG Tabs tablet Take 1 tablet by mouth daily at 12 noon.       Fatima Blank Certified Nurse-Midwife 11/14/2016 5:14 PM

## 2016-11-14 NOTE — Discharge Instructions (Signed)
Hemorrhoids Hemorrhoids are swollen veins in and around the rectum or anus. There are two types of hemorrhoids:  Internal hemorrhoids. These occur in the veins that are just inside the rectum. They may poke through to the outside and become irritated and painful.  External hemorrhoids. These occur in the veins that are outside of the anus and can be felt as a painful swelling or hard lump near the anus.  Most hemorrhoids do not cause serious problems, and they can be managed with home treatments such as diet and lifestyle changes. If home treatments do not help your symptoms, procedures can be done to shrink or remove the hemorrhoids. What are the causes? This condition is caused by increased pressure in the anal area. This pressure may result from various things, including:  Constipation.  Straining to have a bowel movement.  Diarrhea.  Pregnancy.  Obesity.  Sitting for long periods of time.  Heavy lifting or other activity that causes you to strain.  Anal sex.  What are the signs or symptoms? Symptoms of this condition include:  Pain.  Anal itching or irritation.  Rectal bleeding.  Leakage of stool (feces).  Anal swelling.  One or more lumps around the anus.  How is this diagnosed? This condition can often be diagnosed through a visual exam. Other exams or tests may also be done, such as:  Examination of the rectal area with a gloved hand (digital rectal exam).  Examination of the anal canal using a small tube (anoscope).  A blood test, if you have lost a significant amount of blood.  A test to look inside the colon (sigmoidoscopy or colonoscopy).  How is this treated? This condition can usually be treated at home. However, various procedures may be done if dietary changes, lifestyle changes, and other home treatments do not help your symptoms. These procedures can help make the hemorrhoids smaller or remove them completely. Some of these procedures involve  surgery, and others do not. Common procedures include:  Rubber band ligation. Rubber bands are placed at the base of the hemorrhoids to cut off the blood supply to them.  Sclerotherapy. Medicine is injected into the hemorrhoids to shrink them.  Infrared coagulation. A type of light energy is used to get rid of the hemorrhoids.  Hemorrhoidectomy surgery. The hemorrhoids are surgically removed, and the veins that supply them are tied off.  Stapled hemorrhoidopexy surgery. A circular stapling device is used to remove the hemorrhoids and use staples to cut off the blood supply to them.  Follow these instructions at home: Eating and drinking  Eat foods that have a lot of fiber in them, such as whole grains, beans, nuts, fruits, and vegetables. Ask your health care provider about taking products that have added fiber (fiber supplements).  Drink enough fluid to keep your urine clear or pale yellow. Managing pain and swelling  Take warm sitz baths for 20 minutes, 3-4 times a day to ease pain and discomfort.  If directed, apply ice to the affected area. Using ice packs between sitz baths may be helpful. ? Put ice in a plastic bag. ? Place a towel between your skin and the bag. ? Leave the ice on for 20 minutes, 2-3 times a day. General instructions  Take over-the-counter and prescription medicines only as told by your health care provider.  Use medicated creams or suppositories as told.  Exercise regularly.  Go to the bathroom when you have the urge to have a bowel movement. Do not wait.    Avoid straining to have bowel movements.  Keep the anal area dry and clean. Use wet toilet paper or moist towelettes after a bowel movement.  Do not sit on the toilet for long periods of time. This increases blood pooling and pain. Contact a health care provider if:  You have increasing pain and swelling that are not controlled by treatment or medicine.  You have uncontrolled bleeding.  You  have difficulty having a bowel movement, or you are unable to have a bowel movement.  You have pain or inflammation outside the area of the hemorrhoids. This information is not intended to replace advice given to you by your health care provider. Make sure you discuss any questions you have with your health care provider. Document Released: 11/15/2000 Document Revised: 04/17/2016 Document Reviewed: 08/02/2015 Elsevier Interactive Patient Education  2017 [employer]   Constipation, Adult Constipation is when a person has fewer bowel movements in a week than normal, has difficulty having a bowel movement, or has stools that are dry, hard, or larger than normal. Constipation may be caused by an underlying condition. It may become worse with age if a person takes certain medicines and does not take in enough fluids. Follow these instructions at home: Eating and drinking   Eat foods that have a lot of fiber, such as fresh fruits and vegetables, whole grains, and beans.  Limit foods that are high in fat, low in fiber, or overly processed, such as french fries, hamburgers, cookies, candies, and soda.  Drink enough fluid to keep your urine clear or pale yellow. General instructions  Exercise regularly or as told by your health care provider.  Go to the restroom when you have the urge to go. Do not hold it in.  Take over-the-counter and prescription medicines only as told by your health care provider. These include any fiber supplements.  Practice pelvic floor retraining exercises, such as deep breathing while relaxing the lower abdomen and pelvic floor relaxation during bowel movements.  Watch your condition for any changes.  Keep all follow-up visits as told by your health care provider. This is important. Contact a health care provider if:  You have pain that gets worse.  You have a fever.  You do not have a bowel movement after 4 days.  You vomit.  You are not hungry.  You  lose weight.  You are bleeding from the anus.  You have thin, pencil-like stools. Get help right away if:  You have a fever and your symptoms suddenly get worse.  You leak stool or have blood in your stool.  Your abdomen is bloated.  You have severe pain in your abdomen.  You feel dizzy or you faint. This information is not intended to replace advice given to you by your health care provider. Make sure you discuss any questions you have with your health care provider. Document Released: 08/16/2004 Document Revised: 06/07/2016 Document Reviewed: 05/08/2016 Elsevier Interactive Patient Education  2017 Elsevier Inc.  

## 2016-11-15 LAB — GC/CHLAMYDIA PROBE AMP (~~LOC~~) NOT AT ARMC
Chlamydia: NEGATIVE
Neisseria Gonorrhea: NEGATIVE

## 2016-11-18 ENCOUNTER — Telehealth: Payer: Self-pay | Admitting: *Deleted

## 2016-11-18 NOTE — Telephone Encounter (Signed)
Message sent via [facility] pt of results.

## 2016-11-18 NOTE — Telephone Encounter (Signed)
Called pt and left message stating that I am calling with non-emergent test results. Please call back and leave a message stating whether we may leave these details on your voice mail. Pt needs to be informed that her GC/CT test was negative.

## 2016-11-20 ENCOUNTER — Encounter: Payer: Self-pay | Admitting: Adult Health

## 2016-11-20 ENCOUNTER — Ambulatory Visit (INDEPENDENT_AMBULATORY_CARE_PROVIDER_SITE_OTHER): Payer: Medicaid Other | Admitting: Adult Health

## 2016-11-20 VITALS — BP 100/60 | HR 82 | Wt 153.0 lb

## 2016-11-20 DIAGNOSIS — Z331 Pregnant state, incidental: Secondary | ICD-10-CM

## 2016-11-20 DIAGNOSIS — Z3A25 25 weeks gestation of pregnancy: Secondary | ICD-10-CM

## 2016-11-20 DIAGNOSIS — Z3482 Encounter for supervision of other normal pregnancy, second trimester: Secondary | ICD-10-CM

## 2016-11-20 DIAGNOSIS — O09292 Supervision of pregnancy with other poor reproductive or obstetric history, second trimester: Secondary | ICD-10-CM

## 2016-11-20 DIAGNOSIS — Z1389 Encounter for screening for other disorder: Secondary | ICD-10-CM

## 2016-11-20 DIAGNOSIS — O09291 Supervision of pregnancy with other poor reproductive or obstetric history, first trimester: Secondary | ICD-10-CM

## 2016-11-20 LAB — POCT URINALYSIS DIPSTICK
Blood, UA: NEGATIVE
Glucose, UA: NEGATIVE
Ketones, UA: NEGATIVE
NITRITE UA: NEGATIVE
PROTEIN UA: NEGATIVE

## 2016-11-20 NOTE — Progress Notes (Signed)
G2P1001 [redacted]w[redacted]d Estimated Date of Delivery: 03/08/17  Blood pressure 100/60, pulse 82, weight 153 lb (69.4 kg).   BP weight and urine results all reviewed and noted.  Please refer to the obstetrical flow sheet for the fundal height and fetal heart rate documentation:  Patient reports good fetal movement, denies any bleeding and no rupture of membranes symptoms or regular contractions. Patient is without complaints. All questions were answered.  Orders Placed This Encounter  Procedures  . POCT Urinalysis Dipstick    Plan:  Continued routine obstetrical care,  Return in 4 weeks for PN 2 and [place] and see Manus Gunning

## 2016-12-02 NOTE — L&D Delivery Note (Signed)
Patient is 26 y.o. G2P1001 [redacted]w[redacted]d admitted for SOL. Mother is Rh negative.    Delivery Note At 2:17 AM a viable female was delivered via Vaginal, Spontaneous Delivery (Presentation: POP with loose nuchal x1 ).  APGAR: 8, 10; weight pending.   Placenta status: complete with 3 vessel record.   Anesthesia: none Episiotomy: None Lacerations:  1st degre perineal; hemostatic Suture Repair: n/a Est. Blood Loss (mL):  100  Mom to postpartum.  Baby to Couplet care / Skin to Skin.  Mercy Riding 03/06/2017, 2:32 AM  I was present and gloved for the delivery in its entirety and I agree with the above findings and documentation.  Maye Hides CNM

## 2016-12-04 ENCOUNTER — Telehealth: Payer: Self-pay | Admitting: Obstetrics & Gynecology

## 2016-12-04 NOTE — Telephone Encounter (Signed)
Pt called stating that she has been having a bad rash down her leg for 4 days now na d today her throat started tickling. Please contact pt

## 2016-12-04 NOTE — Telephone Encounter (Signed)
Patient called with complaints of bilateral lower extremity rash that has been present for 4 days since she started wearing TED hose. She did not wash the hose before her first use. She has tried Cortizone cream but is not getting any relief from the itching. When the lotion is applied, she has burning. I explained to the patient that the burning is probably from scratching and opening up the skin. I spoke with Manus Gunning and she suggested Benadryl cream or Calamine lotion. She could also try Benadryl tablets but patient stated they make her sleepy and she has a 24 year old. I encouraged the patient to give [place] a call back if these did not help. Pt verbalized understanding.

## 2016-12-18 ENCOUNTER — Encounter: Payer: Medicaid Other | Admitting: Advanced Practice Midwife

## 2016-12-18 ENCOUNTER — Other Ambulatory Visit: Payer: Medicaid Other

## 2016-12-25 ENCOUNTER — Ambulatory Visit (INDEPENDENT_AMBULATORY_CARE_PROVIDER_SITE_OTHER): Payer: Medicaid Other | Admitting: Women's Health

## 2016-12-25 ENCOUNTER — Encounter: Payer: Self-pay | Admitting: Women's Health

## 2016-12-25 ENCOUNTER — Ambulatory Visit (INDEPENDENT_AMBULATORY_CARE_PROVIDER_SITE_OTHER): Payer: BLUE CROSS/BLUE SHIELD

## 2016-12-25 ENCOUNTER — Other Ambulatory Visit (INDEPENDENT_AMBULATORY_CARE_PROVIDER_SITE_OTHER): Payer: Medicaid Other | Admitting: *Deleted

## 2016-12-25 VITALS — BP 114/68 | HR 81 | Wt 155.0 lb

## 2016-12-25 DIAGNOSIS — Z8759 Personal history of other complications of pregnancy, childbirth and the puerperium: Secondary | ICD-10-CM

## 2016-12-25 DIAGNOSIS — Z1389 Encounter for screening for other disorder: Secondary | ICD-10-CM

## 2016-12-25 DIAGNOSIS — O09291 Supervision of pregnancy with other poor reproductive or obstetric history, first trimester: Secondary | ICD-10-CM

## 2016-12-25 DIAGNOSIS — Z331 Pregnant state, incidental: Secondary | ICD-10-CM

## 2016-12-25 DIAGNOSIS — Z3483 Encounter for supervision of other normal pregnancy, third trimester: Secondary | ICD-10-CM

## 2016-12-25 DIAGNOSIS — O26899 Other specified pregnancy related conditions, unspecified trimester: Secondary | ICD-10-CM

## 2016-12-25 DIAGNOSIS — Z3402 Encounter for supervision of normal first pregnancy, second trimester: Secondary | ICD-10-CM

## 2016-12-25 DIAGNOSIS — Z3A29 29 weeks gestation of pregnancy: Secondary | ICD-10-CM

## 2016-12-25 DIAGNOSIS — O09293 Supervision of pregnancy with other poor reproductive or obstetric history, third trimester: Secondary | ICD-10-CM

## 2016-12-25 DIAGNOSIS — Z3A30 30 weeks gestation of pregnancy: Secondary | ICD-10-CM

## 2016-12-25 DIAGNOSIS — Z6791 Unspecified blood type, Rh negative: Secondary | ICD-10-CM | POA: Insufficient documentation

## 2016-12-25 LAB — POCT URINALYSIS DIPSTICK
Glucose, UA: NEGATIVE
KETONES UA: NEGATIVE
Nitrite, UA: NEGATIVE
Protein, UA: NEGATIVE
RBC UA: NEGATIVE

## 2016-12-25 NOTE — Progress Notes (Signed)
[place] [id number] wks,cephalic,cx 3.3 cm,normal ov's bilat,post pl gr 0,afi 11.5 cm,fhr 139 bpm,efw 1437 g 53%

## 2016-12-25 NOTE — Progress Notes (Signed)
Low-risk OB appointment G2P1001 [redacted]w[redacted]d Estimated Date of Delivery: 03/08/17 BP 114/68   Pulse 81   Wt 155 lb (70.3 kg)   LMP  (LMP Unknown)   BMI 25.02 kg/m   BP, weight, and urine reviewed.  Refer to obstetrical flow sheet for FH & FHR.  Reports good fm.  Denies regular uc's, lof, vb, or uti s/s. Leg cramps and spider veins Lt leg, has tried compression stockings from [employer] but they gave her a rash. No s/s DVT. Recommended going to [facility] to get stockings. Discussed and gave printed info on leg cramps.  Reviewed today's efw u/s for h/o IUGR: efw 53%/normal afi. Discussed ptl s/s, fkc. Recommended Tdap at HD/PCP per CDC guidelines.  Plan:  Continue routine obstetrical care  F/U in 3wks for OB appointment and Rhogam PN2 today

## 2016-12-25 NOTE — Patient Instructions (Signed)
Tips to Help Leg Cramps  Increase dietary sources of calcium (milk, yogurt, cheese, leafy greens, seafood, legumes, and fruit) and magnesium (dark leafy greens, nuts, seeds, fish, beans, whole grains, avocados, yogurt, bananas, dried fruit, dark chocolate)  Spoonful of regular yellow mustard every night  Pickle juice  Magnesium supplement: 55mmol in the morning, 38mmol at night (can find in the vitamin aisle)  Dorsiflexion of foot: pointing your toes back towards your knee during the cramp      Call the office [phone]) or go to [facility] if:  You begin to have strong, frequent contractions  Your water breaks.  Sometimes it is a big gush of fluid, sometimes it is just a trickle that keeps getting your panties wet or running down your legs  You have vaginal bleeding.  It is normal to have a small amount of spotting if your cervix was checked.   You don't feel your baby moving like normal.  If you don't, get you something to eat and drink and lay down and focus on feeling your baby move.  You should feel at least 10 movements in 2 hours.  If you don't, you should call the office or go to [facility].    Tdap Vaccine  It is recommended that you get the Tdap vaccine during the third trimester of EACH pregnancy to help protect your baby from getting pertussis (whooping cough)  27-36 weeks is the BEST time to do this so that you can pass the protection on to your baby. During pregnancy is better than after pregnancy, but if you are unable to get it during pregnancy it will be offered at the hospital.   You can get this vaccine at the health department or your family doctor  Everyone who will be around your baby should also be up-to-date on their vaccines. Adults (who are not pregnant) only need 1 dose of Tdap during adulthood.   Third Trimester of Pregnancy The third trimester is from week 29 through week 42, months 7 through 9. The third trimester is a time when the fetus is  growing rapidly. At the end of the ninth month, the fetus is about 20 inches in length and weighs 6-10 pounds.  BODY CHANGES Your body goes through many changes during pregnancy. The changes vary from woman to woman.   Your weight will continue to increase. You can expect to gain 25-35 pounds (11-16 kg) by the end of the pregnancy.  You may begin to get stretch marks on your hips, abdomen, and breasts.  You may urinate more often because the fetus is moving lower into your pelvis and pressing on your bladder.  You may develop or continue to have heartburn as a result of your pregnancy.  You may develop constipation because certain hormones are causing the muscles that push waste through your intestines to slow down.  You may develop hemorrhoids or swollen, bulging veins (varicose veins).  You may have pelvic pain because of the weight gain and pregnancy hormones relaxing your joints between the bones in your pelvis. Backaches may result from overexertion of the muscles supporting your posture.  You may have changes in your hair. These can include thickening of your hair, rapid growth, and changes in texture. Some women also have hair loss during or after pregnancy, or hair that feels dry or thin. Your hair will most likely return to normal after your baby is born.  Your breasts will continue to grow and be tender. A yellow  discharge may leak from your breasts called colostrum.  Your belly button may stick out.  You may feel short of breath because of your expanding uterus.  You may notice the fetus "dropping," or moving lower in your abdomen.  You may have a bloody mucus discharge. This usually occurs a few days to a week before labor begins.  Your cervix becomes thin and soft (effaced) near your due date. WHAT TO EXPECT AT YOUR PRENATAL EXAMS  You will have prenatal exams every 2 weeks until week 36. Then, you will have weekly prenatal exams. During a routine prenatal visit:  You  will be weighed to make sure you and the fetus are growing normally.  Your blood pressure is taken.  Your abdomen will be measured to track your baby's growth.  The fetal heartbeat will be listened to.  Any test results from the previous visit will be discussed.  You may have a cervical check near your due date to see if you have effaced. At around 36 weeks, your caregiver will check your cervix. At the same time, your caregiver will also perform a test on the secretions of the vaginal tissue. This test is to determine if a type of bacteria, Group B streptococcus, is present. Your caregiver will explain this further. Your caregiver may ask you:  What your birth plan is.  How you are feeling.  If you are feeling the baby move.  If you have had any abnormal symptoms, such as leaking fluid, bleeding, severe headaches, or abdominal cramping.  If you have any questions. Other tests or screenings that may be performed during your third trimester include:  Blood tests that check for low iron levels (anemia).  Fetal testing to check the health, activity level, and growth of the fetus. Testing is done if you have certain medical conditions or if there are problems during the pregnancy. FALSE LABOR You may feel small, irregular contractions that eventually go away. These are called Braxton Hicks contractions, or false labor. Contractions may last for hours, days, or even weeks before true labor sets in. If contractions come at regular intervals, intensify, or become painful, it is best to be seen by your caregiver.  SIGNS OF LABOR   Menstrual-like cramps.  Contractions that are 5 minutes apart or less.  Contractions that start on the top of the uterus and spread down to the lower abdomen and back.  A sense of increased pelvic pressure or back pain.  A watery or bloody mucus discharge that comes from the vagina. If you have any of these signs before the 37th week of pregnancy, call your  caregiver right away. You need to go to the hospital to get checked immediately. HOME CARE INSTRUCTIONS   Avoid all smoking, herbs, alcohol, and unprescribed drugs. These chemicals affect the formation and growth of the baby.  Follow your caregiver's instructions regarding medicine use. There are medicines that are either safe or unsafe to take during pregnancy.  Exercise only as directed by your caregiver. Experiencing uterine cramps is a good sign to stop exercising.  Continue to eat regular, healthy meals.  Wear a good support bra for breast tenderness.  Do not use hot tubs, steam rooms, or saunas.  Wear your seat belt at all times when driving.  Avoid raw meat, uncooked cheese, cat litter boxes, and soil used by cats. These carry germs that can cause birth defects in the baby.  Take your prenatal vitamins.  Try taking a stool softener (if  your caregiver approves) if you develop constipation. Eat more high-fiber foods, such as fresh vegetables or fruit and whole grains. Drink plenty of fluids to keep your urine clear or pale yellow.  Take warm sitz baths to soothe any pain or discomfort caused by hemorrhoids. Use hemorrhoid cream if your caregiver approves.  If you develop varicose veins, wear support hose. Elevate your feet for 15 minutes, 3-4 times a day. Limit salt in your diet.  Avoid heavy lifting, wear low heal shoes, and practice good posture.  Rest a lot with your legs elevated if you have leg cramps or low back pain.  Visit your dentist if you have not gone during your pregnancy. Use a soft toothbrush to brush your teeth and be gentle when you floss.  A sexual relationship may be continued unless your caregiver directs you otherwise.  Do not travel far distances unless it is absolutely necessary and only with the approval of your caregiver.  Take prenatal classes to understand, practice, and ask questions about the labor and delivery.  Make a trial run to the  hospital.  Pack your hospital bag.  Prepare the baby's nursery.  Continue to go to all your prenatal visits as directed by your caregiver. SEEK MEDICAL CARE IF:  You are unsure if you are in labor or if your water has broken.  You have dizziness.  You have mild pelvic cramps, pelvic pressure, or nagging pain in your abdominal area.  You have persistent nausea, vomiting, or diarrhea.  You have a bad smelling vaginal discharge.  You have pain with urination. SEEK IMMEDIATE MEDICAL CARE IF:   You have a fever.  You are leaking fluid from your vagina.  You have spotting or bleeding from your vagina.  You have severe abdominal cramping or pain.  You have rapid weight loss or gain.  You have shortness of breath with chest pain.  You notice sudden or extreme swelling of your face, hands, ankles, feet, or legs.  You have not felt your baby move in over an hour.  You have severe headaches that do not go away with medicine.  You have vision changes. Document Released: 11/12/2001 Document Revised: 11/23/2013 Document Reviewed: 01/19/2013 [facility] Patient Information 2015 [place], [place]. This information is not intended to replace advice given to you by your health care provider. Make sure you discuss any questions you have with your health care provider.

## 2016-12-26 LAB — GLUCOSE TOLERANCE, 2 HOURS W/ 1HR
GLUCOSE, 1 HOUR: 109 mg/dL (ref 65–179)
GLUCOSE, 2 HOUR: 86 mg/dL (ref 65–152)
GLUCOSE, FASTING: 70 mg/dL (ref 65–91)

## 2016-12-26 LAB — CBC
HEMATOCRIT: 31.8 % — AB (ref 34.0–46.6)
Hemoglobin: 10.8 g/dL — ABNORMAL LOW (ref 11.1–15.9)
MCH: 31.1 pg (ref 26.6–33.0)
MCHC: 34 g/dL (ref 31.5–35.7)
MCV: 92 fL (ref 79–97)
Platelets: 275 10*3/uL (ref 150–379)
RBC: 3.47 x10E6/uL — ABNORMAL LOW (ref 3.77–5.28)
RDW: 13.6 % (ref 12.3–15.4)
WBC: 8.7 10*3/uL (ref 3.4–10.8)

## 2016-12-26 LAB — HIV ANTIBODY (ROUTINE TESTING W REFLEX): HIV Screen 4th Generation wRfx: NONREACTIVE

## 2016-12-26 LAB — RPR: RPR Ser Ql: NONREACTIVE

## 2016-12-26 LAB — ANTIBODY SCREEN: ANTIBODY SCREEN: NEGATIVE

## 2017-01-07 ENCOUNTER — Telehealth: Payer: Self-pay | Admitting: Women's Health

## 2017-01-07 NOTE — Telephone Encounter (Signed)
Patient called stating her blood pressure was low at the pharmacy 92/77 and wanted to know if that was ok. I informed patient that her baseline in previous appointments have been 100-114/60-70 so she is around her baseline. Informed her as long as she is not symptomatic with the blood pressure, then to not be too concerned at this time. Pt verbalized understanding.

## 2017-01-15 ENCOUNTER — Ambulatory Visit (INDEPENDENT_AMBULATORY_CARE_PROVIDER_SITE_OTHER): Payer: BLUE CROSS/BLUE SHIELD | Admitting: Advanced Practice Midwife

## 2017-01-15 ENCOUNTER — Encounter: Payer: Self-pay | Admitting: Advanced Practice Midwife

## 2017-01-15 VITALS — BP 105/58 | HR 80 | Wt 160.0 lb

## 2017-01-15 DIAGNOSIS — O26899 Other specified pregnancy related conditions, unspecified trimester: Secondary | ICD-10-CM

## 2017-01-15 DIAGNOSIS — Z1389 Encounter for screening for other disorder: Secondary | ICD-10-CM

## 2017-01-15 DIAGNOSIS — O360131 Maternal care for anti-D [Rh] antibodies, third trimester, fetus 1: Secondary | ICD-10-CM | POA: Diagnosis not present

## 2017-01-15 DIAGNOSIS — Z3A32 32 weeks gestation of pregnancy: Secondary | ICD-10-CM

## 2017-01-15 DIAGNOSIS — Z6791 Unspecified blood type, Rh negative: Secondary | ICD-10-CM

## 2017-01-15 DIAGNOSIS — Z3483 Encounter for supervision of other normal pregnancy, third trimester: Secondary | ICD-10-CM

## 2017-01-15 DIAGNOSIS — Z331 Pregnant state, incidental: Secondary | ICD-10-CM

## 2017-01-15 DIAGNOSIS — O09293 Supervision of pregnancy with other poor reproductive or obstetric history, third trimester: Secondary | ICD-10-CM

## 2017-01-15 LAB — POCT URINALYSIS DIPSTICK
Blood, UA: NEGATIVE
Glucose, UA: NEGATIVE
KETONES UA: NEGATIVE
LEUKOCYTES UA: NEGATIVE
Nitrite, UA: NEGATIVE
PROTEIN UA: NEGATIVE

## 2017-01-15 MED ORDER — RHO D IMMUNE GLOBULIN 1500 UNIT/2ML IJ SOSY
300.0000 ug | PREFILLED_SYRINGE | Freq: Once | INTRAMUSCULAR | Status: AC
  Administered 2017-01-15: 300 ug via INTRAMUSCULAR

## 2017-01-15 NOTE — Progress Notes (Signed)
G2P1001 [redacted]w[redacted]d Estimated Date of Delivery: 03/08/17  Blood pressure (!) 105/58, pulse 80, weight 160 lb (72.6 kg).   BP weight and urine results all reviewed and noted.  Please refer to the obstetrical flow sheet for the fundal height and fetal heart rate documentation:  Patient reports good fetal movement, denies any bleeding and no rupture of membranes symptoms or regular contractions. Patient is without complaints. All questions were answered.  Orders Placed This Encounter  Procedures  . POCT urinalysis dipstick    Plan:  Continued routine obstetrical care, Rhogam today  Return in about 2 weeks (around 01/29/2017) for [place].

## 2017-01-15 NOTE — Patient Instructions (Signed)
Third Trimester of Pregnancy The third trimester is from week 29 through week 40 (months 7 through 9). The third trimester is a time when the unborn baby (fetus) is growing rapidly. At the end of the ninth month, the fetus is about 20 inches in length and weighs 6-10 pounds. Body changes during your third trimester Your body goes through many changes during pregnancy. The changes vary from woman to woman. During the third trimester:  Your weight will continue to increase. You can expect to gain 25-35 pounds (11-16 kg) by the end of the pregnancy.  You may begin to get stretch marks on your hips, abdomen, and breasts.  You may urinate more often because the fetus is moving lower into your pelvis and pressing on your bladder.  You may develop or continue to have heartburn. This is caused by increased hormones that slow down muscles in the digestive tract.  You may develop or continue to have constipation because increased hormones slow digestion and cause the muscles that push waste through your intestines to relax.  You may develop hemorrhoids. These are swollen veins (varicose veins) in the rectum that can itch or be painful.  You may develop swollen, bulging veins (varicose veins) in your legs.  You may have increased body aches in the pelvis, back, or thighs. This is due to weight gain and increased hormones that are relaxing your joints.  You may have changes in your hair. These can include thickening of your hair, rapid growth, and changes in texture. Some women also have hair loss during or after pregnancy, or hair that feels dry or thin. Your hair will most likely return to normal after your baby is born.  Your breasts will continue to grow and they will continue to become tender. A yellow fluid (colostrum) may leak from your breasts. This is the first milk you are producing for your baby.  Your belly button may stick out.  You may notice more swelling in your hands, face, or  ankles.  You may have increased tingling or numbness in your hands, arms, and legs. The skin on your belly may also feel numb.  You may feel short of breath because of your expanding uterus.  You may have more problems sleeping. This can be caused by the size of your belly, increased need to urinate, and an increase in your body's metabolism.  You may notice the fetus "dropping," or moving lower in your abdomen.  You may have increased vaginal discharge.  Your cervix becomes thin and soft (effaced) near your due date. What to expect at prenatal visits You will have prenatal exams every 2 weeks until week 36. Then you will have weekly prenatal exams. During a routine prenatal visit:  You will be weighed to make sure you and the fetus are growing normally.  Your blood pressure will be taken.  Your abdomen will be measured to track your baby's growth.  The fetal heartbeat will be listened to.  Any test results from the previous visit will be discussed.  You may have a cervical check near your due date to see if you have effaced. At around 36 weeks, your health care provider will check your cervix. At the same time, your health care provider will also perform a test on the secretions of the vaginal tissue. This test is to determine if a type of bacteria, Group B streptococcus, is present. Your health care provider will explain this further. Your health care provider may ask you:    What your birth plan is.  How you are feeling.  If you are feeling the baby move.  If you have had any abnormal symptoms, such as leaking fluid, bleeding, severe headaches, or abdominal cramping.  If you are using any tobacco products, including cigarettes, chewing tobacco, and electronic cigarettes.  If you have any questions. Other tests or screenings that may be performed during your third trimester include:  Blood tests that check for low iron levels (anemia).  Fetal testing to check the health,  activity level, and growth of the fetus. Testing is done if you have certain medical conditions or if there are problems during the pregnancy.  Nonstress test (NST). This test checks the health of your baby to make sure there are no signs of problems, such as the baby not getting enough oxygen. During this test, a belt is placed around your belly. The baby is made to move, and its heart rate is monitored during movement. What is false labor? False labor is a condition in which you feel small, irregular tightenings of the muscles in the womb (contractions) that eventually go away. These are called Braxton Hicks contractions. Contractions may last for hours, days, or even weeks before true labor sets in. If contractions come at regular intervals, become more frequent, increase in intensity, or become painful, you should see your health care provider. What are the signs of labor?  Abdominal cramps.  Regular contractions that start at 10 minutes apart and become stronger and more frequent with time.  Contractions that start on the top of the uterus and spread down to the lower abdomen and back.  Increased pelvic pressure and dull back pain.  A watery or bloody mucus discharge that comes from the vagina.  Leaking of amniotic fluid. This is also known as your "water breaking." It could be a slow trickle or a gush. Let your doctor know if it has a color or strange odor. If you have any of these signs, call your health care provider right away, even if it is before your due date. Follow these instructions at home: Eating and drinking  Continue to eat regular, healthy meals.  Do not eat:  Raw meat or meat spreads.  Unpasteurized milk or cheese.  Unpasteurized juice.  Store-made salad.  Refrigerated smoked seafood.  Hot dogs or deli meat, unless they are piping hot.  More than 6 ounces of albacore tuna a week.  Shark, swordfish, king mackerel, or tile fish.  Store-made salads.  Raw  sprouts, such as mung bean or alfalfa sprouts.  Take prenatal vitamins as told by your health care provider.  Take 1000 mg of calcium daily as told by your health care provider.  If you develop constipation:  Take over-the-counter or prescription medicines.  Drink enough fluid to keep your urine clear or pale yellow.  Eat foods that are high in fiber, such as fresh fruits and vegetables, whole grains, and beans.  Limit foods that are high in fat and processed sugars, such as fried and sweet foods. Activity  Exercise only as directed by your health care provider. Healthy pregnant women should aim for 2 hours and 30 minutes of moderate exercise per week. If you experience any pain or discomfort while exercising, stop.  Avoid heavy lifting.  Do not exercise in extreme heat or humidity, or at high altitudes.  Wear low-heel, comfortable shoes.  Practice good posture.  Do not travel far distances unless it is absolutely necessary and only with the approval   of your health care provider.  Wear your seat belt at all times while in a car, on a bus, or on a plane.  Take frequent breaks and rest with your legs elevated if you have leg cramps or low back pain.  Do not use hot tubs, steam rooms, or saunas.  You may continue to have sex unless your health care provider tells you otherwise. Lifestyle  Do not use any products that contain nicotine or tobacco, such as cigarettes and e-cigarettes. If you need help quitting, ask your health care provider.  Do not drink alcohol.  Do not use any medicinal herbs or unprescribed drugs. These chemicals affect the formation and growth of the baby.  If you develop varicose veins:  Wear support pantyhose or compression stockings as told by your healthcare provider.  Elevate your feet for 15 minutes, 3-4 times a day.  Wear a supportive maternity bra to help with breast tenderness. General instructions  Take over-the-counter and prescription  medicines only as told by your health care provider. There are medicines that are either safe or unsafe to take during pregnancy.  Take warm sitz baths to soothe any pain or discomfort caused by hemorrhoids. Use hemorrhoid cream or witch hazel if your health care provider approves.  Avoid cat litter boxes and soil used by cats. These carry germs that can cause birth defects in the baby. If you have a cat, ask someone to clean the litter box for you.  To prepare for the arrival of your baby:  Take prenatal classes to understand, practice, and ask questions about the labor and delivery.  Make a trial run to the hospital.  Visit the hospital and tour the maternity area.  Arrange for maternity or paternity leave through employers.  Arrange for family and friends to take care of pets while you are in the hospital.  Purchase a rear-facing car seat and make sure you know how to install it in your car.  Pack your hospital bag.  Prepare the baby's nursery. Make sure to remove all pillows and stuffed animals from the baby's crib to prevent suffocation.  Visit your dentist if you have not gone during your pregnancy. Use a soft toothbrush to brush your teeth and be gentle when you floss.  Keep all prenatal follow-up visits as told by your health care provider. This is important. Contact a health care provider if:  You are unsure if you are in labor or if your water has broken.  You become dizzy.  You have mild pelvic cramps, pelvic pressure, or nagging pain in your abdominal area.  You have lower back pain.  You have persistent nausea, vomiting, or diarrhea.  You have an unusual or bad smelling vaginal discharge.  You have pain when you urinate. Get help right away if:  You have a fever.  You are leaking fluid from your vagina.  You have spotting or bleeding from your vagina.  You have severe abdominal pain or cramping.  You have rapid weight loss or weight gain.  You have  shortness of breath with chest pain.  You notice sudden or extreme swelling of your face, hands, ankles, feet, or legs.  Your baby makes fewer than 10 movements in 2 hours.  You have severe headaches that do not go away with medicine.  You have vision changes. Summary  The third trimester is from week 29 through week 40, months 7 through 9. The third trimester is a time when the unborn baby (fetus)   is growing rapidly.  During the third trimester, your discomfort may increase as you and your baby continue to gain weight. You may have abdominal, leg, and back pain, sleeping problems, and an increased need to urinate.  During the third trimester your breasts will keep growing and they will continue to become tender. A yellow fluid (colostrum) may leak from your breasts. This is the first milk you are producing for your baby.  False labor is a condition in which you feel small, irregular tightenings of the muscles in the womb (contractions) that eventually go away. These are called Braxton Hicks contractions. Contractions may last for hours, days, or even weeks before true labor sets in.  Signs of labor can include: abdominal cramps; regular contractions that start at 10 minutes apart and become stronger and more frequent with time; watery or bloody mucus discharge that comes from the vagina; increased pelvic pressure and dull back pain; and leaking of amniotic fluid. This information is not intended to replace advice given to you by your health care provider. Make sure you discuss any questions you have with your health care provider. Document Released: 11/12/2001 Document Revised: 04/25/2016 Document Reviewed: 01/19/2013 [employer] Interactive Patient Education  2017 [employer]  

## 2017-01-29 ENCOUNTER — Ambulatory Visit (INDEPENDENT_AMBULATORY_CARE_PROVIDER_SITE_OTHER): Payer: BLUE CROSS/BLUE SHIELD | Admitting: Advanced Practice Midwife

## 2017-01-29 ENCOUNTER — Encounter: Payer: Self-pay | Admitting: Advanced Practice Midwife

## 2017-01-29 VITALS — BP 100/60 | HR 80 | Temp 98.1°F | Wt 160.0 lb

## 2017-01-29 DIAGNOSIS — O09293 Supervision of pregnancy with other poor reproductive or obstetric history, third trimester: Secondary | ICD-10-CM

## 2017-01-29 DIAGNOSIS — Z3A35 35 weeks gestation of pregnancy: Secondary | ICD-10-CM

## 2017-01-29 DIAGNOSIS — Z331 Pregnant state, incidental: Secondary | ICD-10-CM

## 2017-01-29 DIAGNOSIS — Z1389 Encounter for screening for other disorder: Secondary | ICD-10-CM

## 2017-01-29 DIAGNOSIS — Z3483 Encounter for supervision of other normal pregnancy, third trimester: Secondary | ICD-10-CM

## 2017-01-29 LAB — POCT URINALYSIS DIPSTICK
GLUCOSE UA: NEGATIVE
Leukocytes, UA: NEGATIVE
Nitrite, UA: NEGATIVE
PROTEIN UA: 1
RBC UA: NEGATIVE

## 2017-01-29 MED ORDER — ONDANSETRON HCL 4 MG PO TABS: 4.0000 mg | ORAL_TABLET | Freq: Three times a day (TID) | ORAL | 1 refills | Status: DC | PRN

## 2017-01-29 NOTE — Progress Notes (Signed)
G2P1001 [redacted]w[redacted]d Estimated Date of Delivery: 03/08/17  Blood pressure 100/60, pulse 80, temperature 98.1 F (36.7 C), weight 160 lb (72.6 kg).   BP weight and urine results all reviewed and noted.  Please refer to the obstetrical flow sheet for the fundal height and fetal heart rate documentation:  Patient reports good fetal movement, denies any bleeding and no rupture of membranes symptoms or regular contractions. Patient caught GI bug from son today. Immodium/zofran.  Doesn't look too sick.  All questions were answered.  Orders Placed This Encounter  Procedures  . US OB Follow Up  . POCT urinalysis dipstick    Plan:  Continued routine obstetrical care, gets 16 weeks paid maternity leave from [employer]!  FMLA papers will be faxed here.  If GI bug lasts >48 hours, let us know.   Return in about 2 weeks (around 02/12/2017) for [facility]OB, US:OB F/U:. EFW d/t hx IUGR

## 2017-01-29 NOTE — Patient Instructions (Signed)
Fax [phone]

## 2017-02-06 ENCOUNTER — Telehealth: Payer: Self-pay | Admitting: *Deleted

## 2017-02-07 NOTE — Telephone Encounter (Signed)
Informed patient she could use Preparation H cream/wipes, Tucks pads, Anusol. Patient stated she would try these to see if they help.

## 2017-02-12 ENCOUNTER — Ambulatory Visit (INDEPENDENT_AMBULATORY_CARE_PROVIDER_SITE_OTHER): Payer: BLUE CROSS/BLUE SHIELD

## 2017-02-12 ENCOUNTER — Ambulatory Visit (INDEPENDENT_AMBULATORY_CARE_PROVIDER_SITE_OTHER): Payer: BLUE CROSS/BLUE SHIELD | Admitting: Advanced Practice Midwife

## 2017-02-12 ENCOUNTER — Encounter: Payer: Self-pay | Admitting: Advanced Practice Midwife

## 2017-02-12 VITALS — BP 102/60 | HR 82 | Wt 165.0 lb

## 2017-02-12 DIAGNOSIS — O09293 Supervision of pregnancy with other poor reproductive or obstetric history, third trimester: Secondary | ICD-10-CM | POA: Diagnosis not present

## 2017-02-12 DIAGNOSIS — Z331 Pregnant state, incidental: Secondary | ICD-10-CM

## 2017-02-12 DIAGNOSIS — Z3483 Encounter for supervision of other normal pregnancy, third trimester: Secondary | ICD-10-CM

## 2017-02-12 DIAGNOSIS — Z8759 Personal history of other complications of pregnancy, childbirth and the puerperium: Secondary | ICD-10-CM

## 2017-02-12 DIAGNOSIS — Z3A37 37 weeks gestation of pregnancy: Secondary | ICD-10-CM

## 2017-02-12 DIAGNOSIS — Z3403 Encounter for supervision of normal first pregnancy, third trimester: Secondary | ICD-10-CM

## 2017-02-12 DIAGNOSIS — Z1389 Encounter for screening for other disorder: Secondary | ICD-10-CM

## 2017-02-12 LAB — POCT URINALYSIS DIPSTICK
GLUCOSE UA: NEGATIVE
Ketones, UA: NEGATIVE
NITRITE UA: NEGATIVE
Protein, UA: NEGATIVE
RBC UA: NEGATIVE

## 2017-02-12 NOTE — Progress Notes (Signed)
[place] 41+9 wks,cephalic,post pl gr 1,bilat adnexa's wnl,afi 15.9 cm,fhr 141 bpm,EFW 2849  g 40 %

## 2017-02-12 NOTE — Progress Notes (Signed)
G2P1001 [redacted]w[redacted]d Estimated Date of Delivery: 03/08/17  Blood pressure 102/60, pulse 82, weight 165 lb (74.8 kg).   BP weight and urine results all reviewed and noted.  Please refer to the obstetrical flow sheet for the fundal height and fetal heart rate documentation:  [place] 85+9 wks,cephalic,post pl gr 1,bilat adnexa's wnl,afi 15.9 cm,fhr 141 bpm,EFW 2849  g 40 %  Patient reports good fetal movement, denies any bleeding and no rupture of membranes symptoms or regular contractions. Patient is without complaints. All questions were answered.  Orders Placed This Encounter  Procedures  . GC/Chlamydia Probe Amp  . Strep Gp B NAA+Rflx  . POCT Urinalysis Dipstick    Plan:  Continued routine obstetrical care,   Return in about 1 week (around 02/19/2017) for [place].

## 2017-02-14 LAB — GC/CHLAMYDIA PROBE AMP
CHLAMYDIA, DNA PROBE: NEGATIVE
NEISSERIA GONORRHOEAE BY PCR: NEGATIVE

## 2017-02-14 LAB — STREP GP B NAA+RFLX: STREP GP B NAA+RFLX: NEGATIVE

## 2017-02-19 ENCOUNTER — Encounter: Payer: Self-pay | Admitting: Advanced Practice Midwife

## 2017-02-19 ENCOUNTER — Ambulatory Visit (INDEPENDENT_AMBULATORY_CARE_PROVIDER_SITE_OTHER): Payer: BLUE CROSS/BLUE SHIELD | Admitting: Advanced Practice Midwife

## 2017-02-19 VITALS — BP 80/50 | HR 92 | Wt 166.3 lb

## 2017-02-19 DIAGNOSIS — O368130 Decreased fetal movements, third trimester, not applicable or unspecified: Secondary | ICD-10-CM

## 2017-02-19 DIAGNOSIS — Z3483 Encounter for supervision of other normal pregnancy, third trimester: Secondary | ICD-10-CM

## 2017-02-19 DIAGNOSIS — Z1389 Encounter for screening for other disorder: Secondary | ICD-10-CM

## 2017-02-19 DIAGNOSIS — Z331 Pregnant state, incidental: Secondary | ICD-10-CM

## 2017-02-19 LAB — POCT URINALYSIS DIPSTICK
Glucose, UA: NEGATIVE
Ketones, UA: NEGATIVE
Leukocytes, UA: NEGATIVE
Nitrite, UA: NEGATIVE
PROTEIN UA: NEGATIVE
RBC UA: NEGATIVE

## 2017-02-19 NOTE — Progress Notes (Signed)
G2P1001 [redacted]w[redacted]d Estimated Date of Delivery: 03/08/17  Blood pressure (!) 80/50, pulse 92, weight 166 lb 4.8 oz (75.4 kg).   BP weight and urine results all reviewed and noted.  Please refer to the obstetrical flow sheet for the fundal height and fetal heart rate documentation:  Patient reports decreased fetal movement, denies any bleeding and no rupture of membranes symptoms or regular contractions. Patient is without complaints. All questions were answered. Cx very posterior NST reactive  Orders Placed This Encounter  Procedures  . POCT urinalysis dipstick    Plan:  Continued routine obstetrical care,   Return in about 1 week (around 02/26/2017) for LROB.

## 2017-02-19 NOTE — Patient Instructions (Addendum)
AM I IN LABOR? What is labor? Labor is the work that your body does to birth your baby. Your uterus (the womb) contracts. Your cervix (the mouth of the uterus) opens. You will push your baby out into the world.  What do contractions (labor pains) feel like? When they first start, contractions usually feel like cramps during your period. Sometimes you feel pain in your back. Most often, contractions feel like muscles pulling painfully in your lower belly. At first, the contractions will probably be 15 to 20 minutes apart. They will not feel too painful. As labor goes on, the contractions get stronger, closer together, and more painful.  How do I time the contractions? Time your contractions by counting the number of minutes from the start of one contraction to the start of the next contraction.  What should I do when the contractions start? If it is night and you can sleep, sleep. If it happens during the day, here are some things you can do to take care of yourself at home: ? Walk. If the pains you are having are real labor, walking will make the contractions come faster and harder. If the contractions are not going to continue and be real labor, walking will make the contractions slow down. ? Take a shower or bath. This will help you relax. ? Eat. Labor is a big event. It takes a lot of energy. ? Drink water. Not drinking enough water can cause false labor (contractions that hurt but do not open your cervix). If this is true labor, drinking water will help you have strength to get through your labor. ? Take a nap. Get all the rest you can. ? Get a massage. If your labor is in your back, a strong massage on your lower back may feel very good. Getting a foot massage is always good. ? Don't panic. You can do this. Your body was made for this. You are strong!  When should I go to the hospital or call my health care provider? ? Your contractions have been 5 minutes apart or less for at least 1  hour. ? If several contractions are so painful you cannot walk or talk during one. ? Your bag of waters breaks. (You may have a big gush of water or just water that runs down your legs when you walk.)  Are there other reasons to call my health care provider? Yes, you should call your health care provider or go to the hospital if you start to bleed like you are having a period- blood that soaks your underwear or runs down your legs, if you have sudden severe pain, if your baby has not moved for several hours, or if you are leaking green fluid. The rule is as follows: If you are very concerned about something, call.   Safe Medications in Pregnancy   Acne: Benzoyl Peroxide Salicylic Acid  Backache/Headache: Tylenol: 2 regular strength every 4 hours OR              2 Extra strength every 6 hours  Colds/Coughs/Allergies: Benadryl (alcohol free) 25 mg every 6 hours as needed Breath right strips Claritin Cepacol throat lozenges Chloraseptic throat spray Cold-Eeze- up to three times per day Cough drops, alcohol free Flonase (by prescription only) Guaifenesin Mucinex Robitussin DM (plain only, alcohol free) Saline nasal spray/drops Sudafed (pseudoephedrine) & Actifed ** use only after [redacted] weeks gestation and if you do not have high blood pressure Tylenol Vicks Vaporub Zinc lozenges Zyrtec     Constipation: Colace Ducolax suppositories Fleet enema Glycerin suppositories Metamucil Milk of magnesia Miralax Senokot Smooth move tea  Diarrhea: Kaopectate Imodium A-D  *NO pepto Bismol  Hemorrhoids: Anusol Anusol HC Preparation H Tucks  Indigestion: Tums Maalox Mylanta Zantac  Pepcid  Insomnia: Benadryl (alcohol free) 25mg every 6 hours as needed Tylenol PM Unisom, no Gelcaps  Leg Cramps: Tums MagGel  Nausea/Vomiting:  Bonine Dramamine Emetrol Ginger extract Sea bands Meclizine  Nausea medication to take during pregnancy:  Unisom (doxylamine succinate  25 mg tablets) Take one tablet daily at bedtime. If symptoms are not adequately controlled, the dose can be increased to a maximum recommended dose of two tablets daily (1/2 tablet in the morning, 1/2 tablet mid-afternoon and one at bedtime). Vitamin B6 100mg tablets. Take one tablet twice a day (up to 200 mg per day).  Skin Rashes: Aveeno products Benadryl cream or 25mg every 6 hours as needed Calamine Lotion 1% cortisone cream  Yeast infection: Gyne-lotrimin 7 Monistat 7   **If taking multiple medications, please check labels to avoid duplicating the same active ingredients **take medication as directed on the label ** Do not exceed 4000 mg of tylenol in 24 hours **Do not take medications that contain aspirin or ibuprofen     

## 2017-02-20 ENCOUNTER — Telehealth: Payer: Self-pay | Admitting: *Deleted

## 2017-02-20 ENCOUNTER — Inpatient Hospital Stay (HOSPITAL_COMMUNITY)
Admission: AD | Admit: 2017-02-20 | Discharge: 2017-02-20 | Disposition: A | Discharge status: Home / Self Care | Payer: Medicaid Other | Source: Ambulatory Visit | Attending: Obstetrics & Gynecology | Admitting: Obstetrics & Gynecology

## 2017-02-20 ENCOUNTER — Encounter (HOSPITAL_COMMUNITY): Payer: Self-pay | Admitting: *Deleted

## 2017-02-20 DIAGNOSIS — O479 False labor, unspecified: Secondary | ICD-10-CM | POA: Diagnosis not present

## 2017-02-20 DIAGNOSIS — Z3483 Encounter for supervision of other normal pregnancy, third trimester: Secondary | ICD-10-CM

## 2017-02-20 DIAGNOSIS — Z3A00 Weeks of gestation of pregnancy not specified: Secondary | ICD-10-CM | POA: Diagnosis not present

## 2017-02-20 DIAGNOSIS — Z8759 Personal history of other complications of pregnancy, childbirth and the puerperium: Secondary | ICD-10-CM

## 2017-02-20 LAB — URINALYSIS, ROUTINE W REFLEX MICROSCOPIC
BILIRUBIN URINE: NEGATIVE
Glucose, UA: NEGATIVE mg/dL
Hgb urine dipstick: NEGATIVE
Ketones, ur: 5 mg/dL — AB
LEUKOCYTES UA: NEGATIVE
NITRITE: NEGATIVE
PROTEIN: NEGATIVE mg/dL
SPECIFIC GRAVITY, URINE: 1.013 (ref 1.005–1.030)
pH: 7 (ref 5.0–8.0)

## 2017-02-20 NOTE — Telephone Encounter (Signed)
Patient called stating she is still having back pain and pain near her rib cage. She also states she has not felt the baby move since 1. Patient states she has eaten but has been constantly moving. Advised patient to try to sit still for a few minutes to try to focus on movement. If she does not feel movement (10 in 2 hours) then she needed to go to [facility]. Also encouraged hands and knees or exaggerated position to change baby's position to help with back pain. Pt verbalized understanding.

## 2017-02-20 NOTE — Discharge Instructions (Signed)

## 2017-02-20 NOTE — Telephone Encounter (Signed)
Patient called with complaints of right upper quadrant pain. She was just seen in MAU this am for back pain. She states all vital signs were good, baby monitoring was fine but was having some contractions. I informed patient that she could contract for several more weeks before cervical change begins. She has no blurred vision, headaches or issues with BP. Advised patient to go home, rest, push fluids and put a pillow under her abdomin for support but to call us back if pain get worse. Patient verbalized understanding.

## 2017-02-20 NOTE — MAU Note (Signed)
Pt presents to MAU with complaints of lower back pain that started while  she was at work this morning. Denies any vaginal bleeding or LOF

## 2017-02-26 ENCOUNTER — Ambulatory Visit (INDEPENDENT_AMBULATORY_CARE_PROVIDER_SITE_OTHER): Payer: BLUE CROSS/BLUE SHIELD | Admitting: Obstetrics and Gynecology

## 2017-02-26 ENCOUNTER — Encounter: Payer: Self-pay | Admitting: Obstetrics and Gynecology

## 2017-02-26 VITALS — BP 100/60 | HR 80 | Wt 169.4 lb

## 2017-02-26 DIAGNOSIS — Z331 Pregnant state, incidental: Secondary | ICD-10-CM

## 2017-02-26 DIAGNOSIS — Z1389 Encounter for screening for other disorder: Secondary | ICD-10-CM

## 2017-02-26 DIAGNOSIS — O26843 Uterine size-date discrepancy, third trimester: Secondary | ICD-10-CM

## 2017-02-26 DIAGNOSIS — Z3A38 38 weeks gestation of pregnancy: Secondary | ICD-10-CM

## 2017-02-26 DIAGNOSIS — Z3483 Encounter for supervision of other normal pregnancy, third trimester: Secondary | ICD-10-CM

## 2017-02-26 LAB — POCT URINALYSIS DIPSTICK
Glucose, UA: NEGATIVE
KETONES UA: NEGATIVE
Leukocytes, UA: NEGATIVE
NITRITE UA: NEGATIVE
Protein, UA: NEGATIVE
RBC UA: NEGATIVE

## 2017-02-26 NOTE — Progress Notes (Signed)
Patient ID: Aimee Burns, female   DOB: 04/22/1991, 26 y.o.   MRN: [id number] G2P1001 [redacted]w[redacted]d Estimated Date of Delivery: 03/08/17 LROB  Patient reports good fetal movement, denies any bleeding and no rupture of membranes symptoms or regular contractions. Patient complaints: she notes that she was informed that she was 4 cm dilated during her last visit. Pt reports associated intermittent cramping and lower back pain. Pt hasn't tried any medications for her symptoms. Denies any other symptoms.  Blood pressure 100/60, pulse 80, weight 169 lb 6.4 oz (76.8 kg). refer to the ob flow sheet for FH and FHR, also BP, Wt, Urine results:notable for negative.                          Physical Examination:  General appearance - alert, well appearing, and in no distress Abdomen - FH 36 cm                  -FHR 151 soft, nontender, nondistended, no masses or organomegaly CERVIX: 2 cm and long.                                 Questions were answered. Assessment: LROB G2P1001 @ [redacted]w[redacted]d  Plan:  Continued routine obstetrical care, lab work completed today.  F/u in 1 week for routine OB  By signing my name below, I, Soijett Blue, attest that this documentation has been prepared under the direction and in the presence of Jonnie Kind, MD. Electronically Signed: Hess[place], ED Scribe. 02/26/17. 9:52 AM.  I personally performed the services described in this documentation, which was SCRIBED in my presence. The recorded information has been reviewed and considered accurate. It has been edited as necessary during review. Jonnie Kind, MD

## 2017-02-27 LAB — HEPATITIS B SURFACE ANTIGEN: Hepatitis B Surface Ag: NEGATIVE

## 2017-02-27 LAB — RUBELLA SCREEN: Rubella Antibodies, IGG: 17.8 index (ref >0.99)

## 2017-02-27 LAB — PMP SCREEN PROFILE (10S), URINE
Amphetamine Screen, Ur: NEGATIVE ng/mL
BENZODIAZEPINE SCREEN, URINE: NEGATIVE ng/mL
Barbiturate Screen, Ur: NEGATIVE ng/mL
CREATININE(CRT), U: 57.2 mg/dL (ref 20.0–300.0)
Cannabinoids Ur Ql Scn: NEGATIVE ng/mL
Cocaine(Metab.)Screen, Urine: NEGATIVE ng/mL
METHADONE SCREEN, URINE: NEGATIVE ng/mL
OXYCODONE+OXYMORPHONE UR QL SCN: NEGATIVE ng/mL
Opiate Scrn, Ur: NEGATIVE ng/mL
PCP SCRN UR: NEGATIVE ng/mL
PH UR, DRUG SCRN: 7.5 (ref 4.5–8.9)
Propoxyphene, Screen: NEGATIVE ng/mL

## 2017-02-27 LAB — VARICELLA ZOSTER ANTIBODY, IGG: VARICELLA: 243 {index} (ref >165)

## 2017-03-01 ENCOUNTER — Encounter (HOSPITAL_COMMUNITY): Payer: Self-pay

## 2017-03-01 ENCOUNTER — Inpatient Hospital Stay (HOSPITAL_COMMUNITY)
Admission: AD | Admit: 2017-03-01 | Discharge: 2017-03-01 | Disposition: A | Discharge status: Home / Self Care | Payer: BLUE CROSS/BLUE SHIELD | Source: Ambulatory Visit | Attending: Obstetrics & Gynecology | Admitting: Obstetrics & Gynecology

## 2017-03-01 DIAGNOSIS — Z8349 Family history of other endocrine, nutritional and metabolic diseases: Secondary | ICD-10-CM | POA: Diagnosis not present

## 2017-03-01 DIAGNOSIS — Z3A39 39 weeks gestation of pregnancy: Secondary | ICD-10-CM | POA: Diagnosis not present

## 2017-03-01 DIAGNOSIS — N898 Other specified noninflammatory disorders of vagina: Secondary | ICD-10-CM

## 2017-03-01 DIAGNOSIS — Z880 Allergy status to penicillin: Secondary | ICD-10-CM | POA: Insufficient documentation

## 2017-03-01 DIAGNOSIS — Z87442 Personal history of urinary calculi: Secondary | ICD-10-CM | POA: Insufficient documentation

## 2017-03-01 DIAGNOSIS — O26893 Other specified pregnancy related conditions, third trimester: Secondary | ICD-10-CM | POA: Diagnosis not present

## 2017-03-01 LAB — URINALYSIS, ROUTINE W REFLEX MICROSCOPIC
Bilirubin Urine: NEGATIVE
Glucose, UA: 50 mg/dL — AB
Hgb urine dipstick: NEGATIVE
Ketones, ur: 5 mg/dL — AB
LEUKOCYTES UA: NEGATIVE
NITRITE: NEGATIVE
PROTEIN: NEGATIVE mg/dL
SPECIFIC GRAVITY, URINE: 1.014 (ref 1.005–1.030)
pH: 6 (ref 5.0–8.0)

## 2017-03-01 LAB — WET PREP, GENITAL
Clue Cells Wet Prep HPF POC: NONE SEEN
SPERM: NONE SEEN
TRICH WET PREP: NONE SEEN
YEAST WET PREP: NONE SEEN

## 2017-03-01 LAB — AMNISURE RUPTURE OF MEMBRANE (ROM) NOT AT ARMC: Amnisure ROM: NEGATIVE

## 2017-03-01 NOTE — MAU Provider Note (Signed)
History    G2P1001 @ 39 wks in with c/o leaking clear vag discharge since this morning. States good fetal movement. Denies vag bleeding. CSN: [id number]  Arrival date & time 03/01/17  1705   None     No chief complaint on file.   HPI  Past Medical History:  Diagnosis Date  . Nexplanon insertion 07/08/2013   Placed in left arm 07/08/13 remove 07/08/16  . Pregnancy   . Renal calculi    2011,right parathyroidectomy  . Thyroid tumor, benign     Past Surgical History:  Procedure Laterality Date  . Parathyroid gland removed    . PARATHYROIDECTOMY / EXPLORATION OF PARATHYROIDS Right 2011   [place], at [place]  . THYROIDECTOMY, PARTIAL Right 2011    Family History  Problem Relation Age of Onset  . Diabetes Paternal Grandmother   . Cancer Paternal Grandmother     lung  . Thyroid disease Mother     Social History  Substance Use Topics  . Smoking status: Never Smoker  . Smokeless tobacco: Never Used  . Alcohol use No    OB History    Gravida Para Term Preterm AB Living   2 1 1     1    SAB TAB Ectopic Multiple Live Births           1      Review of Systems  Constitutional: Negative.   HENT: Negative.   Eyes: Negative.   Respiratory: Negative.   Cardiovascular: Negative.   Gastrointestinal: Negative.   Endocrine: Negative.   Genitourinary: Positive for vaginal discharge.  Musculoskeletal: Negative.   Allergic/Immunologic: Negative.   Neurological: Negative.   Hematological: Negative.   Psychiatric/Behavioral: Negative.     Allergies  Amoxicillin  Home Medications    LMP  (LMP Unknown)   Physical Exam  MAU Course  Procedures (including critical care time)  Labs Reviewed  WET PREP, GENITAL  URINALYSIS, ROUTINE W REFLEX MICROSCOPIC  GC/CHLAMYDIA PROBE AMP ([place]) NOT AT [facility]   No results found.   No diagnosis found.    MDM  FHR pattern stable. Wet prep neg. GC Chla done. amnisure neg VSS, no active leaking with exam, denies pain. Will  d/c home

## 2017-03-01 NOTE — MAU Note (Signed)
Pt to MAU with complaint of vaginal discharge twice today that she changed her underwear. Denies vaginal bleeding and pain.

## 2017-03-03 LAB — GC/CHLAMYDIA PROBE AMP (~~LOC~~) NOT AT ARMC
CHLAMYDIA, DNA PROBE: NEGATIVE
NEISSERIA GONORRHEA: NEGATIVE

## 2017-03-05 ENCOUNTER — Encounter (HOSPITAL_COMMUNITY): Payer: Self-pay

## 2017-03-05 ENCOUNTER — Encounter: Payer: Self-pay | Admitting: Advanced Practice Midwife

## 2017-03-05 ENCOUNTER — Inpatient Hospital Stay (HOSPITAL_COMMUNITY)
Admission: AD | Admit: 2017-03-05 | Discharge: 2017-03-07 | DRG: 775 | Disposition: A | Discharge status: Home / Self Care | Payer: BLUE CROSS/BLUE SHIELD | Source: Ambulatory Visit | Attending: Obstetrics and Gynecology | Admitting: Obstetrics and Gynecology

## 2017-03-05 ENCOUNTER — Inpatient Hospital Stay (HOSPITAL_COMMUNITY): Payer: BLUE CROSS/BLUE SHIELD | Admitting: Anesthesiology

## 2017-03-05 ENCOUNTER — Ambulatory Visit (INDEPENDENT_AMBULATORY_CARE_PROVIDER_SITE_OTHER): Payer: BLUE CROSS/BLUE SHIELD | Admitting: Advanced Practice Midwife

## 2017-03-05 VITALS — BP 100/60 | HR 84 | Wt 170.0 lb

## 2017-03-05 DIAGNOSIS — Z3483 Encounter for supervision of other normal pregnancy, third trimester: Secondary | ICD-10-CM

## 2017-03-05 DIAGNOSIS — Z1389 Encounter for screening for other disorder: Secondary | ICD-10-CM | POA: Diagnosis not present

## 2017-03-05 DIAGNOSIS — O368130 Decreased fetal movements, third trimester, not applicable or unspecified: Secondary | ICD-10-CM | POA: Diagnosis not present

## 2017-03-05 DIAGNOSIS — Z833 Family history of diabetes mellitus: Secondary | ICD-10-CM

## 2017-03-05 DIAGNOSIS — Z331 Pregnant state, incidental: Secondary | ICD-10-CM

## 2017-03-05 DIAGNOSIS — Z3A39 39 weeks gestation of pregnancy: Secondary | ICD-10-CM

## 2017-03-05 DIAGNOSIS — Z6791 Unspecified blood type, Rh negative: Secondary | ICD-10-CM | POA: Diagnosis not present

## 2017-03-05 DIAGNOSIS — O360930 Maternal care for other rhesus isoimmunization, third trimester, not applicable or unspecified: Secondary | ICD-10-CM | POA: Diagnosis not present

## 2017-03-05 DIAGNOSIS — Z3493 Encounter for supervision of normal pregnancy, unspecified, third trimester: Secondary | ICD-10-CM | POA: Diagnosis present

## 2017-03-05 DIAGNOSIS — O26893 Other specified pregnancy related conditions, third trimester: Secondary | ICD-10-CM | POA: Diagnosis present

## 2017-03-05 DIAGNOSIS — D649 Anemia, unspecified: Secondary | ICD-10-CM | POA: Diagnosis not present

## 2017-03-05 DIAGNOSIS — O9081 Anemia of the puerperium: Secondary | ICD-10-CM | POA: Diagnosis not present

## 2017-03-05 LAB — POCT URINALYSIS DIPSTICK
Glucose, UA: NEGATIVE
Ketones, UA: NEGATIVE
LEUKOCYTES UA: NEGATIVE
NITRITE UA: NEGATIVE
PROTEIN UA: NEGATIVE
RBC UA: NEGATIVE

## 2017-03-05 LAB — CBC
HCT: 37.7 % (ref 36.0–46.0)
Hemoglobin: 12.9 g/dL (ref 12.0–15.0)
MCH: 31.9 pg (ref 26.0–34.0)
MCHC: 34.2 g/dL (ref 30.0–36.0)
MCV: 93.1 fL (ref 78.0–100.0)
Platelets: 283 10*3/uL (ref 150–400)
RBC: 4.05 MIL/uL (ref 3.87–5.11)
RDW: 13.5 % (ref 11.5–15.5)
WBC: 15.7 10*3/uL — AB (ref 4.0–10.5)

## 2017-03-05 MED ORDER — OXYTOCIN BOLUS FROM INFUSION
500.0000 mL | Freq: Once | INTRAVENOUS | Status: AC
  Administered 2017-03-06: 500 mL via INTRAVENOUS

## 2017-03-05 MED ORDER — LACTATED RINGERS IV SOLN
INTRAVENOUS | Status: DC
  Administered 2017-03-05: 23:00:00 via INTRAVENOUS

## 2017-03-05 MED ORDER — PHENYLEPHRINE 40 MCG/ML (10ML) SYRINGE FOR IV PUSH (FOR BLOOD PRESSURE SUPPORT)
80.0000 ug | PREFILLED_SYRINGE | INTRAVENOUS | Status: DC | PRN
  Filled 2017-03-05: qty 10
  Filled 2017-03-05: qty 5

## 2017-03-05 MED ORDER — EPHEDRINE 5 MG/ML INJ
10.0000 mg | INTRAVENOUS | Status: DC | PRN
  Filled 2017-03-05: qty 2

## 2017-03-05 MED ORDER — LIDOCAINE HCL (PF) 1 % IJ SOLN
INTRAMUSCULAR | Status: DC | PRN
  Administered 2017-03-05: 13 mL via EPIDURAL

## 2017-03-05 MED ORDER — DIPHENHYDRAMINE HCL 50 MG/ML IJ SOLN: 12.5000 mg | INTRAMUSCULAR | Status: DC | PRN

## 2017-03-05 MED ORDER — LIDOCAINE HCL (PF) 1 % IJ SOLN
30.0000 mL | INTRAMUSCULAR | Status: DC | PRN
  Filled 2017-03-05: qty 30

## 2017-03-05 MED ORDER — SOD CITRATE-CITRIC ACID 500-334 MG/5ML PO SOLN: 30.0000 mL | ORAL | Status: DC | PRN

## 2017-03-05 MED ORDER — LACTATED RINGERS IV SOLN
500.0000 mL | Freq: Once | INTRAVENOUS | Status: AC
  Administered 2017-03-05: 500 mL via INTRAVENOUS

## 2017-03-05 MED ORDER — PHENYLEPHRINE 40 MCG/ML (10ML) SYRINGE FOR IV PUSH (FOR BLOOD PRESSURE SUPPORT)
80.0000 ug | PREFILLED_SYRINGE | INTRAVENOUS | Status: DC | PRN
  Filled 2017-03-05: qty 5

## 2017-03-05 MED ORDER — ONDANSETRON HCL 4 MG/2ML IJ SOLN: 4.0000 mg | Freq: Four times a day (QID) | INTRAMUSCULAR | Status: DC | PRN

## 2017-03-05 MED ORDER — ACETAMINOPHEN 325 MG PO TABS: 650.0000 mg | ORAL_TABLET | ORAL | Status: DC | PRN

## 2017-03-05 MED ORDER — LACTATED RINGERS IV SOLN: 500.0000 mL | INTRAVENOUS | Status: DC | PRN

## 2017-03-05 MED ORDER — OXYCODONE-ACETAMINOPHEN 5-325 MG PO TABS: 2.0000 | ORAL_TABLET | ORAL | Status: DC | PRN

## 2017-03-05 MED ORDER — OXYCODONE-ACETAMINOPHEN 5-325 MG PO TABS: 1.0000 | ORAL_TABLET | ORAL | Status: DC | PRN

## 2017-03-05 MED ORDER — FENTANYL 2.5 MCG/ML BUPIVACAINE 1/10 % EPIDURAL INFUSION (WH - ANES)
14.0000 mL/h | INTRAMUSCULAR | Status: DC | PRN
  Administered 2017-03-05: 14 mL/h via EPIDURAL
  Filled 2017-03-05: qty 100

## 2017-03-05 MED ORDER — NALBUPHINE HCL 10 MG/ML IJ SOLN: 5.0000 mg | INTRAMUSCULAR | Status: DC | PRN

## 2017-03-05 MED ORDER — OXYTOCIN 40 UNITS IN LACTATED RINGERS INFUSION - SIMPLE MED
2.5000 [IU]/h | INTRAVENOUS | Status: DC
  Filled 2017-03-05: qty 1000

## 2017-03-05 NOTE — Anesthesia Preprocedure Evaluation (Signed)
Anesthesia Evaluation  Patient identified by MRN, date of birth, ID band Patient awake    Reviewed: Allergy & Precautions, H&P , NPO status , Patient's Chart, lab work & pertinent test results  Airway Mallampati: II  TM Distance: >3 FB Neck ROM: full    Dental no notable dental hx.    Pulmonary neg pulmonary ROS,    Pulmonary exam normal        Cardiovascular negative cardio ROS Normal cardiovascular exam     Neuro/Psych negative neurological ROS  negative psych ROS   GI/Hepatic negative GI ROS, Neg liver ROS,   Endo/Other  negative endocrine ROS  Renal/GU   negative genitourinary   Musculoskeletal negative musculoskeletal ROS (+)   Abdominal Normal abdominal exam  (+)   Peds negative pediatric ROS (+)  Hematology negative hematology ROS (+)   Anesthesia Other Findings   Reproductive/Obstetrics (+) Pregnancy                             Anesthesia Physical  Anesthesia Plan  ASA: II  Anesthesia Plan: Epidural   Post-op Pain Management:    Induction:   Airway Management Planned:   Additional Equipment:   Intra-op Plan:   Post-operative Plan:   Informed Consent: I have reviewed the patients History and Physical, chart, labs and discussed the procedure including the risks, benefits and alternatives for the proposed anesthesia with the patient or authorized representative who has indicated his/her understanding and acceptance.     Plan Discussed with:   Anesthesia Plan Comments:         Anesthesia Quick Evaluation

## 2017-03-05 NOTE — H&P (Signed)
LABOR ADMISSION HISTORY AND PHYSICAL  Aimee Burns is a 26 y.o. female G2P1001 with IUP at [redacted]w[redacted]d by early sono presenting for contraction. She reports regular contraction every 3 minutes. Fetal movement present and at baseline. Denies fluid leak or gush, vaginal bleeding,  headaches, blurry vision, RUQ pain or peripheral edema. She plans on breast feeding. She request depo for birth control.  Plans on bringing child to [facility] for pediatric care after discharge.   Dating: By early [place] --->  Estimated Date of Delivery: 03/08/17  Sono on 02-18-2017  26 year old G2P1@ [redacted]w[redacted]d, CWD, normal anatomy, vertex presentation,  2849g, 40% EFW   Prenatal History/Complications:  Past Medical History: Past Medical History:  Diagnosis Date  . Nexplanon insertion 07/08/2013   Placed in left arm 07/08/13 remove 07/08/16  . Pregnancy   . Renal calculi    2011,right parathyroidectomy  . Thyroid tumor, benign     Past Surgical History: Past Surgical History:  Procedure Laterality Date  . Parathyroid gland removed    . PARATHYROIDECTOMY / EXPLORATION OF PARATHYROIDS Right 2011   [place], at [place]  . THYROIDECTOMY, PARTIAL Right 2011    Obstetrical History: OB History    Gravida Para Term Preterm AB Living   2 1 1     1    SAB TAB Ectopic Multiple Live Births           1      Social History: Social History   Social History  . Marital status: Single    Spouse name: N/A  . Number of children: N/A  . Years of education: N/A   Social History Main Topics  . Smoking status: Never Smoker  . Smokeless tobacco: Never Used  . Alcohol use No  . Drug use: No  . Sexual activity: Yes    Birth control/ protection: None   Other Topics Concern  . None   Social History Narrative  . None    Family History: Family History  Problem Relation Age of Onset  . Diabetes Paternal Grandmother   . Cancer Paternal Grandmother     lung  . Thyroid disease Mother     Allergies: Allergies   Allergen Reactions  . Amoxicillin Rash    Has patient had a PCN reaction causing immediate rash, facial/tongue/throat swelling, SOB or lightheadedness with hypotension: Yes Has patient had a PCN reaction causing severe rash involving mucus membranes or skin necrosis: No Has patient had a PCN reaction that required hospitalization No Has patient had a PCN reaction occurring within the last 10 years: Yes If all of the above answers are "NO", then may proceed with Cephalosporin use.     Prescriptions Prior to Admission  Medication Sig Dispense Refill Last Dose  . diphenhydrAMINE (BENADRYL) 25 mg capsule Take 25 mg by mouth every 6 (six) hours as needed for sleep.   Not Taking  . docusate sodium (COLACE) 100 MG capsule Take 2 capsules (200 mg total) by mouth 2 (two) times daily as needed. (Patient not taking: Reported on 02/19/2017) 30 capsule 0 Not Taking  . Prenatal Vit-Fe Fumarate-FA (MULTIVITAMIN-PRENATAL) 27-0.8 MG TABS tablet Take 1 tablet by mouth daily at 12 noon.   Taking     Review of Systems  Blood pressure (!) 132/98, temperature 98.5 F (36.9 C), temperature source Oral, resp. rate 20, height 5\' 7"  (1.702 m), weight 170 lb (77.1 kg). GEN: appearance: alert, cooperative and appears stated age RESP: clear to auscultation bilaterally, no increased WOB CVS:: regular rate  and rhythm, no murmurs, no sign of DVT, +2 DP GI: soft, non-tender; bowel sounds normal MSK: WWP, Homans sign is negative,  NEURO: grossly intact PSYCH:  Pelvic Exam: Cervical exam: Dilation: 6 Effacement (%): 90 Station: -2 Exam by:: [employer] RN  Presentation: cephalic Uterine activityDate/time of onset: 03/05/2017 at 2000, Frequency: Every 3 minutes and Duration: 30 seconds  Fetal monitoringBaseline: 150 bpm, Variability: Good {> 6 bpm), Accelerations: Reactive and Decelerations: Absent  Prenatal labs: ABO, Rh:   Antibody: Negative (01/24 0912) Rubella: !Error! RPR: Non Reactive (01/24 0912)   HBsAg: Negative (03/28 1013)  HIV: Non Reactive (01/24 0912)  GBS:   negative 1 hr Glucola normal Genetic screening  negative Anatomy US normal  Prenatal Transfer Tool  Maternal Diabetes: No Genetic Screening: Normal Maternal Ultrasounds/Referrals: Normal Fetal Ultrasounds or other Referrals:  None Maternal Substance Abuse:  No Significant Maternal Medications:  None Significant Maternal Lab Results: None  Results for orders placed or performed in visit on 03/05/17 (from the past 24 hour(s))  POCT urinalysis dipstick   Collection Time: 03/05/17 10:11 AM  Result Value Ref Range   Color, UA     Clarity, UA     Glucose, UA neg    Bilirubin, UA     Ketones, UA neg    Spec Grav, UA  1.030 - 1.035   Blood, UA neg    pH, UA  5.0 - 8.0   Protein, UA neg    Urobilinogen, UA  Negative - 2.0   Nitrite, UA neg    Leukocytes, UA Negative Negative    Patient Active Problem List   Diagnosis Date Noted  . Normal labor 03/05/2017  . Rh negative state in antepartum period 12/25/2016  . Supervision of normal pregnancy 08/07/2016  . History of prior pregnancy with IUGR newborn 08/07/2016  . Renal calculi 02/11/2013  . Thyroid tumor, benign 02/11/2013    Assessment: Aimee Burns is a 26 y.o. G2P1001 at [redacted]w[redacted]d here for labor pain  #Labor: expectant #Pain: epidural #FWB: CAT-1 #ID: GBS negative #MOF: breast #MOC:Depo #Circ:  outpatient  Mercy Riding 03/05/2017, 11:24 PM  I confirm that I have verified the information documented in the resident's note and that I have also personally performed the physical exam and all medical decision making activities.  Maye Hides CNM

## 2017-03-05 NOTE — Patient Instructions (Signed)

## 2017-03-05 NOTE — MAU Note (Signed)
Patient presents with ctxs every 2-3 mins. Patient denies any LOF. Fetus active.

## 2017-03-05 NOTE — Progress Notes (Signed)
G2P1001 [redacted]w[redacted]d Estimated Date of Delivery: 03/08/17  Blood pressure 100/60, pulse 84, weight 170 lb (77.1 kg).   BP weight and urine results all reviewed and noted.  Please refer to the obstetrical flow sheet for the fundal height and fetal heart rate documentation:  Patient reports decreased fetal movement today, denies any bleeding and no rupture of membranes symptoms or regular contractions.   NST reactive  Pt's cx was definitely 4-5 cms, very posterior.  Membranes swept, BBOW felt, now about 5 cms.    Patient is without complaints. All questions were answered.  Orders Placed This Encounter  Procedures  . POCT urinalysis dipstick    Plan:  Continued routine obstetrical care,   Return in about 1 week (around 03/12/2017) for LROB.

## 2017-03-05 NOTE — Anesthesia Procedure Notes (Signed)
Epidural Patient location during procedure: OB Start time: 03/05/2017 11:37 PM End time: 03/05/2017 11:53 PM  Staffing Anesthesiologist: Candida Peeling RAY Performed: anesthesiologist   Preanesthetic Checklist Completed: patient identified, site marked, surgical consent, pre-op evaluation, timeout performed, IV checked, risks and benefits discussed and monitors and equipment checked  Epidural Patient position: sitting Prep: DuraPrep Patient monitoring: heart rate, cardiac monitor, continuous pulse ox and blood pressure Approach: midline Location: L2-L3 Injection technique: LOR saline  Needle:  Needle type: Tuohy  Needle gauge: 17 G Needle length: 9 cm Needle insertion depth: 4.5 cm Catheter type: closed end flexible Catheter size: 20 Guage Catheter at skin depth: 8.5 cm Test dose: negative  Assessment Events: blood not aspirated, injection not painful, no injection resistance, negative IV test and no paresthesia  Additional Notes Reason for block:procedure for pain

## 2017-03-06 ENCOUNTER — Encounter (HOSPITAL_COMMUNITY): Payer: Self-pay | Admitting: *Deleted

## 2017-03-06 DIAGNOSIS — O360930 Maternal care for other rhesus isoimmunization, third trimester, not applicable or unspecified: Secondary | ICD-10-CM

## 2017-03-06 DIAGNOSIS — Z3A39 39 weeks gestation of pregnancy: Secondary | ICD-10-CM

## 2017-03-06 MED ORDER — WITCH HAZEL-GLYCERIN EX PADS: 1.0000 "application " | MEDICATED_PAD | CUTANEOUS | Status: DC | PRN

## 2017-03-06 MED ORDER — ONDANSETRON HCL 4 MG/2ML IJ SOLN: 4.0000 mg | INTRAMUSCULAR | Status: DC | PRN

## 2017-03-06 MED ORDER — TETANUS-DIPHTH-ACELL PERTUSSIS 5-2.5-18.5 LF-MCG/0.5 IM SUSP: 0.5000 mL | Freq: Once | INTRAMUSCULAR | Status: DC

## 2017-03-06 MED ORDER — COCONUT OIL OIL
1.0000 "application " | TOPICAL_OIL | Status: DC | PRN
  Administered 2017-03-06: 1 via TOPICAL
  Filled 2017-03-06: qty 120

## 2017-03-06 MED ORDER — IBUPROFEN 600 MG PO TABS
600.0000 mg | ORAL_TABLET | Freq: Four times a day (QID) | ORAL | Status: DC
  Administered 2017-03-06 – 2017-03-07 (×6): 600 mg via ORAL
  Filled 2017-03-06 (×6): qty 1

## 2017-03-06 MED ORDER — BENZOCAINE-MENTHOL 20-0.5 % EX AERO
1.0000 "application " | INHALATION_SPRAY | CUTANEOUS | Status: DC | PRN
  Administered 2017-03-06: 1 via TOPICAL
  Filled 2017-03-06: qty 56

## 2017-03-06 MED ORDER — ONDANSETRON HCL 4 MG PO TABS: 4.0000 mg | ORAL_TABLET | ORAL | Status: DC | PRN

## 2017-03-06 MED ORDER — SIMETHICONE 80 MG PO CHEW: 80.0000 mg | CHEWABLE_TABLET | ORAL | Status: DC | PRN

## 2017-03-06 MED ORDER — DIPHENHYDRAMINE HCL 25 MG PO CAPS: 25.0000 mg | ORAL_CAPSULE | Freq: Four times a day (QID) | ORAL | Status: DC | PRN

## 2017-03-06 MED ORDER — DIBUCAINE 1 % RE OINT: 1.0000 "application " | TOPICAL_OINTMENT | RECTAL | Status: DC | PRN

## 2017-03-06 MED ORDER — PRENATAL MULTIVITAMIN CH
1.0000 | ORAL_TABLET | Freq: Every day | ORAL | Status: DC
  Administered 2017-03-06 – 2017-03-07 (×2): 1 via ORAL
  Filled 2017-03-06 (×2): qty 1

## 2017-03-06 MED ORDER — ACETAMINOPHEN 325 MG PO TABS: 650.0000 mg | ORAL_TABLET | ORAL | Status: DC | PRN

## 2017-03-06 MED ORDER — ZOLPIDEM TARTRATE 5 MG PO TABS: 5.0000 mg | ORAL_TABLET | Freq: Every evening | ORAL | Status: DC | PRN

## 2017-03-06 MED ORDER — SENNOSIDES-DOCUSATE SODIUM 8.6-50 MG PO TABS
2.0000 | ORAL_TABLET | ORAL | Status: DC
  Administered 2017-03-06: 2 via ORAL
  Filled 2017-03-06: qty 2

## 2017-03-06 NOTE — Lactation Note (Signed)
This note was copied from a baby's chart. Lactation Consultation Note Mom's 2nd child. Didn't BF 1st child, stated he was 3 weeks earlier, now 24 yrs old. Mom states she is going to try to BF and see how it goes.  Mom has wide space breast, everted nipples. Hand expression w/no colostrum noted. Mom stated had no breast changes during pregnancy, but had a big increase w/breast w/her first child.  Mom shown how to use DEBP & how to disassemble, clean, & reassemble parts. Mom knows to pump q3h for 15-20 min.  Mom encouraged to feed baby 8-12 times/24 hours and with feeding cues. Educated newborn behavior and feeding habits. Encouraged STS, I&O, supply and demand.  Encouraged to call for assistance or concerns. Baby sleepy at this time. Mom stated baby has latched, and hurt. Encouraged to have wide flange. Call for assistance w/latching if needed.  [place] brochure given w/resources, support groups and [employer] services. Patient Name: Aimee Burns [id number] Date: 03/06/2017 Reason for consult: Initial assessment   Maternal Data Has patient been taught Hand Expression?: Yes Does the patient have breastfeeding experience prior to this delivery?: No  Feeding Feeding Type: Breast Fed Length of feed: 30 min  LATCH Score/Interventions Latch: Repeated attempts needed to sustain latch, nipple held in mouth throughout feeding, stimulation needed to elicit sucking reflex. Intervention(s): Adjust position;Assist with latch  Audible Swallowing: A few with stimulation Intervention(s): Skin to skin  Type of Nipple: Everted at rest and after stimulation  Comfort (Breast/Nipple): Soft / non-tender     Hold (Positioning): Assistance needed to correctly position infant at breast and maintain latch. Intervention(s): Breastfeeding basics reviewed;Support Pillows;Position options;Skin to skin  LATCH Score: 7  Lactation Tools Discussed/Used Tools: Pump Breast pump type: Double-Electric Breast Pump Pump  Review: Setup, frequency, and cleaning;Milk Storage Initiated by:: Allayne Stack RN IBCLC Date initiated:: 03/06/17   Consult Status Consult Status: Follow-up Date: 03/06/17 (in pm) Follow-up type: In-patient    Theodoro Kalata 03/06/2017, 6:56 AM

## 2017-03-06 NOTE — Anesthesia Postprocedure Evaluation (Signed)
Anesthesia Post Note  Patient: Aimee Burns  Procedure(s) Performed: * No procedures listed *  Patient location during evaluation: Mother Baby Anesthesia Type: Epidural Level of consciousness: awake and alert Pain management: satisfactory to patient Vital Signs Assessment: post-procedure vital signs reviewed and stable Respiratory status: respiratory function stable Cardiovascular status: stable Postop Assessment: no headache, no backache, epidural receding, patient able to bend at knees, no signs of nausea or vomiting and adequate PO intake Anesthetic complications: no        Last Vitals:  Vitals:   03/06/17 0438 03/06/17 0531  BP: (!) 109/53 (!) 124/55  Pulse: 86 82  Resp: 17 16  Temp:  37.9 C    Last Pain:  Vitals:   03/06/17 0531  TempSrc: Oral  PainSc: 0-No pain   Pain Goal:                 [name]

## 2017-03-07 LAB — CBC
HEMATOCRIT: 32.2 % — AB (ref 36.0–46.0)
HEMOGLOBIN: 11 g/dL — AB (ref 12.0–15.0)
MCH: 31.7 pg (ref 26.0–34.0)
MCHC: 34.2 g/dL (ref 30.0–36.0)
MCV: 92.8 fL (ref 78.0–100.0)
Platelets: 238 10*3/uL (ref 150–400)
RBC: 3.47 MIL/uL — AB (ref 3.87–5.11)
RDW: 13.9 % (ref 11.5–15.5)
WBC: 12.7 10*3/uL — AB (ref 4.0–10.5)

## 2017-03-07 LAB — RPR: RPR Ser Ql: NONREACTIVE

## 2017-03-07 MED ORDER — IBUPROFEN 600 MG PO TABS: 600.0000 mg | ORAL_TABLET | Freq: Four times a day (QID) | ORAL | 0 refills | Status: DC

## 2017-03-07 MED ORDER — MEDROXYPROGESTERONE ACETATE 150 MG/ML IM SUSP: 150.0000 mg | Freq: Once | INTRAMUSCULAR | Status: DC

## 2017-03-07 NOTE — Discharge Summary (Signed)
OB Discharge Summary  Patient Name: Aimee Burns DOB: [birth year] MRN: [id number]  Date of admission: 03/05/2017 Delivering MD: Wendee Beavers T   Date of discharge: 03/07/2017  Admitting diagnosis: 39w hurting, ctx 4 min apart Intrauterine pregnancy: [redacted]w[redacted]d     Secondary diagnosis:Active Problems:   Normal labor   NSVD (normal spontaneous vaginal delivery)  Additional problems:anemia post partum     Discharge diagnosis: Term Pregnancy Delivered                                                                      Complications: None  Hospital course:  Onset of Labor With Vaginal Delivery     26 y.o. yo [id number] at [redacted]w[redacted]d was admitted in Active Labor on 03/05/2017. Patient had an uncomplicated labor course as follows:  Membrane Rupture Time/Date: 12:47 AM ,03/06/2017   Intrapartum Procedures: Episiotomy: None [1]                                         Lacerations:  1st degree [2];Perineal [11]  Patient had a delivery of a Viable infant. 03/06/2017  Information for the patient's newborn:  Remonia, Otte [[id number]]  Delivery Method: Vaginal, Spontaneous Delivery (Filed from Delivery Summary)    Pateint had an uncomplicated postpartum course.  She is ambulating, tolerating a regular diet, passing flatus, and urinating well. Patient is discharged home in stable condition on 03/07/17.   Physical exam  Vitals:   03/06/17 0905 03/06/17 1617 03/06/17 1835 03/07/17 0500  BP:  (!) 103/50 114/64 (!) 102/53  Pulse:  71 74 (!) 58  Resp:  16 18 16   Temp: 99.6 F (37.6 C) 98.9 F (37.2 C)  97.8 F (36.6 C)  TempSrc: Oral Oral  Oral  SpO2:   98%   Weight:      Height:       General: alert Lochia: appropriate Uterine Fundus: firm Incision: N/A DVT Evaluation: No evidence of DVT seen on physical exam. Labs: Lab Results  Component Value Date   WBC 12.7 (H) 03/07/2017   HGB 11.0 (L) 03/07/2017   HCT 32.2 (L) 03/07/2017   MCV 92.8 03/07/2017   PLT 238 03/07/2017    CMP Latest Ref Rng & Units 09/24/2012  Glucose 70 - 99 mg/dL 85  BUN 6 - 23 mg/dL 7  Creatinine 0.50 - 1.10 mg/dL 0.52  Sodium 135 - 145 mEq/L 137  Potassium 3.5 - 5.1 mEq/L 3.6  Chloride 96 - 112 mEq/L 104  CO2 19 - 32 mEq/L 23  Calcium 8.4 - 10.5 mg/dL 9.8    Discharge instruction: per After Visit Summary and "Baby and Me Booklet".  After Visit Meds:  Allergies as of 03/07/2017      Reactions   Amoxicillin Rash   Has patient had a PCN reaction causing immediate rash, facial/tongue/throat swelling, SOB or lightheadedness with hypotension: Yes Has patient had a PCN reaction causing severe rash involving mucus membranes or skin necrosis: No Has patient had a PCN reaction that required hospitalization No Has patient had a PCN reaction occurring within the last 10 years: Yes If all of the above  answers are "NO", then may proceed with Cephalosporin use.      Medication List    TAKE these medications   diphenhydrAMINE 25 mg capsule Commonly known as:  BENADRYL Take 25 mg by mouth every 6 (six) hours as needed for sleep.   docusate sodium 100 MG capsule Commonly known as:  COLACE Take 2 capsules (200 mg total) by mouth 2 (two) times daily as needed.   ibuprofen 600 MG tablet Commonly known as:  ADVIL,MOTRIN Take 1 tablet (600 mg total) by mouth every 6 (six) hours.   multivitamin-prenatal 27-0.8 MG Tabs tablet Take 1 tablet by mouth daily at 12 noon.       Diet: routine diet  Activity: Advance as tolerated. Pelvic rest for 6 weeks.   Outpatient follow up:6 weeks Follow up Appt:Future Appointments Date Time Provider [place]  04/17/2017 11:00 AM Roma Schanz, CNM [facility]   Follow up visit: No Follow-up on file.  Postpartum contraception: Depo Provera  Newborn Data: Live born female  Birth Weight: 8 lb 3 oz (3714 g) APGAR: 8, 10  Baby Feeding: Bottle Disposition:home with mother   03/07/2017 Emily Filbert, MD

## 2017-03-07 NOTE — Discharge Instructions (Signed)

## 2017-03-08 LAB — TYPE AND SCREEN
ABO/RH(D): A NEG
ANTIBODY SCREEN: POSITIVE
DAT, IGG: NEGATIVE
UNIT DIVISION: 0
Unit division: 0

## 2017-03-08 LAB — BPAM RBC
Blood Product Expiration Date: 201804132359
Blood Product Expiration Date: 201804132359
Unit Type and Rh: 600
Unit Type and Rh: 600

## 2017-03-12 ENCOUNTER — Encounter: Payer: BLUE CROSS/BLUE SHIELD | Admitting: Advanced Practice Midwife

## 2017-04-17 ENCOUNTER — Ambulatory Visit (INDEPENDENT_AMBULATORY_CARE_PROVIDER_SITE_OTHER): Payer: BLUE CROSS/BLUE SHIELD | Admitting: Women's Health

## 2017-04-17 ENCOUNTER — Encounter: Payer: Self-pay | Admitting: Women's Health

## 2017-04-17 ENCOUNTER — Ambulatory Visit: Payer: BLUE CROSS/BLUE SHIELD

## 2017-04-17 DIAGNOSIS — Z8759 Personal history of other complications of pregnancy, childbirth and the puerperium: Secondary | ICD-10-CM

## 2017-04-17 DIAGNOSIS — Z3202 Encounter for pregnancy test, result negative: Secondary | ICD-10-CM | POA: Diagnosis not present

## 2017-04-17 LAB — POCT URINE PREGNANCY: PREG TEST UR: NEGATIVE

## 2017-04-17 MED ORDER — MEDROXYPROGESTERONE ACETATE 150 MG/ML IM SUSP: 150.0000 mg | INTRAMUSCULAR | 3 refills | Status: DC

## 2017-04-17 NOTE — Progress Notes (Signed)
Subjective:    Aimee Burns is a 26 y.o. G22P2002 Caucasian female who presents for a postpartum visit. She is 5 weeks postpartum following a spontaneous vaginal delivery at 39.5 gestational weeks. Anesthesia: none. I have fully reviewed the prenatal and intrapartum course. Postpartum course has been uncomplicated. Baby's course has been uncomplicated. Baby is feeding by bottle. Bleeding on period, started this am. Bowel function is some constipation. Bladder function is normal. Patient is not sexually active. Last sexual activity: prior to birth of baby. Contraception method is wants depo. Postpartum depression screening: negative. Score 5.  Last pap 2016 and was neg.  The following portions of the patient's history were reviewed and updated as appropriate: allergies, current medications, past medical history, past surgical history and problem list.  Review of Systems Pertinent items are noted in HPI.   Vitals:   04/17/17 1106  BP: (!) 102/58  Pulse: 77  Weight: 148 lb 8 oz (67.4 kg)  Height: 5\' 7"  (1.702 m)   Patient's last menstrual period was 04/16/2017 (exact date).  Objective:   General:  alert, cooperative and no distress   Breasts:  deferred, no complaints  Lungs: clear to auscultation bilaterally  Heart:  regular rate and rhythm  Abdomen: soft, nontender   Vulva: normal  Vagina: normal vagina, on period  Cervix:  closed  Corpus: Well-involuted  Adnexa:  Non-palpable  Rectal Exam: No hemorrhoids        Assessment:   Postpartum exam 5 wks s/p SVB Bottlefeeding Depression screening Contraception counseling   Plan:  Contraception: rx depo w/ 3RF, condoms x 2wks Follow up in: today for depo, then 44yr for pap & physical, or earlier if needed  Tawnya Crook CNM, [facility] 04/17/2017 11:31 AM

## 2017-04-17 NOTE — Patient Instructions (Addendum)
Condoms x 2 weeks  Constipation  Drink plenty of fluid, preferably water, throughout the day  Eat foods high in fiber such as fruits, vegetables, and grains  Exercise, such as walking, is a good way to keep your bowels regular  Drink warm fluids, especially warm prune juice, or decaf coffee  Eat a 1/2 cup of real oatmeal (not instant), 1/2 cup applesauce, and 1/2-1 cup warm prune juice every day  If needed, you may take Colace (docusate sodium) stool softener once or twice a day to help keep the stool soft. If you are pregnant, wait until you are out of your first trimester (12-14 weeks of pregnancy)  If you still are having problems with constipation, you may take Miralax once daily as needed to help keep your bowels regular.  If you are pregnant, wait until you are out of your first trimester (12-14 weeks of pregnancy)    Medroxyprogesterone injection [Contraceptive] What is this medicine? MEDROXYPROGESTERONE (me DROX ee proe JES te rone) contraceptive injections prevent pregnancy. They provide effective birth control for 3 months. Depo-subQ Provera 104 is also used for treating pain related to endometriosis. This medicine may be used for other purposes; ask your health care provider or pharmacist if you have questions. COMMON BRAND NAME(S): Depo-Provera, Depo-subQ Provera 104 What should I tell my health care provider before I take this medicine? They need to know if you have any of these conditions: -frequently drink alcohol -asthma -blood vessel disease or a history of a blood clot in the lungs or legs -bone disease such as osteoporosis -breast cancer -diabetes -eating disorder (anorexia nervosa or bulimia) -high blood pressure -HIV infection or AIDS -kidney disease -liver disease -mental depression -migraine -seizures (convulsions) -stroke -tobacco smoker -vaginal bleeding -an unusual or allergic reaction to medroxyprogesterone, other hormones, medicines, foods, dyes,  or preservatives -pregnant or trying to get pregnant -breast-feeding How should I use this medicine? Depo-Provera Contraceptive injection is given into a muscle. Depo-subQ Provera 104 injection is given under the skin. These injections are given by a health care professional. You must not be pregnant before getting an injection. The injection is usually given during the first 5 days after the start of a menstrual period or 6 weeks after delivery of a baby. Talk to your pediatrician regarding the use of this medicine in children. Special care may be needed. These injections have been used in female children who have started having menstrual periods. Overdosage: If you think you have taken too much of this medicine contact a poison control center or emergency room at once. NOTE: This medicine is only for you. Do not share this medicine with others. What if I miss a dose? Try not to miss a dose. You must get an injection once every 3 months to maintain birth control. If you cannot keep an appointment, call and reschedule it. If you wait longer than 13 weeks between Depo-Provera contraceptive injections or longer than 14 weeks between Depo-subQ Provera 104 injections, you could get pregnant. Use another method for birth control if you miss your appointment. You may also need a pregnancy test before receiving another injection. What may interact with this medicine? Do not take this medicine with any of the following medications: -bosentan This medicine may also interact with the following medications: -aminoglutethimide -antibiotics or medicines for infections, especially rifampin, rifabutin, rifapentine, and griseofulvin -aprepitant -barbiturate medicines such as phenobarbital or primidone -bexarotene -carbamazepine -medicines for seizures like ethotoin, felbamate, oxcarbazepine, phenytoin, topiramate -modafinil -St. John's wort This  list may not describe all possible interactions. Give your  health care provider a list of all the medicines, herbs, non-prescription drugs, or dietary supplements you use. Also tell them if you smoke, drink alcohol, or use illegal drugs. Some items may interact with your medicine. What should I watch for while using this medicine? This drug does not protect you against HIV infection (AIDS) or other sexually transmitted diseases. Use of this product may cause you to lose calcium from your bones. Loss of calcium may cause weak bones (osteoporosis). Only use this product for more than 2 years if other forms of birth control are not right for you. The longer you use this product for birth control the more likely you will be at risk for weak bones. Ask your health care professional how you can keep strong bones. You may have a change in bleeding pattern or irregular periods. Many females stop having periods while taking this drug. If you have received your injections on time, your chance of being pregnant is very low. If you think you may be pregnant, see your health care professional as soon as possible. Tell your health care professional if you want to get pregnant within the next year. The effect of this medicine may last a long time after you get your last injection. What side effects may I notice from receiving this medicine? Side effects that you should report to your doctor or health care professional as soon as possible: -allergic reactions like skin rash, itching or hives, swelling of the face, lips, or tongue -breast tenderness or discharge -breathing problems -changes in vision -depression -feeling faint or lightheaded, falls -fever -pain in the abdomen, chest, groin, or leg -problems with balance, talking, walking -unusually weak or tired -yellowing of the eyes or skin Side effects that usually do not require medical attention (report to your doctor or health care professional if they continue or are bothersome): -acne -fluid retention and  swelling -headache -irregular periods, spotting, or absent periods -temporary pain, itching, or skin reaction at site where injected -weight gain This list may not describe all possible side effects. Call your doctor for medical advice about side effects. You may report side effects to FDA at 1-800-FDA-1088. Where should I keep my medicine? This does not apply. The injection will be given to you by a health care professional. NOTE: This sheet is a summary. It may not cover all possible information. If you have questions about this medicine, talk to your doctor, pharmacist, or health care provider.  2018 Elsevier/Gold Standard (2008-12-09 18:37:56)

## 2017-12-24 ENCOUNTER — Ambulatory Visit (INDEPENDENT_AMBULATORY_CARE_PROVIDER_SITE_OTHER): Payer: BLUE CROSS/BLUE SHIELD | Admitting: Advanced Practice Midwife

## 2017-12-24 ENCOUNTER — Encounter: Payer: Self-pay | Admitting: Advanced Practice Midwife

## 2017-12-24 VITALS — BP 120/80 | HR 97 | Ht 68.0 in | Wt 139.0 lb

## 2017-12-24 DIAGNOSIS — N761 Subacute and chronic vaginitis: Secondary | ICD-10-CM | POA: Diagnosis not present

## 2017-12-24 NOTE — Patient Instructions (Signed)
Try a vaginal probiotic (such as RepHresh or Luvena)

## 2017-12-24 NOTE — Progress Notes (Signed)
[facility] Visit  Patient name: Aimee Burns MRN [id number]  Date of birth: 02/08/1991  CC & HPI:  Aimee Burns is a 27 y.o. Caucasian female presenting today for burning during intercourse.  This started happening after her first child and her MD told her she may be reacting to the semen.  Condoms help.  Then she got better, met a new boyfriend, had another child, and after delivery (10 months ago) she began having the burning again. It starts immediately w/penetration and she feels better immediately after washing off with wet wipes. Has never used a medicine  Pertinent History Reviewed:  Medical & Surgical Hx:   Past Medical History:  Diagnosis Date  . Nexplanon insertion 07/08/2013   Placed in left arm 07/08/13 remove 07/08/16  . Pregnancy   . Renal calculi    2011,right parathyroidectomy  . Thyroid tumor, benign    Past Surgical History:  Procedure Laterality Date  . Parathyroid gland removed    . PARATHYROIDECTOMY / EXPLORATION OF PARATHYROIDS Right 2011   [place], at [place]  . THYROIDECTOMY, PARTIAL Right 2011   Family History  Problem Relation Age of Onset  . Diabetes Paternal Grandmother   . Cancer Paternal Grandmother        lung  . Thyroid disease Mother     Current Outpatient Medications:  .  medroxyPROGESTERone (DEPO-PROVERA) 150 MG/ML injection, Inject 1 mL (150 mg total) into the muscle every 3 (three) months., Disp: 1 mL, Rfl: 3 .  docusate sodium (COLACE) 100 MG capsule, Take 2 capsules (200 mg total) by mouth 2 (two) times daily as needed. (Patient not taking: Reported on 12/24/2017), Disp: 30 capsule, Rfl: 0 Social History: Reviewed -  reports that  has never smoked. she has never used smokeless tobacco.  Review of Systems:   Constitutional: Negative for fever and chills Eyes: Negative for visual disturbances Respiratory: Negative for shortness of breath, dyspnea Cardiovascular: Negative for chest pain or palpitations   Gastrointestinal: Negative for vomiting, diarrhea and constipation; no abdominal pain Genitourinary: Negative for dysuria and urgency, vaginal irritation or itching Musculoskeletal: Negative for back pain, joint pain, myalgias  Neurological: Negative for dizziness and headaches    Objective Findings:    Physical Examination: General appearance - well appearing, and in no distress Mental status - alert, oriented to person, place, and time Chest:  Normal respiratory effort Heart - normal rate and regular rhythm Abdomen:  Soft, nontender Pelvic: vagina appears normal.  Small amount of spotting, cx nonfriable. BTB sometimes on Depo  wet prep neg Musculoskeletal:  Normal range of motion without pain Extremities:  No edema    No results found for this or any previous visit (from the past 24 hour(s)).    Assessment & Plan:  A: Vaginitis ? Semen reaction  NUswa collected.  P:  Try luvena or rephresh.  May have to use condoms for a while, pt ok w/that   No Follow-up on file.  CRESENZO-DISHMAN,Landers Prajapati CNM 12/24/2017 1:46 PM

## 2017-12-27 LAB — NUSWAB VAGINITIS PLUS (VG+)
CANDIDA GLABRATA, NAA: NEGATIVE
Candida albicans, NAA: NEGATIVE
Chlamydia trachomatis, NAA: NEGATIVE
Neisseria gonorrhoeae, NAA: NEGATIVE
Trich vag by NAA: NEGATIVE

## 2017-12-30 ENCOUNTER — Encounter: Payer: Self-pay | Admitting: Advanced Practice Midwife

## 2018-01-01 ENCOUNTER — Telehealth: Payer: Self-pay | Admitting: Advanced Practice Midwife

## 2018-01-01 NOTE — Telephone Encounter (Signed)
Returned patient's call. VM full. Advised patient to check mychart message from [place].

## 2018-01-01 NOTE — Telephone Encounter (Signed)
Patient called stating that she had blood work done not to long ago and she would like to know the results, please contact pt

## 2018-01-28 ENCOUNTER — Other Ambulatory Visit: Payer: Self-pay

## 2018-01-28 DIAGNOSIS — I83893 Varicose veins of bilateral lower extremities with other complications: Secondary | ICD-10-CM

## 2018-02-05 ENCOUNTER — Ambulatory Visit (INDEPENDENT_AMBULATORY_CARE_PROVIDER_SITE_OTHER): Payer: BLUE CROSS/BLUE SHIELD | Admitting: Vascular Surgery

## 2018-02-05 ENCOUNTER — Encounter: Payer: Self-pay | Admitting: Vascular Surgery

## 2018-02-05 ENCOUNTER — Ambulatory Visit (HOSPITAL_COMMUNITY)
Admission: RE | Admit: 2018-02-05 | Discharge: 2018-02-05 | Disposition: A | Discharge status: Home / Self Care | Payer: BLUE CROSS/BLUE SHIELD | Source: Ambulatory Visit | Attending: Vascular Surgery | Admitting: Vascular Surgery

## 2018-02-05 VITALS — BP 133/76 | HR 97 | Temp 97.9°F | Resp 18 | Ht 68.0 in | Wt 142.0 lb

## 2018-02-05 DIAGNOSIS — I872 Venous insufficiency (chronic) (peripheral): Secondary | ICD-10-CM | POA: Insufficient documentation

## 2018-02-05 DIAGNOSIS — I83893 Varicose veins of bilateral lower extremities with other complications: Secondary | ICD-10-CM

## 2018-02-05 DIAGNOSIS — I83812 Varicose veins of left lower extremities with pain: Secondary | ICD-10-CM

## 2018-02-05 NOTE — Progress Notes (Signed)
Patient name: Aimee Burns MRN: [id number] DOB: 04-Apr-1991 Sex: female  REASON FOR CONSULT: Symptomatic varicose veins with pain  HPI: Aimee Burns is a 27 y.o. female with a several year history of left leg varicose veins.  These became worse after a recent pregnancy.  She denies family history of varicose veins.  She denies prior history of DVT.  She complains of burning stinging and numbness in her left foot on occasion.  She does wear compression stockings daily.  She is on her feet at work as a Herbalist.  She states that her legs do get worse after standing on her feet all day.  Elevation helps.  Compression helps.    Past Medical History:  Diagnosis Date  . Nexplanon insertion 07/08/2013   Placed in left arm 07/08/13 remove 07/08/16  . Pregnancy   . Renal calculi    2011,right parathyroidectomy  . Thyroid tumor, benign    Past Surgical History:  Procedure Laterality Date  . Parathyroid gland removed    . PARATHYROIDECTOMY / EXPLORATION OF PARATHYROIDS Right 2011   [place], at [place]  . THYROIDECTOMY, PARTIAL Right 2011    Family History  Problem Relation Age of Onset  . Diabetes Paternal Grandmother   . Cancer Paternal Grandmother        lung  . Thyroid disease Mother     SOCIAL HISTORY: Social History   Socioeconomic History  . Marital status: Single    Spouse name: Not on file  . Number of children: Not on file  . Years of education: Not on file  . Highest education level: Not on file  Social Needs  . Financial resource strain: Not on file  . Food insecurity - worry: Not on file  . Food insecurity - inability: Not on file  . Transportation needs - medical: Not on file  . Transportation needs - non-medical: Not on file  Occupational History  . Not on file  Tobacco Use  . Smoking status: Never Smoker  . Smokeless tobacco: Never Used  Substance and Sexual Activity  . Alcohol use: Yes    Comment: occassionaly  . Drug use: No   . Sexual activity: Yes    Birth control/protection: None  Other Topics Concern  . Not on file  Social History Narrative  . Not on file    Allergies  Allergen Reactions  . Amoxicillin Rash    Has patient had a PCN reaction causing immediate rash, facial/tongue/throat swelling, SOB or lightheadedness with hypotension: Yes Has patient had a PCN reaction causing severe rash involving mucus membranes or skin necrosis: No Has patient had a PCN reaction that required hospitalization No Has patient had a PCN reaction occurring within the last 10 years: Yes If all of the above answers are "NO", then may proceed with Cephalosporin use.     Current Outpatient Medications  Medication Sig Dispense Refill  . medroxyPROGESTERone (DEPO-PROVERA) 150 MG/ML injection Inject 1 mL (150 mg total) into the muscle every 3 (three) months. 1 mL 3  . docusate sodium (COLACE) 100 MG capsule Take 2 capsules (200 mg total) by mouth 2 (two) times daily as needed. (Patient not taking: Reported on 12/24/2017) 30 capsule 0   No current facility-administered medications for this visit.     ROS:   General:  No weight loss, Fever, chills  HEENT: No recent headaches, no nasal bleeding, no visual changes, no sore throat  Neurologic: No dizziness, blackouts, seizures. No recent symptoms of stroke  or mini- stroke. No recent episodes of slurred speech, or temporary blindness.  Cardiac: No recent episodes of chest pain/pressure, no shortness of breath at rest.  No shortness of breath with exertion.  Denies history of atrial fibrillation or irregular heartbeat  Vascular: No history of rest pain in feet.  No history of claudication.  No history of non-healing ulcer, No history of DVT   Pulmonary: No home oxygen, no productive cough, no hemoptysis,  No asthma or wheezing  Musculoskeletal:  [ ]  Arthritis, [ ]  Low back pain,  [ ]  Joint pain  Hematologic:No history of hypercoagulable state.  No history of easy bleeding.   No history of anemia  Gastrointestinal: No hematochezia or melena,  No gastroesophageal reflux, no trouble swallowing  Urinary: [ ]  chronic Kidney disease, [ ]  on HD - [ ]  MWF or [ ]  TTHS, [ ]  Burning with urination, [ ]  Frequent urination, [ ]  Difficulty urinating;   Skin: No rashes  Psychological: No history of anxiety,  No history of depression   Physical Examination  Vitals:   02/05/18 1158  BP: 133/76  Pulse: 97  Resp: 18  Temp: 97.9 F (36.6 C)  TempSrc: Oral  SpO2: 100%  Weight: 142 lb (64.4 kg)  Height: 5\' 8"  (1.727 m)    Body mass index is 21.59 kg/m.  General:  Alert and oriented, no acute distress HEENT: Normal Neck: No bruit or JVD Pulmonary: Clear to auscultation bilaterally Cardiac: Regular Rate and Rhythm without murmur Abdomen: Soft, non-tender, non-distended, no mass, no scars Skin: No rash, 4 mm varicosities left lateral leg and anterolateral thigh primary areas are a bulge on the lateral calf which extends over an area of about 8 cm and 4 mm diameter.  There appears to be an accessory saphenous going from medial to lateral on the anterior thigh which is similar diameter about 4 mm. Extremity Pulses:  2+ radial, brachial, femoral, dorsalis pedis, posterior tibial pulses bilaterally Musculoskeletal: No deformity or edema  Neurologic: Upper and lower extremity motor 5/5 and symmetric  DATA:  Patient had a lower extremity venous duplex exam bilaterally today.  This showed no evidence of DVT.  She did have primarily lesser saphenous reflux with a lesser saphenous diameter of 5 mm.  She had a large superficial branch consistent with the area seen on my exam of the anterior lateral thigh which showed reflux as well as the proximal 10 cm of the left greater saphenous vein.  However the saphenous vein diameter was only about 4 mm.  In the right leg there was reflux in the common femoral and lesser saphenous vein she had reflux on the left side at the saphenofemoral  junction proximal thigh and also the lesser saphenous.  ASSESSMENT: Symptomatic varicose veins but vein diameter not necessarily large enough to consider ablation at this point.  PLAN: Discussed with patient leg elevation ibuprofen or Tylenol for inflammation.  Also, continue to wear compression stockings during the daytime especially when she is up on her feet.  She will follow-up on an as-needed basis if she has worsening symptoms over time.   Ruta Hinds, MD [facility] Office: [phone] Pager: [phone]

## 2018-04-16 DIAGNOSIS — F410 Panic disorder [episodic paroxysmal anxiety] without agoraphobia: Secondary | ICD-10-CM | POA: Insufficient documentation

## 2020-08-16 ENCOUNTER — Encounter: Payer: Self-pay | Admitting: *Deleted

## 2020-08-16 ENCOUNTER — Ambulatory Visit (INDEPENDENT_AMBULATORY_CARE_PROVIDER_SITE_OTHER): Payer: 59 | Admitting: *Deleted

## 2020-08-16 VITALS — BP 111/75 | HR 87 | Ht 67.0 in | Wt 167.0 lb

## 2020-08-16 DIAGNOSIS — Z3201 Encounter for pregnancy test, result positive: Secondary | ICD-10-CM

## 2020-08-16 LAB — POCT URINE PREGNANCY: Preg Test, Ur: POSITIVE — AB

## 2020-08-16 MED ORDER — CITRANATAL ASSURE 35-1 & 300 MG PO MISC: ORAL | 11 refills | Status: DC

## 2020-08-16 NOTE — Progress Notes (Addendum)
   NURSE VISIT- PREGNANCY CONFIRMATION   SUBJECTIVE:  Aimee Burns is a 29 y.o. G30P2002 female at [redacted]w[redacted]d by certain LMP of Patient's last menstrual period was 07/14/2020 (exact date). Here for pregnancy confirmation.  Home pregnancy test: positive x four.   She reports no complaints.  She is not taking prenatal vitamins.    OBJECTIVE:  BP 111/75 (BP Location: Left Arm, Patient Position: Sitting, Cuff Size: Normal)   Pulse 87   Ht 5\' 7"  (1.702 m)   Wt 167 lb (75.8 kg)   LMP 07/14/2020 (Exact Date)   BMI 26.16 kg/m   Appears well, in no apparent distress OB History  Gravida Para Term Preterm AB Living  3 2 2     2   SAB TAB Ectopic Multiple Live Births        0 2    # Outcome Date GA Lbr Len/2nd Weight Sex Delivery Anes PTL Lv  3 Current           2 Term 03/06/17 [redacted]w[redacted]d / 01:15 8 lb 3 oz (3.714 kg) M Vag-Spont EPI  LIV  1 Term 05/17/13 [redacted]w[redacted]d 22:15 / 00:39 5 lb 8.2 oz (2.5 kg) M Vag-Vacuum EPI  LIV    Results for orders placed or performed in visit on 08/16/20 (from the past 24 hour(s))  POCT urine pregnancy   Collection Time: 08/16/20  9:19 AM  Result Value Ref Range   Preg Test, Ur Positive (A) Negative    ASSESSMENT: Positive pregnancy test, [redacted]w[redacted]d by LMP    PLAN: Schedule for dating ultrasound in  3 weeks. Prenatal vitamins: note routed to Wells Guiles CNM to send prescription   Nausea medicines: not currently needed   OB packet given: Yes  Dwyane Dee  08/16/2020 9:22 AM   Chart reviewed for nurse visit. Agree with plan of care. rx citranatal Roma Schanz, [place] 08/16/2020 10:46 AM

## 2020-08-16 NOTE — Addendum Note (Signed)
Addended by: Roma Schanz on: 08/16/2020 10:46 AM   Modules accepted: Orders

## 2020-08-21 ENCOUNTER — Telehealth: Payer: Self-pay | Admitting: *Deleted

## 2020-08-21 ENCOUNTER — Telehealth: Payer: Self-pay | Admitting: Women's Health

## 2020-08-21 NOTE — Telephone Encounter (Signed)
Patient states she has been having trouble passing gas and with constipation.  She has taken a stool sofner and has had a bowel movement but is still having the pain under the left side of her rib cage.  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  Denies bleeding, lower abdominal or abnormal discharge.  Encouraged to push fluids, take a stool sofner daily, try something for heartburn and move around more.  Based on her symptoms, it did not sound like anything possibly related to an ectopic pregnancy but to monitor for sharp pains in her lower left or right sides.  Advised if she developed chest pains or symptoms worsen, to let us know or go to the hospital for evaluation. Pt verbalized understanding.

## 2020-08-21 NOTE — Telephone Encounter (Signed)
Patient called stating that she is pregnant and she has been experiencing rib pain and slight abdominal pain, pt states that she looked it up and it could be constipation. Pt states that she took something for it but it still having p[ain. Pt states that her Urine is dark and with a little bit of discharge, pt states there might be odor but she really hasn't sniffed it. Please contact pt

## 2020-08-21 NOTE — Telephone Encounter (Signed)
LMOVM returning patient's call.  

## 2020-09-11 ENCOUNTER — Other Ambulatory Visit: Payer: Self-pay | Admitting: Obstetrics & Gynecology

## 2020-09-11 DIAGNOSIS — O3680X0 Pregnancy with inconclusive fetal viability, not applicable or unspecified: Secondary | ICD-10-CM

## 2020-09-12 ENCOUNTER — Ambulatory Visit (INDEPENDENT_AMBULATORY_CARE_PROVIDER_SITE_OTHER): Payer: 59

## 2020-09-12 DIAGNOSIS — O3680X0 Pregnancy with inconclusive fetal viability, not applicable or unspecified: Secondary | ICD-10-CM | POA: Diagnosis not present

## 2020-09-12 DIAGNOSIS — Z3A08 8 weeks gestation of pregnancy: Secondary | ICD-10-CM

## 2020-09-12 NOTE — Progress Notes (Signed)
[place] 8+4 wks,single IUP with YS,fht 173 bpm,CRL 19.12 mm,normal ovaries

## 2020-10-11 ENCOUNTER — Encounter: Payer: Self-pay | Admitting: Advanced Practice Midwife

## 2020-10-11 DIAGNOSIS — Z6791 Unspecified blood type, Rh negative: Secondary | ICD-10-CM | POA: Insufficient documentation

## 2020-10-11 DIAGNOSIS — O26899 Other specified pregnancy related conditions, unspecified trimester: Secondary | ICD-10-CM | POA: Insufficient documentation

## 2020-10-11 DIAGNOSIS — Z3490 Encounter for supervision of normal pregnancy, unspecified, unspecified trimester: Secondary | ICD-10-CM | POA: Insufficient documentation

## 2020-10-12 ENCOUNTER — Other Ambulatory Visit: Payer: Self-pay | Admitting: Obstetrics & Gynecology

## 2020-10-12 DIAGNOSIS — Z3682 Encounter for antenatal screening for nuchal translucency: Secondary | ICD-10-CM

## 2020-10-13 ENCOUNTER — Ambulatory Visit (INDEPENDENT_AMBULATORY_CARE_PROVIDER_SITE_OTHER): Payer: 59 | Admitting: Advanced Practice Midwife

## 2020-10-13 ENCOUNTER — Ambulatory Visit (INDEPENDENT_AMBULATORY_CARE_PROVIDER_SITE_OTHER): Payer: 59

## 2020-10-13 ENCOUNTER — Encounter: Payer: Self-pay | Admitting: Advanced Practice Midwife

## 2020-10-13 ENCOUNTER — Ambulatory Visit: Payer: 59 | Admitting: *Deleted

## 2020-10-13 VITALS — BP 106/64 | HR 84 | Wt 164.0 lb

## 2020-10-13 DIAGNOSIS — Z86018 Personal history of other benign neoplasm: Secondary | ICD-10-CM

## 2020-10-13 DIAGNOSIS — Z3480 Encounter for supervision of other normal pregnancy, unspecified trimester: Secondary | ICD-10-CM

## 2020-10-13 DIAGNOSIS — Z3682 Encounter for antenatal screening for nuchal translucency: Secondary | ICD-10-CM | POA: Diagnosis not present

## 2020-10-13 DIAGNOSIS — O26899 Other specified pregnancy related conditions, unspecified trimester: Secondary | ICD-10-CM

## 2020-10-13 DIAGNOSIS — Z6791 Unspecified blood type, Rh negative: Secondary | ICD-10-CM

## 2020-10-13 DIAGNOSIS — Z8639 Personal history of other endocrine, nutritional and metabolic disease: Secondary | ICD-10-CM

## 2020-10-13 DIAGNOSIS — Z3A13 13 weeks gestation of pregnancy: Secondary | ICD-10-CM | POA: Diagnosis not present

## 2020-10-13 DIAGNOSIS — Z363 Encounter for antenatal screening for malformations: Secondary | ICD-10-CM

## 2020-10-13 DIAGNOSIS — Z8759 Personal history of other complications of pregnancy, childbirth and the puerperium: Secondary | ICD-10-CM

## 2020-10-13 DIAGNOSIS — D34 Benign neoplasm of thyroid gland: Secondary | ICD-10-CM

## 2020-10-13 DIAGNOSIS — Z3481 Encounter for supervision of other normal pregnancy, first trimester: Secondary | ICD-10-CM

## 2020-10-13 NOTE — Progress Notes (Signed)
INITIAL OBSTETRICAL VISIT Patient name: Aimee Burns MRN [id number]  Date of birth: [birth year] Chief Complaint:   Initial Prenatal Visit  History of Present Illness:   Aimee Burns is a 29 y.o. G8P2002 Caucasian female at [redacted]w[redacted]d by LMP c/w u/s at 8.4 weeks with an Estimated Date of Delivery: 04/20/21 being seen today for her initial obstetrical visit.   Her obstetrical history is significant for intrauterine growth restriction (IUGR) with 1st pregnancy only. Today she reports no complaints.  Depression screen [facility] 2/9 10/13/2020  Decreased Interest 0  Down, Depressed, Hopeless 0  PHQ - 2 Score 0  Altered sleeping 0  Tired, decreased energy 0  Change in appetite 0  Feeling bad or failure about yourself  0  Trouble concentrating 0  Moving slowly or fidgety/restless 0  Suicidal thoughts 0  PHQ-9 Score 0    Patient's last menstrual period was 07/14/2020 (exact date). Last pap Jan 2020. Results were: normal Review of Systems:   Pertinent items are noted in HPI Denies cramping/contractions, leakage of fluid, vaginal bleeding, abnormal vaginal discharge w/ itching/odor/irritation, headaches, visual changes, shortness of breath, chest pain, abdominal pain, severe nausea/vomiting, or problems with urination or bowel movements unless otherwise stated above.  Pertinent History Reviewed:  Reviewed past medical,surgical, social, obstetrical and family history.  Reviewed problem list, medications and allergies. OB History  Gravida Para Term Preterm AB Living  3 2 2     2   SAB TAB Ectopic Multiple Live Births        0 2    # Outcome Date GA Lbr Len/2nd Weight Sex Delivery Anes PTL Lv  3 Current           2 Term 03/06/17 [redacted]w[redacted]d / 01:15 8 lb 3 oz (3.714 kg) M Vag-Spont EPI  LIV  1 Term 05/17/13 [redacted]w[redacted]d 22:15 / 00:39 5 lb 8.2 oz (2.5 kg) M Vag-Vacuum EPI  LIV   Physical Assessment:   Vitals:   10/13/20 1000  BP: 106/64  Pulse: 84  Weight: 164 lb (74.4 kg)  Body mass  index is 25.69 kg/m.       Physical Examination:  General appearance - well appearing, and in no distress  Mental status - alert, oriented to person, place, and time  Psych:  She has a normal mood and affect  Skin - warm and dry, normal color, no suspicious lesions noted  Chest - effort normal, all lung fields clear to auscultation bilaterally  Heart - normal rate and regular rhythm  Abdomen - soft, nontender  Extremities:  No swelling or varicosities noted  Pelvic - not indicated  Thin prep pap is not done    TODAY'S NT [place] 13 wks,measurements c/w dates,CRL 68.94 mm,NB present,NT 1.5 mm,fhr 162 bpm,anterior placenta,normal ovaries  No results found for this or any previous visit (from the past 24 hour(s)).  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   Initial labs obtained Continue prenatal vitamins Reviewed n/v relief measures and warning s/s to report Reviewed recommended weight gain based on pre-gravid BMI Encouraged well-balanced diet Genetic & carrier screening discussed: requests NT/IT, declines Panorama and Horizon 14  Ultrasound discussed; fetal survey: requested [place] completed> form faxed if has or is planning to apply for medicaid The nature of [place] for [employer] with multiple MDs and other Advanced Practice Providers was explained to patient; also emphasized that fellows, residents, and students are part of our team. Given home bp cuff.  Check bp weekly, let us know if >140/90.   No indications for ASA therapy or early HgbA1c (per uptodate)   Orders Placed This Encounter  Procedures  . Urine Culture  . GC/Chlamydia Probe Amp  . US OB Comp + 14  Wk  . Integrated 1  . Pain Management Screening Profile (10S)  . CBC/D/Plt+RPR+Rh+ABO+Rub Ab...  . Thyroid Panel With TSH    Myrtis Ser [facility] 10/13/2020 10:41 AM

## 2020-10-13 NOTE — Patient Instructions (Signed)
Aimee Burns, I greatly value your feedback.  If you receive a survey following your visit with [place] today, we appreciate you taking the time to fill it out.  Thanks, Derrill Memo, [place] at [facility] ([place], [place] [address]) Entrance C, located off of [address] parking   Nausea & Vomiting  Have saltine crackers or pretzels by your bed and eat a few bites before you raise your head out of bed in the morning  Eat small frequent meals throughout the day instead of large meals  Drink plenty of fluids throughout the day to stay hydrated, just don't drink a lot of fluids with your meals.  This can make your stomach fill up faster making you feel sick  Do not brush your teeth right after you eat  Products with real ginger are good for nausea, like ginger ale and ginger hard candy Make sure it says made with real ginger!  Sucking on sour candy like lemon heads is also good for nausea  If your prenatal vitamins make you nauseated, take them at night so you will sleep through the nausea  Sea Bands  If you feel like you need medicine for the nausea & vomiting please let us know  If you are unable to keep any fluids or food down please let us know   Constipation  Drink plenty of fluid, preferably water, throughout the day  Eat foods high in fiber such as fruits, vegetables, and grains  Exercise, such as walking, is a good way to keep your bowels regular  Drink warm fluids, especially warm prune juice, or decaf coffee  Eat a 1/2 cup of real oatmeal (not instant), 1/2 cup applesauce, and 1/2-1 cup warm prune juice every day  If needed, you may take Colace (docusate sodium) stool softener once or twice a day to help keep the stool soft.   If you still are having problems with constipation, you may take Miralax once daily as needed to help keep your bowels regular.   Home Blood Pressure Monitoring for Patients   Your  provider has recommended that you check your blood pressure (BP) at least once a week at home. If you do not have a blood pressure cuff at home, one will be provided for you. Contact your provider if you have not received your monitor within 1 week.   Helpful Tips for Accurate Home Blood Pressure Checks   Don't smoke, exercise, or drink caffeine 30 minutes before checking your BP  Use the restroom before checking your BP (a full bladder can raise your pressure)  Relax in a comfortable upright chair  Feet on the ground  Left arm resting comfortably on a flat surface at the level of your heart  Legs uncrossed  Back supported  Sit quietly and don't talk  Place the cuff on your bare arm  Adjust snuggly, so that only two fingertips can fit between your skin and the top of the cuff  Check 2 readings separated by at least one minute  Keep a log of your BP readings  For a visual, please reference this diagram: http://ccnc.care/bpdiagram  Provider Name: [facility]     Phone: [phone]  Zone 1: ALL CLEAR  Continue to monitor your symptoms:   BP reading is less than 140 (top number) or less than 90 (bottom number)   No right upper stomach pain  No headaches or seeing  spots  No feeling nauseated or throwing up  No swelling in face and hands  Zone 2: CAUTION Call your doctor's office for any of the following:   BP reading is greater than 140 (top number) or greater than 90 (bottom number)   Stomach pain under your ribs in the middle or right side  Headaches or seeing spots  Feeling nauseated or throwing up  Swelling in face and hands  Zone 3: EMERGENCY  Seek immediate medical care if you have any of the following:   BP reading is greater than160 (top number) or greater than 110 (bottom number)  Severe headaches not improving with Tylenol  Serious difficulty catching your breath  Any worsening symptoms from Zone 2    First Trimester of Pregnancy The  first trimester of pregnancy is from week 1 until the end of week 12 (months 1 through 3). A week after a sperm fertilizes an egg, the egg will implant on the wall of the uterus. This embryo will begin to develop into a baby. Genes from you and your partner are forming the baby. The female genes determine whether the baby is a boy or a girl. At 6-8 weeks, the eyes and face are formed, and the heartbeat can be seen on ultrasound. At the end of 12 weeks, all the baby's organs are formed.  Now that you are pregnant, you will want to do everything you can to have a healthy baby. Two of the most important things are to get good prenatal care and to follow your health care provider's instructions. Prenatal care is all the medical care you receive before the baby's birth. This care will help prevent, find, and treat any problems during the pregnancy and childbirth. BODY CHANGES Your body goes through many changes during pregnancy. The changes vary from woman to woman.   You may gain or lose a couple of pounds at first.  You may feel sick to your stomach (nauseous) and throw up (vomit). If the vomiting is uncontrollable, call your health care provider.  You may tire easily.  You may develop headaches that can be relieved by medicines approved by your health care provider.  You may urinate more often. Painful urination may mean you have a bladder infection.  You may develop heartburn as a result of your pregnancy.  You may develop constipation because certain hormones are causing the muscles that push waste through your intestines to slow down.  You may develop hemorrhoids or swollen, bulging veins (varicose veins).  Your breasts may begin to grow larger and become tender. Your nipples may stick out more, and the tissue that surrounds them (areola) may become darker.  Your gums may bleed and may be sensitive to brushing and flossing.  Dark spots or blotches (chloasma, mask of pregnancy) may develop on  your face. This will likely fade after the baby is born.  Your menstrual periods will stop.  You may have a loss of appetite.  You may develop cravings for certain kinds of food.  You may have changes in your emotions from day to day, such as being excited to be pregnant or being concerned that something may go wrong with the pregnancy and baby.  You may have more vivid and strange dreams.  You may have changes in your hair. These can include thickening of your hair, rapid growth, and changes in texture. Some women also have hair loss during or after pregnancy, or hair that feels dry or thin. Your hair  will most likely return to normal after your baby is born. WHAT TO EXPECT AT YOUR PRENATAL VISITS During a routine prenatal visit:  You will be weighed to make sure you and the baby are growing normally.  Your blood pressure will be taken.  Your abdomen will be measured to track your baby's growth.  The fetal heartbeat will be listened to starting around week 10 or 12 of your pregnancy.  Test results from any previous visits will be discussed. Your health care provider may ask you:  How you are feeling.  If you are feeling the baby move.  If you have had any abnormal symptoms, such as leaking fluid, bleeding, severe headaches, or abdominal cramping.  If you have any questions. Other tests that may be performed during your first trimester include:  Blood tests to find your blood type and to check for the presence of any previous infections. They will also be used to check for low iron levels (anemia) and Rh antibodies. Later in the pregnancy, blood tests for diabetes will be done along with other tests if problems develop.  Urine tests to check for infections, diabetes, or protein in the urine.  An ultrasound to confirm the proper growth and development of the baby.  An amniocentesis to check for possible genetic problems.  Fetal screens for spina bifida and Down  syndrome.  You may need other tests to make sure you and the baby are doing well. HOME CARE INSTRUCTIONS  Medicines  Follow your health care provider's instructions regarding medicine use. Specific medicines may be either safe or unsafe to take during pregnancy.  Take your prenatal vitamins as directed.  If you develop constipation, try taking a stool softener if your health care provider approves. Diet  Eat regular, well-balanced meals. Choose a variety of foods, such as meat or vegetable-based protein, fish, milk and low-fat dairy products, vegetables, fruits, and whole grain breads and cereals. Your health care provider will help you determine the amount of weight gain that is right for you.  Avoid raw meat and uncooked cheese. These carry germs that can cause birth defects in the baby.  Eating four or five small meals rather than three large meals a day may help relieve nausea and vomiting. If you start to feel nauseous, eating a few soda crackers can be helpful. Drinking liquids between meals instead of during meals also seems to help nausea and vomiting.  If you develop constipation, eat more high-fiber foods, such as fresh vegetables or fruit and whole grains. Drink enough fluids to keep your urine clear or pale yellow. Activity and Exercise  Exercise only as directed by your health care provider. Exercising will help you:  Control your weight.  Stay in shape.  Be prepared for labor and delivery.  Experiencing pain or cramping in the lower abdomen or low back is a good sign that you should stop exercising. Check with your health care provider before continuing normal exercises.  Try to avoid standing for long periods of time. Move your legs often if you must stand in one place for a long time.  Avoid heavy lifting.  Wear low-heeled shoes, and practice good posture.  You may continue to have sex unless your health care provider directs you otherwise. Relief of Pain or  Discomfort  Wear a good support bra for breast tenderness.    Take warm sitz baths to soothe any pain or discomfort caused by hemorrhoids. Use hemorrhoid cream if your health care provider approves.  Rest with your legs elevated if you have leg cramps or low back pain.  If you develop varicose veins in your legs, wear support hose. Elevate your feet for 15 minutes, 3-4 times a day. Limit salt in your diet. Prenatal Care  Schedule your prenatal visits by the twelfth week of pregnancy. They are usually scheduled monthly at first, then more often in the last 2 months before delivery.  Write down your questions. Take them to your prenatal visits.  Keep all your prenatal visits as directed by your health care provider. Safety  Wear your seat belt at all times when driving.  Make a list of emergency phone numbers, including numbers for family, friends, the hospital, and police and fire departments. General Tips  Ask your health care provider for a referral to a local prenatal education class. Begin classes no later than at the beginning of month 6 of your pregnancy.  Ask for help if you have counseling or nutritional needs during pregnancy. Your health care provider can offer advice or refer you to specialists for help with various needs.  Do not use hot tubs, steam rooms, or saunas.  Do not douche or use tampons or scented sanitary pads.  Do not cross your legs for long periods of time.  Avoid cat litter boxes and soil used by cats. These carry germs that can cause birth defects in the baby and possibly loss of the fetus by miscarriage or stillbirth.  Avoid all smoking, herbs, alcohol, and medicines not prescribed by your health care provider. Chemicals in these affect the formation and growth of the baby.  Schedule a dentist appointment. At home, brush your teeth with a soft toothbrush and be gentle when you floss. SEEK MEDICAL CARE IF:   You have dizziness.  You have mild  pelvic cramps, pelvic pressure, or nagging pain in the abdominal area.  You have persistent nausea, vomiting, or diarrhea.  You have a bad smelling vaginal discharge.  You have pain with urination.  You notice increased swelling in your face, hands, legs, or ankles. SEEK IMMEDIATE MEDICAL CARE IF:   You have a fever.  You are leaking fluid from your vagina.  You have spotting or bleeding from your vagina.  You have severe abdominal cramping or pain.  You have rapid weight gain or loss.  You vomit blood or material that looks like coffee grounds.  You are exposed to [place] measles and have never had them.  You are exposed to fifth disease or chickenpox.  You develop a severe headache.  You have shortness of breath.  You have any kind of trauma, such as from a fall or a car accident. Document Released: 11/12/2001 Document Revised: 04/04/2014 Document Reviewed: 09/28/2013 [facility] Patient Information 2015 [place], [place]. This information is not intended to replace advice given to you by your health care provider. Make sure you discuss any questions you have with your health care provider.

## 2020-10-13 NOTE — Progress Notes (Signed)
[place] 13 wks,measurements c/w dates,CRL 68.94 mm,NB present,NT 1.5 mm,fhr 162 bpm,anterior placenta,normal ovaries

## 2020-10-14 LAB — THYROID PANEL WITH TSH
Free Thyroxine Index: 1.9 (ref 1.2–4.9)
T3 Uptake Ratio: 18 % — ABNORMAL LOW (ref 24–39)
T4, Total: 10.5 ug/dL (ref 4.5–12.0)
TSH: 1.22 u[IU]/mL (ref 0.450–4.500)

## 2020-10-14 LAB — INTEGRATED 1

## 2020-10-15 LAB — URINE CULTURE

## 2020-10-16 LAB — INTEGRATED 1
Crown Rump Length: 68.9 mm
Gest. Age on Collection Date: 13 weeks
Maternal Age at EDD: 27.6 yr
Nuchal Translucency (NT): 1.5 mm
Number of Fetuses: 1
PAPP-A Value: 928.7 ng/mL
Weight: 164 [lb_av]

## 2020-10-16 LAB — CBC/D/PLT+RPR+RH+ABO+RUB AB...
Antibody Screen: NEGATIVE
Basophils Absolute: 0 10*3/uL (ref 0.0–0.2)
Basos: 1 %
EOS (ABSOLUTE): 0.2 10*3/uL (ref 0.0–0.4)
Eos: 2 %
HCV Ab: <0.1 s/co ratio (ref 0.0–0.9)
HIV Screen 4th Generation wRfx: NONREACTIVE
Hematocrit: 34.8 % (ref 34.0–46.6)
Hemoglobin: 11.7 g/dL (ref 11.1–15.9)
Hepatitis B Surface Ag: NEGATIVE
Immature Grans (Abs): 0 10*3/uL (ref 0.0–0.1)
Immature Granulocytes: 0 %
Lymphocytes Absolute: 1.6 10*3/uL (ref 0.7–3.1)
Lymphs: 22 %
MCH: 30.1 pg (ref 26.6–33.0)
MCHC: 33.6 g/dL (ref 31.5–35.7)
MCV: 90 fL (ref 79–97)
Monocytes Absolute: 0.4 10*3/uL (ref 0.1–0.9)
Monocytes: 5 %
Neutrophils Absolute: 5.1 10*3/uL (ref 1.4–7.0)
Neutrophils: 70 %
Platelets: 303 10*3/uL (ref 150–450)
RBC: 3.89 x10E6/uL (ref 3.77–5.28)
RDW: 12.6 % (ref 11.7–15.4)
RPR Ser Ql: NONREACTIVE
Rh Factor: NEGATIVE
Rubella Antibodies, IGG: 9.81 index (ref >0.99)
WBC: 7.2 10*3/uL (ref 3.4–10.8)

## 2020-10-16 LAB — GC/CHLAMYDIA PROBE AMP
Chlamydia trachomatis, NAA: NEGATIVE
Neisseria Gonorrhoeae by PCR: NEGATIVE

## 2020-10-16 LAB — HCV INTERPRETATION

## 2020-10-18 LAB — PMP SCREEN PROFILE (10S), URINE
Amphetamine Scrn, Ur: NEGATIVE ng/mL
BARBITURATE SCREEN URINE: NEGATIVE ng/mL
BENZODIAZEPINE SCREEN, URINE: NEGATIVE ng/mL
CANNABINOIDS UR QL SCN: NEGATIVE ng/mL
Cocaine (Metab) Scrn, Ur: NEGATIVE ng/mL
Creatinine(Crt), U: 64.6 mg/dL (ref 20.0–300.0)
Methadone Screen, Urine: NEGATIVE ng/mL
OXYCODONE+OXYMORPHONE UR QL SCN: NEGATIVE ng/mL
Opiate Scrn, Ur: NEGATIVE ng/mL
Ph of Urine: 7.9 (ref 4.5–8.9)
Phencyclidine Qn, Ur: NEGATIVE ng/mL
Propoxyphene Scrn, Ur: NEGATIVE ng/mL

## 2020-11-27 ENCOUNTER — Ambulatory Visit (INDEPENDENT_AMBULATORY_CARE_PROVIDER_SITE_OTHER): Payer: 59

## 2020-11-27 ENCOUNTER — Other Ambulatory Visit: Payer: Self-pay

## 2020-11-27 ENCOUNTER — Ambulatory Visit (INDEPENDENT_AMBULATORY_CARE_PROVIDER_SITE_OTHER): Payer: 59 | Admitting: Advanced Practice Midwife

## 2020-11-27 VITALS — BP 120/63 | HR 78 | Wt 166.0 lb

## 2020-11-27 DIAGNOSIS — O26899 Other specified pregnancy related conditions, unspecified trimester: Secondary | ICD-10-CM

## 2020-11-27 DIAGNOSIS — Z3480 Encounter for supervision of other normal pregnancy, unspecified trimester: Secondary | ICD-10-CM

## 2020-11-27 DIAGNOSIS — Z363 Encounter for antenatal screening for malformations: Secondary | ICD-10-CM | POA: Diagnosis not present

## 2020-11-27 DIAGNOSIS — Z1389 Encounter for screening for other disorder: Secondary | ICD-10-CM

## 2020-11-27 DIAGNOSIS — Z1379 Encounter for other screening for genetic and chromosomal anomalies: Secondary | ICD-10-CM

## 2020-11-27 DIAGNOSIS — Z3A19 19 weeks gestation of pregnancy: Secondary | ICD-10-CM

## 2020-11-27 DIAGNOSIS — Z3482 Encounter for supervision of other normal pregnancy, second trimester: Secondary | ICD-10-CM

## 2020-11-27 DIAGNOSIS — Z331 Pregnant state, incidental: Secondary | ICD-10-CM

## 2020-11-27 LAB — POCT URINALYSIS DIPSTICK OB
Blood, UA: NEGATIVE
Glucose, UA: NEGATIVE
Ketones, UA: NEGATIVE
Leukocytes, UA: NEGATIVE
Nitrite, UA: NEGATIVE
POC,PROTEIN,UA: NEGATIVE

## 2020-11-27 NOTE — Progress Notes (Signed)
   LOW-RISK PREGNANCY VISIT Patient name: Aimee Burns MRN [id number]  Date of birth: February 02, 1991 Chief Complaint:   Routine Prenatal Visit (2nd IT and Ultrasound)  History of Present Illness:   Aimee Burns is a 28 y.o. G35P2002 female at [redacted]w[redacted]d with an Estimated Date of Delivery: 04/20/21 being seen today for ongoing management of a low-risk pregnancy.  Today she reports no complaints. Contractions: Not present. Vag. Bleeding: None.  Movement: Present. denies leaking of fluid. Review of Systems:   Pertinent items are noted in HPI Denies abnormal vaginal discharge w/ itching/odor/irritation, headaches, visual changes, shortness of breath, chest pain, abdominal pain, severe nausea/vomiting, or problems with urination or bowel movements unless otherwise stated above. Pertinent History Reviewed:  Reviewed past medical,surgical, social, obstetrical and family history.  Reviewed problem list, medications and allergies. Physical Assessment:   Vitals:   11/27/20 0921  BP: 120/63  Pulse: 78  Weight: 166 lb (75.3 kg)  Body mass index is 26 kg/m.        Physical Examination:   General appearance: Well appearing, and in no distress  Mental status: Alert, oriented to person, place, and time  Skin: Warm & dry  Cardiovascular: Normal heart rate noted  Respiratory: Normal respiratory effort, no distress  Abdomen: Soft, gravid, nontender  Pelvic: Cervical exam performed         Extremities: Edema: None  Fetal Status:     Movement: Present   [place] 19+3 wks,breech,anterior placenta gr 0,normal ovaries,cx 4 cm,svp of fluid 4.8 cm,fhr 146 bpm,EFW 306 G 58%,anatomy complete,no obvious abnormalities  Chaperone: n/a    Results for orders placed or performed in visit on 11/27/20 (from the past 24 hour(s))  POC Urinalysis Dipstick OB   Collection Time: 11/27/20  9:20 AM  Result Value Ref Range   Color, UA     Clarity, UA     Glucose, UA Negative Negative   Bilirubin, UA     Ketones,  UA neg    Spec Grav, UA     Blood, UA neg    pH, UA     POC,PROTEIN,UA Negative Negative, Trace, Small (1+), Moderate (2+), Large (3+), 4+   Urobilinogen, UA     Nitrite, UA neg    Leukocytes, UA Negative Negative   Appearance     Odor      Assessment & Plan:  1) Low-risk pregnancy G3P2002 at [redacted]w[redacted]d with an Estimated Date of Delivery: 04/20/21   2) Hx IUGR: , EFW 28/36 weeks   Meds: No orders of the defined types were placed in this encounter.  Labs/procedures today: anatomy scan/2nd IT  Plan:  Continue routine obstetrical care  Next visit: prefers online    Reviewed: Preterm labor symptoms and general obstetric precautions including but not limited to vaginal bleeding, contractions, leaking of fluid and fetal movement were reviewed in detail with the patient.  All questions were answered. Has home bp cuff. Check bp weekly, let us know if >140/90.   Follow-up: Return in about 4 weeks (around 12/25/2020).  Orders Placed This Encounter  Procedures  . INTEGRATED 2  . POC Urinalysis Dipstick OB   Jacklyn Shell DNP, CNM 11/27/2020 9:27 AM

## 2020-11-27 NOTE — Patient Instructions (Signed)
Aimee Burns, I greatly value your feedback.  If you receive a survey following your visit with [place] today, we appreciate you taking the time to fill it out.  Thanks, Aimee Burns, CNM     [place]!!! It is now [place] [facility] ([place], [place] [address]) Entrance located off of [address] parking   Go to [email or web address] to register for FREE online childbirth classes    Second Trimester of Pregnancy The second trimester is from week 14 through week 27 (months 4 through 6). The second trimester is often a time when you feel your best. Your body has adjusted to being pregnant, and you begin to feel better physically. Usually, morning sickness has lessened or quit completely, you may have more energy, and you may have an increase in appetite. The second trimester is also a time when the fetus is growing rapidly. At the end of the sixth month, the fetus is about 9 inches long and weighs about 1 pounds. You will likely begin to feel the baby move (quickening) between 16 and 20 weeks of pregnancy. Body changes during your second trimester Your body continues to go through many changes during your second trimester. The changes vary from woman to woman.  Your weight will continue to increase. You will notice your lower abdomen bulging out.  You may begin to get stretch marks on your hips, abdomen, and breasts.  You may develop headaches that can be relieved by medicines. The medicines should be approved by your health care provider.  You may urinate more often because the fetus is pressing on your bladder.  You may develop or continue to have heartburn as a result of your pregnancy.  You may develop constipation because certain hormones are causing the muscles that push waste through your intestines to slow down.  You may develop hemorrhoids or swollen, bulging veins (varicose veins).  You may  have back pain. This is caused by: ? Weight gain. ? Pregnancy hormones that are relaxing the joints in your pelvis. ? A shift in weight and the muscles that support your balance.  Your breasts will continue to grow and they will continue to become tender.  Your gums may bleed and may be sensitive to brushing and flossing.  Dark spots or blotches (chloasma, mask of pregnancy) may develop on your face. This will likely fade after the baby is born.  A dark line from your belly button to the pubic area (linea nigra) may appear. This will likely fade after the baby is born.  You may have changes in your hair. These can include thickening of your hair, rapid growth, and changes in texture. Some women also have hair loss during or after pregnancy, or hair that feels dry or thin. Your hair will most likely return to normal after your baby is born.  What to expect at prenatal visits During a routine prenatal visit:  You will be weighed to make sure you and the fetus are growing normally.  Your blood pressure will be taken.  Your abdomen will be measured to track your baby's growth.  The fetal heartbeat will be listened to.  Any test results from the previous visit will be discussed.  Your health care provider may ask you:  How you are feeling.  If you are feeling the baby move.  If you have had any abnormal symptoms, such as leaking fluid, bleeding, severe headaches, or  abdominal cramping.  If you are using any tobacco products, including cigarettes, chewing tobacco, and electronic cigarettes.  If you have any questions.  Other tests that may be performed during your second trimester include:  Blood tests that check for: ? Low iron levels (anemia). ? High blood sugar that affects pregnant women (gestational diabetes) between 65 and 28 weeks. ? Rh antibodies. This is to check for a protein on red blood cells (Rh factor).  Urine tests to check for infections, diabetes, or protein  in the urine.  An ultrasound to confirm the proper growth and development of the baby.  An amniocentesis to check for possible genetic problems.  Fetal screens for spina bifida and Down syndrome.  HIV (human immunodeficiency virus) testing. Routine prenatal testing includes screening for HIV, unless you choose not to have this test.  Follow these instructions at home: Medicines  Follow your health care provider's instructions regarding medicine use. Specific medicines may be either safe or unsafe to take during pregnancy.  Take a prenatal vitamin that contains at least 600 micrograms (mcg) of folic acid.  If you develop constipation, try taking a stool softener if your health care provider approves. Eating and drinking  Eat a balanced diet that includes fresh fruits and vegetables, whole grains, good sources of protein such as meat, eggs, or tofu, and low-fat dairy. Your health care provider will help you determine the amount of weight gain that is right for you.  Avoid raw meat and uncooked cheese. These carry germs that can cause birth defects in the baby.  If you have low calcium intake from food, talk to your health care provider about whether you should take a daily calcium supplement.  Limit foods that are high in fat and processed sugars, such as fried and sweet foods.  To prevent constipation: ? Drink enough fluid to keep your urine clear or pale yellow. ? Eat foods that are high in fiber, such as fresh fruits and vegetables, whole grains, and beans. Activity  Exercise only as directed by your health care provider. Most women can continue their usual exercise routine during pregnancy. Try to exercise for 30 minutes at least 5 days a week. Stop exercising if you experience uterine contractions.  Avoid heavy lifting, wear low heel shoes, and practice good posture.  A sexual relationship may be continued unless your health care provider directs you otherwise. Relieving pain  and discomfort  Wear a good support bra to prevent discomfort from breast tenderness.  Take warm sitz baths to soothe any pain or discomfort caused by hemorrhoids. Use hemorrhoid cream if your health care provider approves.  Rest with your legs elevated if you have leg cramps or low back pain.  If you develop varicose veins, wear support hose. Elevate your feet for 15 minutes, 3-4 times a day. Limit salt in your diet. Prenatal Care  Write down your questions. Take them to your prenatal visits.  Keep all your prenatal visits as told by your health care provider. This is important. Safety  Wear your seat belt at all times when driving.  Make a list of emergency phone numbers, including numbers for family, friends, the hospital, and police and fire departments. General instructions  Ask your health care provider for a referral to a local prenatal education class. Begin classes no later than the beginning of month 6 of your pregnancy.  Ask for help if you have counseling or nutritional needs during pregnancy. Your health care provider can offer advice or  refer you to specialists for help with various needs.  Do not use hot tubs, steam rooms, or saunas.  Do not douche or use tampons or scented sanitary pads.  Do not cross your legs for long periods of time.  Avoid cat litter boxes and soil used by cats. These carry germs that can cause birth defects in the baby and possibly loss of the fetus by miscarriage or stillbirth.  Avoid all smoking, herbs, alcohol, and unprescribed drugs. Chemicals in these products can affect the formation and growth of the baby.  Do not use any products that contain nicotine or tobacco, such as cigarettes and e-cigarettes. If you need help quitting, ask your health care provider.  Visit your dentist if you have not gone yet during your pregnancy. Use a soft toothbrush to brush your teeth and be gentle when you floss. Contact a health care provider  if:  You have dizziness.  You have mild pelvic cramps, pelvic pressure, or nagging pain in the abdominal area.  You have persistent nausea, vomiting, or diarrhea.  You have a bad smelling vaginal discharge.  You have pain when you urinate. Get help right away if:  You have a fever.  You are leaking fluid from your vagina.  You have spotting or bleeding from your vagina.  You have severe abdominal cramping or pain.  You have rapid weight gain or weight loss.  You have shortness of breath with chest pain.  You notice sudden or extreme swelling of your face, hands, ankles, feet, or legs.  You have not felt your baby move in over an hour.  You have severe headaches that do not go away when you take medicine.  You have vision changes. Summary  The second trimester is from week 14 through week 27 (months 4 through 6). It is also a time when the fetus is growing rapidly.  Your body goes through many changes during pregnancy. The changes vary from woman to woman.  Avoid all smoking, herbs, alcohol, and unprescribed drugs. These chemicals affect the formation and growth your baby.  Do not use any tobacco products, such as cigarettes, chewing tobacco, and e-cigarettes. If you need help quitting, ask your health care provider.  Contact your health care provider if you have any questions. Keep all prenatal visits as told by your health care provider. This is important. This information is not intended to replace advice given to you by your health care provider. Make sure you discuss any questions you have with your health care provider.

## 2020-11-27 NOTE — Progress Notes (Signed)
[place] 19+3 wks,breech,anterior placenta gr 0,normal ovaries,cx 4 cm,svp of fluid 4.8 cm,fhr 146 bpm,EFW 306 G 58%,anatomy complete,no obvious abnormalities

## 2020-11-29 LAB — INTEGRATED 2
AFP MoM: 1.34
Alpha-Fetoprotein: 63.3 ng/mL
Crown Rump Length: 68.9 mm
DIA MoM: 0.75
DIA Value: 123.6 pg/mL
Estriol, Unconjugated: 2.38 ng/mL
Gest. Age on Collection Date: 13 weeks
Gestational Age: 19.4 weeks
Maternal Age at EDD: 27.6 yr
Nuchal Translucency (NT): 1.5 mm
Nuchal Translucency MoM: 0.96
Number of Fetuses: 1
PAPP-A MoM: 0.85
PAPP-A Value: 928.7 ng/mL
Test Results:: NEGATIVE
Weight: 164 [lb_av]
Weight: 164 [lb_av]
hCG MoM: 0.91
hCG Value: 20 IU/mL
uE3 MoM: 1.23

## 2020-12-02 NOTE — L&D Delivery Note (Signed)
Patient: Aimee Burns MRN: [id number]  GBS status: Negative, IAP given: None   Patient is a 30 y.o. now G3P3 s/p NSVD at [redacted]w[redacted]d, who was admitted for SOL. SROM 1h 15m prior to delivery with clear fluid.    Delivery Note At 9:55 AM a viable female was delivered via Vaginal, Spontaneous (Presentation: OA     ).  APGAR: 9, 10; weight pending .   Placenta status: Spontaneous, Intact.  Cord: 3 vessels with the following complications: None.    Anesthesia: Epidural Episiotomy: None Lacerations: None Suture Repair: N/A Est. Blood Loss (mL): 427  Head delivered OA. No nuchal cord present. Shoulder and body delivered in usual fashion. Infant with spontaneous cry, placed on mother's abdomen, dried and bulb suctioned. Cord clamped x 2 after 1-minute delay, and cut by family member. Cord blood drawn. Placenta delivered spontaneously with gentle cord traction. Fundus firm with massage and Pitocin. Perineum inspected and found to have no lacerations. Brisk bleeding was noted. TXA was given. Lower uterine segment sweep performed, yielding several large clots. Hemostasis then achieved.    Mom to postpartum.  Baby to Couplet care / Skin to Skin.  Melina Schools 04/17/2021, 10:38 AM

## 2020-12-06 ENCOUNTER — Ambulatory Visit (INDEPENDENT_AMBULATORY_CARE_PROVIDER_SITE_OTHER): Payer: 59 | Admitting: Advanced Practice Midwife

## 2020-12-06 ENCOUNTER — Other Ambulatory Visit: Payer: Self-pay

## 2020-12-06 VITALS — BP 101/53 | HR 109 | Wt 166.6 lb

## 2020-12-06 DIAGNOSIS — Z331 Pregnant state, incidental: Secondary | ICD-10-CM

## 2020-12-06 DIAGNOSIS — Z3480 Encounter for supervision of other normal pregnancy, unspecified trimester: Secondary | ICD-10-CM

## 2020-12-06 DIAGNOSIS — Z3A20 20 weeks gestation of pregnancy: Secondary | ICD-10-CM

## 2020-12-06 DIAGNOSIS — Z1389 Encounter for screening for other disorder: Secondary | ICD-10-CM

## 2020-12-06 LAB — POCT URINALYSIS DIPSTICK OB
Blood, UA: NEGATIVE
Glucose, UA: NEGATIVE
Leukocytes, UA: NEGATIVE
Nitrite, UA: NEGATIVE
POC,PROTEIN,UA: NEGATIVE

## 2020-12-06 NOTE — Progress Notes (Signed)
   LOW-RISK PREGNANCY VISIT- work in for 'not feeling right' Patient name: Aimee Burns MRN [id number]  Date of birth: 1991/07/31 Chief Complaint:   Routine Prenatal Visit  History of Present Illness:   Aimee Burns is a 30 y.o. G61P2002 female at [redacted]w[redacted]d with an Estimated Date of Delivery: 04/20/21 being seen today for ongoing management of a low-risk pregnancy.  Today she reports onset yesterday of 'achiness' that goes from her back down both legs; no fever; intermittent non-prod cough at night; no dysuria; no N/V; hasn't had sick contacts. Contractions: Not present. Vag. Bleeding: None.  Movement: Present. denies leaking of fluid. Review of Systems:   Pertinent items are noted in HPI Denies abnormal vaginal discharge w/ itching/odor/irritation, headaches, visual changes, shortness of breath, chest pain, abdominal pain, severe nausea/vomiting, or problems with urination or bowel movements unless otherwise stated above. Pertinent History Reviewed:  Reviewed past medical,surgical, social, obstetrical and family history.  Reviewed problem list, medications and allergies. Physical Assessment:   Vitals:   12/06/20 1028  BP: (!) 101/53  Pulse: (!) 109  Weight: 166 lb 9.6 oz (75.6 kg)  Body mass index is 26.09 kg/m.        Physical Examination:   General appearance: Well appearing, and in no distress  Mental status: Alert, oriented to person, place, and time  Skin: Warm & dry  Cardiovascular: Normal heart rate noted  Respiratory: Normal respiratory effort, no distress  Abdomen: Soft, gravid, nontender  Pelvic: Cervical exam deferred         Extremities: Edema: None  Fetal Status: Fetal Heart Rate (bpm): 164   Movement: Present    Results for orders placed or performed in visit on 12/06/20 (from the past 24 hour(s))  POC Urinalysis Dipstick OB   Collection Time: 12/06/20 10:28 AM  Result Value Ref Range   Color, UA     Clarity, UA     Glucose, UA Negative Negative    Bilirubin, UA     Ketones, UA mod    Spec Grav, UA     Blood, UA neg    pH, UA     POC,PROTEIN,UA Negative Negative, Trace, Small (1+), Moderate (2+), Large (3+), 4+   Urobilinogen, UA     Nitrite, UA neg    Leukocytes, UA Negative Negative   Appearance     Odor      Assessment & Plan:  1) Low-risk pregnancy G3P2002 at [redacted]w[redacted]d with an Estimated Date of Delivery: 04/20/21   2) Possible beginning of viral illness, given supportive care measures of increasing fluids, rest, ES Tylenol for achiness, Mucinex/Robittusin if cough worsens; consider Covid/flu testing if develops more definitive symptoms and notify us if +Covid with SOB   Meds: No orders of the defined types were placed in this encounter.  Labs/procedures today: none  Plan:  Continue routine obstetrical care   Reviewed: Preterm labor symptoms and general obstetric precautions including but not limited to vaginal bleeding, contractions, leaking of fluid and fetal movement were reviewed in detail with the patient.  All questions were answered. Didn't ask about home bp cuff. Check bp weekly, let us know if >140/90.   Follow-up: Return for As scheduled.  Orders Placed This Encounter  Procedures  . POC Urinalysis Dipstick OB   Arabella Merles [facility] 12/06/2020 10:52 AM

## 2020-12-25 ENCOUNTER — Encounter: Payer: 59 | Admitting: Women's Health

## 2020-12-25 ENCOUNTER — Encounter: Payer: Self-pay | Admitting: Women's Health

## 2020-12-25 ENCOUNTER — Telehealth (INDEPENDENT_AMBULATORY_CARE_PROVIDER_SITE_OTHER): Payer: 59 | Admitting: Women's Health

## 2020-12-25 VITALS — BP 124/69 | HR 80

## 2020-12-25 DIAGNOSIS — Z3480 Encounter for supervision of other normal pregnancy, unspecified trimester: Secondary | ICD-10-CM | POA: Diagnosis not present

## 2020-12-25 DIAGNOSIS — Z3482 Encounter for supervision of other normal pregnancy, second trimester: Secondary | ICD-10-CM | POA: Diagnosis not present

## 2020-12-25 NOTE — Progress Notes (Signed)
   TELEHEALTH VIRTUAL OBSTETRICS VISIT ENCOUNTER NOTE Patient name: Aimee Burns MRN [id number]  Date of birth: Nov 18, 1991  I connected with patient on 12/26/20 at  4:10 PM EST by phone (unable to get mychart to work) and verified that I am speaking with the correct person using two identifiers. Pt is not currently in our office, she is at home.  The provider is in the office.    I discussed the limitations, risks, security and privacy concerns of performing an evaluation and management service by telephone and the availability of in person appointments. I also discussed with the patient that there may be a patient responsible charge related to this service. The patient expressed understanding and agreed to proceed.  Chief Complaint:   No chief complaint on file.  History of Present Illness:   Aimee Burns is a 30 y.o. G77P2002 female at [redacted]w[redacted]d with an Estimated Date of Delivery: 04/20/21 being evaluated today for ongoing management of a low-risk pregnancy.  Depression screen [facility] 2/9 10/13/2020  Decreased Interest 0  Down, Depressed, Hopeless 0  PHQ - 2 Score 0  Altered sleeping 0  Tired, decreased energy 0  Change in appetite 0  Feeling bad or failure about yourself  0  Trouble concentrating 0  Moving slowly or fidgety/restless 0  Suicidal thoughts 0  PHQ-9 Score 0    Today she reports no complaints. Good fm. No contractions/bleeding/LOF> denies leaking of fluid. Review of Systems:   Pertinent items are noted in HPI Denies abnormal vaginal discharge w/ itching/odor/irritation, headaches, visual changes, shortness of breath, chest pain, abdominal pain, severe nausea/vomiting, or problems with urination or bowel movements unless otherwise stated above. Pertinent History Reviewed:  Reviewed past medical,surgical, social, obstetrical and family history.  Reviewed problem list, medications and allergies. Physical Assessment:   Vitals:   12/25/20 1645  BP: 124/69   Pulse: 80  There is no height or weight on file to calculate BMI.        Physical Examination:   General:  Alert, oriented and cooperative.   Mental Status: Normal mood and affect perceived. Normal judgment and thought content.  Rest of physical exam deferred due to type of encounter  No results found for this or any previous visit (from the past 24 hour(s)).  Assessment & Plan:  1) Pregnancy G3P2002 at [redacted]w[redacted]d with an Estimated Date of Delivery: 04/20/21   2) H/O FGR, EFW @ 28, 32, 36wks   Meds: No orders of the defined types were placed in this encounter.  Labs/procedures today: none  Plan:  Continue routine obstetrical care.  Has home bp cuff.  Check bp weekly, let us know if >140/90.  Next visit: prefers will be in person for pn2    Reviewed: Preterm labor symptoms and general obstetric precautions including but not limited to vaginal bleeding, contractions, leaking of fluid and fetal movement were reviewed in detail with the patient. The patient was advised to call back or seek an in-person office evaluation/go to [facility] at [facility] for any urgent or concerning symptoms. All questions were answered. Please refer to After Visit Summary for other counseling recommendations.    I provided 15 minutes of non-face-to-face time during this encounter.  Follow-up: Return in about 4 weeks (around 01/22/2021) for LROB, PN2, US:EFW, CNM, in person.  No orders of the defined types were placed in this encounter.  [place], [facility] 12/26/2020 11:20 PM

## 2020-12-25 NOTE — Progress Notes (Signed)
This encounter was created in error - please disregard.

## 2020-12-25 NOTE — Patient Instructions (Signed)
Aimee Burns, I greatly value your feedback.  If you receive a survey following your visit with [place] today, we appreciate you taking the time to fill it out.  Thanks, Knute Neu, CNM, WHNP-BC   You will have your sugar test next visit.  Please do not eat or drink anything after midnight the night before you come, not even water.  You will be here for at least two hours.  Please make an appointment online for the bloodwork at [email or web address] for 8:30am (or as close to this as possible). Make sure you select the [facility] service center. The day of the appointment, check in with our office first, then you will go to [place] to start the sugar test.    [place] [facility]  [id number] Entrance C, located off of [address] parking  Go to [email or web address] to register for FREE online childbirth classes   Call the office [phone]) or go to [facility] if:  You begin to have strong, frequent contractions  Your water breaks.  Sometimes it is a big gush of fluid, sometimes it is just a trickle that keeps getting your panties wet or running down your legs  You have vaginal bleeding.  It is normal to have a small amount of spotting if your cervix was checked.   You don't feel your baby moving like normal.  If you don't, get you something to eat and drink and lay down and focus on feeling your baby move.   If your baby is still not moving like normal, you should call the office or go to [place] [facility]/Family Doctors:  [place] [phone]                 [place] [phone] (usually not accepting new patients unless you have family there already, you are always welcome to call and ask)       [facility] Department [phone]       [facility] Pediatricians/Family Doctors:   [facility]: [phone]  [facility]: [phone]  [facility]: [place] Doctors:   [facility]: [place] [facility]: [place]:  [place]: [phone]   Home Blood Pressure Monitoring for Patients   Your provider has recommended that you check your blood pressure (BP) at least once a week at home. If you do not have a blood pressure cuff at home, one will be provided for you. Contact your provider if you have not received your monitor within 1 week.   Helpful Tips for Accurate Home Blood Pressure Checks  . Don't smoke, exercise, or drink caffeine 30 minutes before checking your BP . Use the restroom before checking your BP (a full bladder can raise your pressure) . Relax in a comfortable upright chair . Feet on the ground . Left arm resting comfortably on a flat surface at the level of your heart . Legs uncrossed . Back supported . Sit quietly and don't talk . Place the cuff on your bare arm . Adjust snuggly, so that only two fingertips can fit between your skin and the top of the cuff . Check 2 readings separated by at least one minute . Keep a log of your BP readings . For a visual, please  reference this diagram: http://ccnc.care/bpdiagram  Provider Name: [facility]     Phone: [phone]  Zone 1: ALL CLEAR  Continue to monitor your symptoms:  . BP reading is less than 140 (top number) or less than 90 (bottom number)  . No right upper stomach pain . No headaches or seeing spots . No feeling nauseated or throwing up . No swelling in face and hands  Zone 2: CAUTION Call your doctor's office for any of the following:  . BP reading is greater than 140 (top number) or greater than 90 (bottom number)  . Stomach pain under your ribs in the middle or right side . Headaches or seeing spots . Feeling nauseated or throwing  up . Swelling in face and hands  Zone 3: EMERGENCY  Seek immediate medical care if you have any of the following:  . BP reading is greater than160 (top number) or greater than 110 (bottom number) . Severe headaches not improving with Tylenol . Serious difficulty catching your breath . Any worsening symptoms from Zone 2   Second Trimester of Pregnancy The second trimester is from week 13 through week 28, months 4 through 6. The second trimester is often a time when you feel your best. Your body has also adjusted to being pregnant, and you begin to feel better physically. Usually, morning sickness has lessened or quit completely, you may have more energy, and you may have an increase in appetite. The second trimester is also a time when the fetus is growing rapidly. At the end of the sixth month, the fetus is about 9 inches long and weighs about 1 pounds. You will likely begin to feel the baby move (quickening) between 18 and 20 weeks of the pregnancy. BODY CHANGES Your body goes through many changes during pregnancy. The changes vary from woman to woman.   Your weight will continue to increase. You will notice your lower abdomen bulging out.  You may begin to get stretch marks on your hips, abdomen, and breasts.  You may develop headaches that can be relieved by medicines approved by your health care provider.  You may urinate more often because the fetus is pressing on your bladder.  You may develop or continue to have heartburn as a result of your pregnancy.  You may develop constipation because certain hormones are causing the muscles that push waste through your intestines to slow down.  You may develop hemorrhoids or swollen, bulging veins (varicose veins).  You may have back pain because of the weight gain and pregnancy hormones relaxing your joints between the bones in your pelvis and as a result of a shift in weight and the muscles that support your balance.  Your breasts will  continue to grow and be tender.  Your gums may bleed and may be sensitive to brushing and flossing.  Dark spots or blotches (chloasma, mask of pregnancy) may develop on your face. This will likely fade after the baby is born.  A dark line from your belly button to the pubic area (linea nigra) may appear. This will likely fade after the baby is born.  You may have changes in your hair. These can include thickening of your hair, rapid growth, and changes in texture. Some women also have hair loss during or after pregnancy, or hair that feels dry or thin. Your hair will most likely return to normal after your baby is born. WHAT TO EXPECT AT YOUR PRENATAL VISITS During a routine prenatal visit:  You  will be weighed to make sure you and the fetus are growing normally.  Your blood pressure will be taken.  Your abdomen will be measured to track your baby's growth.  The fetal heartbeat will be listened to.  Any test results from the previous visit will be discussed. Your health care provider may ask you:  How you are feeling.  If you are feeling the baby move.  If you have had any abnormal symptoms, such as leaking fluid, bleeding, severe headaches, or abdominal cramping.  If you have any questions. Other tests that may be performed during your second trimester include:  Blood tests that check for:  Low iron levels (anemia).  Gestational diabetes (between 24 and 28 weeks).  Rh antibodies.  Urine tests to check for infections, diabetes, or protein in the urine.  An ultrasound to confirm the proper growth and development of the baby.  An amniocentesis to check for possible genetic problems.  Fetal screens for spina bifida and Down syndrome. HOME CARE INSTRUCTIONS   Avoid all smoking, herbs, alcohol, and unprescribed drugs. These chemicals affect the formation and growth of the baby.  Follow your health care provider's instructions regarding medicine use. There are medicines  that are either safe or unsafe to take during pregnancy.  Exercise only as directed by your health care provider. Experiencing uterine cramps is a good sign to stop exercising.  Continue to eat regular, healthy meals.  Wear a good support bra for breast tenderness.  Do not use hot tubs, steam rooms, or saunas.  Wear your seat belt at all times when driving.  Avoid raw meat, uncooked cheese, cat litter boxes, and soil used by cats. These carry germs that can cause birth defects in the baby.  Take your prenatal vitamins.  Try taking a stool softener (if your health care provider approves) if you develop constipation. Eat more high-fiber foods, such as fresh vegetables or fruit and whole grains. Drink plenty of fluids to keep your urine clear or pale yellow.  Take warm sitz baths to soothe any pain or discomfort caused by hemorrhoids. Use hemorrhoid cream if your health care provider approves.  If you develop varicose veins, wear support hose. Elevate your feet for 15 minutes, 3-4 times a day. Limit salt in your diet.  Avoid heavy lifting, wear low heel shoes, and practice good posture.  Rest with your legs elevated if you have leg cramps or low back pain.  Visit your dentist if you have not gone yet during your pregnancy. Use a soft toothbrush to brush your teeth and be gentle when you floss.  A sexual relationship may be continued unless your health care provider directs you otherwise.  Continue to go to all your prenatal visits as directed by your health care provider. SEEK MEDICAL CARE IF:   You have dizziness.  You have mild pelvic cramps, pelvic pressure, or nagging pain in the abdominal area.  You have persistent nausea, vomiting, or diarrhea.  You have a bad smelling vaginal discharge.  You have pain with urination. SEEK IMMEDIATE MEDICAL CARE IF:   You have a fever.  You are leaking fluid from your vagina.  You have spotting or bleeding from your vagina.  You  have severe abdominal cramping or pain.  You have rapid weight gain or loss.  You have shortness of breath with chest pain.  You notice sudden or extreme swelling of your face, hands, ankles, feet, or legs.  You have not felt your baby move  in over an hour.  You have severe headaches that do not go away with medicine.  You have vision changes. Document Released: 11/12/2001 Document Revised: 11/23/2013 Document Reviewed: 01/19/2013 [facility] Patient Information 2015 [place], [place]. This information is not intended to replace advice given to you by your health care provider. Make sure you discuss any questions you have with your health care provider.

## 2020-12-26 ENCOUNTER — Encounter: Payer: Self-pay | Admitting: Women's Health

## 2021-01-22 ENCOUNTER — Other Ambulatory Visit: Payer: Self-pay | Admitting: Women's Health

## 2021-01-22 DIAGNOSIS — Z8759 Personal history of other complications of pregnancy, childbirth and the puerperium: Secondary | ICD-10-CM

## 2021-01-23 ENCOUNTER — Other Ambulatory Visit: Payer: 59

## 2021-01-23 ENCOUNTER — Ambulatory Visit (INDEPENDENT_AMBULATORY_CARE_PROVIDER_SITE_OTHER): Payer: 59

## 2021-01-23 ENCOUNTER — Other Ambulatory Visit: Payer: Self-pay

## 2021-01-23 ENCOUNTER — Encounter: Payer: Self-pay | Admitting: Women's Health

## 2021-01-23 ENCOUNTER — Ambulatory Visit (INDEPENDENT_AMBULATORY_CARE_PROVIDER_SITE_OTHER): Payer: 59 | Admitting: Women's Health

## 2021-01-23 VITALS — BP 124/71 | HR 88 | Wt 171.0 lb

## 2021-01-23 DIAGNOSIS — Z3A27 27 weeks gestation of pregnancy: Secondary | ICD-10-CM

## 2021-01-23 DIAGNOSIS — Z6791 Unspecified blood type, Rh negative: Secondary | ICD-10-CM

## 2021-01-23 DIAGNOSIS — Z3482 Encounter for supervision of other normal pregnancy, second trimester: Secondary | ICD-10-CM

## 2021-01-23 DIAGNOSIS — Z8759 Personal history of other complications of pregnancy, childbirth and the puerperium: Secondary | ICD-10-CM | POA: Diagnosis not present

## 2021-01-23 DIAGNOSIS — Z3480 Encounter for supervision of other normal pregnancy, unspecified trimester: Secondary | ICD-10-CM

## 2021-01-23 DIAGNOSIS — O26899 Other specified pregnancy related conditions, unspecified trimester: Secondary | ICD-10-CM

## 2021-01-23 DIAGNOSIS — Z131 Encounter for screening for diabetes mellitus: Secondary | ICD-10-CM

## 2021-01-23 NOTE — Progress Notes (Signed)
   LOW-RISK PREGNANCY VISIT Patient name: Aimee Burns MRN [id number]  Date of birth: [birth year] Chief Complaint:   Routine Prenatal Visit  History of Present Illness:   Aimee Burns is a 30 y.o. G58P2002 female at [redacted]w[redacted]d with an Estimated Date of Delivery: 04/20/21 being seen today for ongoing management of a low-risk pregnancy.  Depression screen [facility] 2/9 10/13/2020  Decreased Interest 0  Down, Depressed, Hopeless 0  PHQ - 2 Score 0  Altered sleeping 0  Tired, decreased energy 0  Change in appetite 0  Feeling bad or failure about yourself  0  Trouble concentrating 0  Moving slowly or fidgety/restless 0  Suicidal thoughts 0  PHQ-9 Score 0     Today she reports constipation, bleeding w/ hemorrhoids. Varicose veins whole Lt leg/vulva. Contractions: Not present. Vag. Bleeding: None.  Movement: Present. denies leaking of fluid. Review of Systems:   Pertinent items are noted in HPI Denies abnormal vaginal discharge w/ itching/odor/irritation, headaches, visual changes, shortness of breath, chest pain, abdominal pain, severe nausea/vomiting, or problems with urination or bowel movements unless otherwise stated above. Pertinent History Reviewed:  Reviewed past medical,surgical, social, obstetrical and family history.  Reviewed problem list, medications and allergies. Physical Assessment:   Vitals:   01/23/21 0949  BP: 124/71  Pulse: 88  Weight: 171 lb (77.6 kg)  Body mass index is 26.78 kg/m.        Physical Examination:   General appearance: Well appearing, and in no distress  Mental status: Alert, oriented to person, place, and time  Skin: Warm & dry  Cardiovascular: Normal heart rate noted  Respiratory: Normal respiratory effort, no distress  Abdomen: Soft, gravid, nontender  Pelvic: Cervical exam deferred         Extremities: Edema: None, varicose veins Lt leg/thigh/calf, none thrombosed  Fetal Status: Fetal Heart Rate (bpm): 152 u/s   Movement: Present    [place] 68+1 wks,cephalic,anterior placenta gr 0,normal ovaries,AFI 15 cm,cx 3.3 cm,fhr 152 bpm,efw 1177 g 59%   Chaperone: N/A   No results found for this or any previous visit (from the past 24 hour(s)).  Assessment & Plan:  1) Low-risk pregnancy G3P2002 at [redacted]w[redacted]d with an Estimated Date of Delivery: 04/20/21   2) Constipation w/ bleeding hemorrhoid, gave printed prevention/relief measures   3) Varicose veins> bASA daily, maternity compression hose. Reviewed s/s thrombosis  4) H/O FGR> EFW 59% today    Meds: No orders of the defined types were placed in this encounter.  Labs/procedures today: PN2 and declined flu & tdap today, wants tdap next visit w/ rhogam  Plan:  Continue routine obstetrical care  Next visit: prefers in person    Reviewed: Preterm labor symptoms and general obstetric precautions including but not limited to vaginal bleeding, contractions, leaking of fluid and fetal movement were reviewed in detail with the patient.  All questions were answered. Has home bp cuff. Check bp weekly, let us know if >140/90.   Follow-up: Return in about 4 weeks (around 02/20/2021) for [place], CNM, in person, US:[facility]W.  Future Appointments  Date Time Provider [place]  02/21/2021  4:10 PM Myrtis Ser, CNM [facility]    Orders Placed This Encounter  Procedures  . US OB Follow Up   [place], [facility] 01/23/2021 10:17 AM

## 2021-01-23 NOTE — Patient Instructions (Addendum)
Aimee Burns, I greatly value your feedback.  If you receive a survey following your visit with [place] today, we appreciate you taking the time to fill it out.  Thanks, Knute Neu, CNM, WHNP-BC   [facility] ([place], [place] [address]) Entrance C, located off of [address]   Constipation  Drink plenty of fluid, preferably water, throughout the day  Eat foods high in fiber such as fruits, vegetables, and grains  Exercise, such as walking, is a good way to keep your bowels regular  Drink warm fluids, especially warm prune juice, or decaf coffee  Eat a 1/2 cup of real oatmeal (not instant), 1/2 cup applesauce, and 1/2-1 cup warm prune juice every day  If needed, you may take Colace (docusate sodium) stool softener once or twice a day to help keep the stool soft. If you are pregnant, wait until you are out of your first trimester (12-14 weeks of pregnancy)  If you still are having problems with constipation, you may take Miralax once daily as needed to help keep your bowels regular.  If you are pregnant, wait until you are out of your first trimester (12-14 weeks of pregnancy)    Go to [email or web address] to register for FREE online childbirth classes   Call the office [phone]) or go to [facility] if:  You begin to have strong, frequent contractions  Your water breaks.  Sometimes it is a big gush of fluid, sometimes it is just a trickle that keeps getting your panties wet or running down your legs  You have vaginal bleeding.  It is normal to have a small amount of spotting if your cervix was checked.   You don't feel your baby moving like normal.  If you don't, get you something to eat and drink and lay down and focus on feeling your baby move.  You should feel at least 10 movements in 2 hours.  If you don't, you should call the office or go to [facility].    Tdap Vaccine  It is recommended  that you get the Tdap vaccine during the third trimester of EACH pregnancy to help protect your baby from getting pertussis (whooping cough)  27-36 weeks is the BEST time to do this so that you can pass the protection on to your baby. During pregnancy is better than after pregnancy, but if you are unable to get it during pregnancy it will be offered at the hospital.   You can get this vaccine with [place], at the health department, your family doctor, or some local pharmacies  Everyone who will be around your baby should also be up-to-date on their vaccines before the baby comes. Adults (who are not pregnant) only need 1 dose of Tdap during adulthood.   [place] Pediatricians/Family Doctors:  [facility] [place] [phone]                 [place] [phone] (usually not accepting new patients unless you have family there already, you are always welcome to call and ask)       [facility] Department [phone]       [facility] Pediatricians/Family Doctors:   [facility]: [phone]  [facility]: [phone]  [facility]: [place] Doctors:   [facility]: [place]  [facility]: [place] Doctors:  [place]: [phone]   Home Blood Pressure Monitoring for Patients   Your provider has recommended that you check your blood pressure (BP) at least once a week at home. If you do not have a blood pressure cuff at home, one will be provided for you. Contact your provider if you have not received your monitor within 1 week.   Helpful Tips for Accurate Home Blood Pressure Checks  . Don't smoke, exercise, or drink caffeine 30 minutes before checking your BP . Use the restroom before checking your BP (a full bladder can raise your pressure) . Relax in a comfortable  upright chair . Feet on the ground . Left arm resting comfortably on a flat surface at the level of your heart . Legs uncrossed . Back supported . Sit quietly and don't talk . Place the cuff on your bare arm . Adjust snuggly, so that only two fingertips can fit between your skin and the top of the cuff . Check 2 readings separated by at least one minute . Keep a log of your BP readings . For a visual, please reference this diagram: http://ccnc.care/bpdiagram  Provider Name: [facility]     Phone: [phone]  Zone 1: ALL CLEAR  Continue to monitor your symptoms:  . BP reading is less than 140 (top number) or less than 90 (bottom number)  . No right upper stomach pain . No headaches or seeing spots . No feeling nauseated or throwing up . No swelling in face and hands  Zone 2: CAUTION Call your doctor's office for any of the following:  . BP reading is greater than 140 (top number) or greater than 90 (bottom number)  . Stomach pain under your ribs in the middle or right side . Headaches or seeing spots . Feeling nauseated or throwing up . Swelling in face and hands  Zone 3: EMERGENCY  Seek immediate medical care if you have any of the following:  . BP reading is greater than160 (top number) or greater than 110 (bottom number) . Severe headaches not improving with Tylenol . Serious difficulty catching your breath . Any worsening symptoms from Zone 2   Third Trimester of Pregnancy The third trimester is from week 29 through week 42, months 7 through 9. The third trimester is a time when the fetus is growing rapidly. At the end of the ninth month, the fetus is about 20 inches in length and weighs 6-10 pounds.  BODY CHANGES Your body goes through many changes during pregnancy. The changes vary from woman to woman.   Your weight will continue to increase. You can expect to gain 25-35 pounds (11-16 kg) by the end of the pregnancy.  You may begin to get stretch marks on  your hips, abdomen, and breasts.  You may urinate more often because the fetus is moving lower into your pelvis and pressing on your bladder.  You may develop or continue to have heartburn as a result of your pregnancy.  You may develop constipation because certain hormones are causing the muscles that push waste through your intestines to slow down.  You may develop hemorrhoids or swollen, bulging veins (varicose veins).  You may have pelvic pain because of the weight gain and pregnancy hormones relaxing your joints between the bones in your pelvis. Backaches may result from overexertion of the muscles supporting your posture.  You may have changes in your hair. These can include thickening of your  hair, rapid growth, and changes in texture. Some women also have hair loss during or after pregnancy, or hair that feels dry or thin. Your hair will most likely return to normal after your baby is born.  Your breasts will continue to grow and be tender. A yellow discharge may leak from your breasts called colostrum.  Your belly button may stick out.  You may feel short of breath because of your expanding uterus.  You may notice the fetus "dropping," or moving lower in your abdomen.  You may have a bloody mucus discharge. This usually occurs a few days to a week before labor begins.  Your cervix becomes thin and soft (effaced) near your due date. WHAT TO EXPECT AT YOUR PRENATAL EXAMS  You will have prenatal exams every 2 weeks until week 36. Then, you will have weekly prenatal exams. During a routine prenatal visit:  You will be weighed to make sure you and the fetus are growing normally.  Your blood pressure is taken.  Your abdomen will be measured to track your baby's growth.  The fetal heartbeat will be listened to.  Any test results from the previous visit will be discussed.  You may have a cervical check near your due date to see if you have effaced. At around 36 weeks, your  caregiver will check your cervix. At the same time, your caregiver will also perform a test on the secretions of the vaginal tissue. This test is to determine if a type of bacteria, Group B streptococcus, is present. Your caregiver will explain this further. Your caregiver may ask you:  What your birth plan is.  How you are feeling.  If you are feeling the baby move.  If you have had any abnormal symptoms, such as leaking fluid, bleeding, severe headaches, or abdominal cramping.  If you have any questions. Other tests or screenings that may be performed during your third trimester include:  Blood tests that check for low iron levels (anemia).  Fetal testing to check the health, activity level, and growth of the fetus. Testing is done if you have certain medical conditions or if there are problems during the pregnancy. FALSE LABOR You may feel small, irregular contractions that eventually go away. These are called Braxton Hicks contractions, or false labor. Contractions may last for hours, days, or even weeks before true labor sets in. If contractions come at regular intervals, intensify, or become painful, it is best to be seen by your caregiver.  SIGNS OF LABOR   Menstrual-like cramps.  Contractions that are 5 minutes apart or less.  Contractions that start on the top of the uterus and spread down to the lower abdomen and back.  A sense of increased pelvic pressure or back pain.  A watery or bloody mucus discharge that comes from the vagina. If you have any of these signs before the 37th week of pregnancy, call your caregiver right away. You need to go to the hospital to get checked immediately. HOME CARE INSTRUCTIONS   Avoid all smoking, herbs, alcohol, and unprescribed drugs. These chemicals affect the formation and growth of the baby.  Follow your caregiver's instructions regarding medicine use. There are medicines that are either safe or unsafe to take during  pregnancy.  Exercise only as directed by your caregiver. Experiencing uterine cramps is a good sign to stop exercising.  Continue to eat regular, healthy meals.  Wear a good support bra for breast tenderness.  Do not use hot tubs, steam rooms,  or saunas.  Wear your seat belt at all times when driving.  Avoid raw meat, uncooked cheese, cat litter boxes, and soil used by cats. These carry germs that can cause birth defects in the baby.  Take your prenatal vitamins.  Try taking a stool softener (if your caregiver approves) if you develop constipation. Eat more high-fiber foods, such as fresh vegetables or fruit and whole grains. Drink plenty of fluids to keep your urine clear or pale yellow.  Take warm sitz baths to soothe any pain or discomfort caused by hemorrhoids. Use hemorrhoid cream if your caregiver approves.  If you develop varicose veins, wear support hose. Elevate your feet for 15 minutes, 3-4 times a day. Limit salt in your diet.  Avoid heavy lifting, wear low heal shoes, and practice good posture.  Rest a lot with your legs elevated if you have leg cramps or low back pain.  Visit your dentist if you have not gone during your pregnancy. Use a soft toothbrush to brush your teeth and be gentle when you floss.  A sexual relationship may be continued unless your caregiver directs you otherwise.  Do not travel far distances unless it is absolutely necessary and only with the approval of your caregiver.  Take prenatal classes to understand, practice, and ask questions about the labor and delivery.  Make a trial run to the hospital.  Pack your hospital bag.  Prepare the baby's nursery.  Continue to go to all your prenatal visits as directed by your caregiver. SEEK MEDICAL CARE IF:  You are unsure if you are in labor or if your water has broken.  You have dizziness.  You have mild pelvic cramps, pelvic pressure, or nagging pain in your abdominal area.  You have  persistent nausea, vomiting, or diarrhea.  You have a bad smelling vaginal discharge.  You have pain with urination. SEEK IMMEDIATE MEDICAL CARE IF:   You have a fever.  You are leaking fluid from your vagina.  You have spotting or bleeding from your vagina.  You have severe abdominal cramping or pain.  You have rapid weight loss or gain.  You have shortness of breath with chest pain.  You notice sudden or extreme swelling of your face, hands, ankles, feet, or legs.  You have not felt your baby move in over an hour.  You have severe headaches that do not go away with medicine.  You have vision changes. Document Released: 11/12/2001 Document Revised: 11/23/2013 Document Reviewed: 01/19/2013 [facility] Patient Information 2015 [place], [place]. This information is not intended to replace advice given to you by your health care provider. Make sure you discuss any questions you have with your health care provider.

## 2021-01-23 NOTE — Progress Notes (Signed)
[place] 75+4 wks,cephalic,anterior placenta gr 0,normal ovaries,AFI 15 cm,cx 3.3 cm,fhr 152 bpm,efw 1177 g 59%

## 2021-01-24 LAB — CBC
Hematocrit: 31.3 % — ABNORMAL LOW (ref 34.0–46.6)
Hemoglobin: 10.7 g/dL — ABNORMAL LOW (ref 11.1–15.9)
MCH: 31 pg (ref 26.6–33.0)
MCHC: 34.2 g/dL (ref 31.5–35.7)
MCV: 91 fL (ref 79–97)
Platelets: 311 10*3/uL (ref 150–450)
RBC: 3.45 x10E6/uL — ABNORMAL LOW (ref 3.77–5.28)
RDW: 13.1 % (ref 11.7–15.4)
WBC: 10.1 10*3/uL (ref 3.4–10.8)

## 2021-01-24 LAB — GLUCOSE TOLERANCE, 2 HOURS W/ 1HR
Glucose, 1 hour: 107 mg/dL (ref 65–179)
Glucose, 2 hour: 82 mg/dL (ref 65–152)
Glucose, Fasting: 74 mg/dL (ref 65–91)

## 2021-01-24 LAB — ANTIBODY SCREEN: Antibody Screen: NEGATIVE

## 2021-01-24 LAB — HIV ANTIBODY (ROUTINE TESTING W REFLEX): HIV Screen 4th Generation wRfx: NONREACTIVE

## 2021-01-24 LAB — RPR: RPR Ser Ql: NONREACTIVE

## 2021-02-21 ENCOUNTER — Ambulatory Visit (INDEPENDENT_AMBULATORY_CARE_PROVIDER_SITE_OTHER): Payer: 59

## 2021-02-21 ENCOUNTER — Ambulatory Visit (INDEPENDENT_AMBULATORY_CARE_PROVIDER_SITE_OTHER): Payer: 59 | Admitting: Advanced Practice Midwife

## 2021-02-21 ENCOUNTER — Other Ambulatory Visit: Payer: Self-pay

## 2021-02-21 VITALS — BP 114/63 | HR 77 | Wt 174.0 lb

## 2021-02-21 DIAGNOSIS — Z8759 Personal history of other complications of pregnancy, childbirth and the puerperium: Secondary | ICD-10-CM

## 2021-02-21 DIAGNOSIS — Z6791 Unspecified blood type, Rh negative: Secondary | ICD-10-CM | POA: Diagnosis not present

## 2021-02-21 DIAGNOSIS — Z3480 Encounter for supervision of other normal pregnancy, unspecified trimester: Secondary | ICD-10-CM

## 2021-02-21 DIAGNOSIS — O26899 Other specified pregnancy related conditions, unspecified trimester: Secondary | ICD-10-CM

## 2021-02-21 DIAGNOSIS — Z3482 Encounter for supervision of other normal pregnancy, second trimester: Secondary | ICD-10-CM

## 2021-02-21 DIAGNOSIS — Z3A31 31 weeks gestation of pregnancy: Secondary | ICD-10-CM

## 2021-02-21 DIAGNOSIS — Z23 Encounter for immunization: Secondary | ICD-10-CM | POA: Diagnosis not present

## 2021-02-21 NOTE — Progress Notes (Signed)
   LOW-RISK PREGNANCY VISIT Patient name: Aimee Burns MRN [id number]  Date of birth: 01/02/1991 Chief Complaint:   Routine Prenatal Visit  History of Present Illness:   Aimee Burns is a 30 y.o. G33P2002 female at [redacted]w[redacted]d with an Estimated Date of Delivery: 04/20/21 being seen today for ongoing management of a low-risk pregnancy.  Today she reports doing well. Contractions: Not present. Vag. Bleeding: None.  Movement: Present. denies leaking of fluid. Review of Systems:   Pertinent items are noted in HPI Denies abnormal vaginal discharge w/ itching/odor/irritation, headaches, visual changes, shortness of breath, chest pain, abdominal pain, severe nausea/vomiting, or problems with urination or bowel movements unless otherwise stated above. Pertinent History Reviewed:  Reviewed past medical,surgical, social, obstetrical and family history.  Reviewed problem list, medications and allergies. Physical Assessment:   Vitals:   02/21/21 1614  BP: 114/63  Pulse: 77  Weight: 174 lb (78.9 kg)  Body mass index is 27.25 kg/m.        Physical Examination:   General appearance: Well appearing, and in no distress  Mental status: Alert, oriented to person, place, and time  Skin: Warm & dry  Cardiovascular: Normal heart rate noted  Respiratory: Normal respiratory effort, no distress  Abdomen: Soft, gravid, nontender  Pelvic: Cervical exam deferred         Extremities: Edema: None  Fetal Status: Fetal Heart Rate (bpm): 157 u/s   Movement: Present     Growth u/s: [place] 64+1 wks,cephalic,anterior placenta gr 1,afi 15.5 cm,fhr 157 bpm,EFW 1991 g 65%  No results found for this or any previous visit (from the past 24 hour(s)).  Assessment & Plan:  1) Low-risk pregnancy G3P2002 at [redacted]w[redacted]d with an Estimated Date of Delivery: 04/20/21   2) Hx IUGR, EFW 65th% today; will repeat at 36wks  3) Rh neg, Rhogam today   Meds: No orders of the defined types were placed in this  encounter.  Labs/procedures today: Rhogam, Tdap, growth u/s  Plan:  Continue routine obstetrical care   Reviewed: Preterm labor symptoms and general obstetric precautions including but not limited to vaginal bleeding, contractions, leaking of fluid and fetal movement were reviewed in detail with the patient.  All questions were answered. Didn't ask about home bp cuff.  Check bp weekly, let us know if >140/90.   Follow-up: Return in about 2 weeks (around 03/07/2021) for LROB, in person.  Orders Placed This Encounter  Procedures  . RHO (D) Immune Globulin  . Tdap vaccine greater than or equal to 7yo IM   Myrtis Ser [facility] 02/21/2021 4:45 PM

## 2021-02-21 NOTE — Progress Notes (Addendum)
[place] 59+7 wks,cephalic,anterior placenta gr 1,afi 15.5 cm,fhr 157 bpm,EFW 1991 g 65%

## 2021-02-21 NOTE — Patient Instructions (Signed)
Aimee Burns, I greatly value your feedback.  If you receive a survey following your visit with [place] today, we appreciate you taking the time to fill it out.  Thanks, Derrill Memo, [place] at [facility] ([place], [place] [address]) Entrance C, located off of [address] parking  Go to [email or web address] to register for FREE online childbirth classes   Call the office [phone]) or go to [facility] if:  You begin to have strong, frequent contractions  Your water breaks.  Sometimes it is a big gush of fluid, sometimes it is just a trickle that keeps getting your panties wet or running down your legs  You have vaginal bleeding.  It is normal to have a small amount of spotting if your cervix was checked.   You don't feel your baby moving like normal.  If you don't, get you something to eat and drink and lay down and focus on feeling your baby move.  You should feel at least 10 movements in 2 hours.  If you don't, you should call the office or go to [facility].    Tdap Vaccine  It is recommended that you get the Tdap vaccine during the third trimester of EACH pregnancy to help protect your baby from getting pertussis (whooping cough)  27-36 weeks is the BEST time to do this so that you can pass the protection on to your baby. During pregnancy is better than after pregnancy, but if you are unable to get it during pregnancy it will be offered at the hospital.   You can get this vaccine with [place], at the health department, your family doctor, or some local pharmacies  Everyone who will be around your baby should also be up-to-date on their vaccines before the baby comes. Adults (who are not pregnant) only need 1 dose of Tdap during adulthood.   [place] Pediatricians/Family Doctors:  [facility] [phone]                 [place]  [phone] (usually not accepting new patients unless you have family there already, you are always welcome to call and ask)       [facility] Department [phone]       [facility] Pediatricians/Family Doctors:   [facility]: [phone]  [facility]: [phone]  [facility]: [place] Doctors:   [facility]: [facility]: Box:  [place]: [phone]   Home Blood Pressure Monitoring for Patients   Your provider has recommended that you check your blood pressure (BP) at least once a week at home. If you do not have a blood pressure cuff at home, one will be provided for you. Contact your provider if you have not received your monitor within 1 week.   Helpful Tips for Accurate Home Blood Pressure Checks  . Don't smoke, exercise, or drink caffeine 30 minutes before checking your BP . Use the restroom before checking your BP (a full bladder can raise your pressure) . Relax in a comfortable upright chair . Feet on the ground . Left arm resting comfortably on a flat surface at the level of your heart . Legs uncrossed . Back supported . Sit quietly and don't talk . Place the cuff on your bare  arm . Adjust snuggly, so that only two fingertips can fit between your skin and the top of the cuff . Check 2 readings separated by at least one minute . Keep a log of your BP readings . For a visual, please reference this diagram: http://ccnc.care/bpdiagram  Provider Name: [facility]     Phone: [phone]  Zone 1: ALL CLEAR  Continue to monitor your symptoms:  . BP reading is less than 140 (top number) or less than 90 (bottom number)  . No right upper stomach pain . No headaches or seeing spots . No feeling nauseated or throwing up . No swelling in face and hands  Zone 2: CAUTION Call your  doctor's office for any of the following:  . BP reading is greater than 140 (top number) or greater than 90 (bottom number)  . Stomach pain under your ribs in the middle or right side . Headaches or seeing spots . Feeling nauseated or throwing up . Swelling in face and hands  Zone 3: EMERGENCY  Seek immediate medical care if you have any of the following:  . BP reading is greater than160 (top number) or greater than 110 (bottom number) . Severe headaches not improving with Tylenol . Serious difficulty catching your breath . Any worsening symptoms from Zone 2   Third Trimester of Pregnancy The third trimester is from week 29 through week 42, months 7 through 9. The third trimester is a time when the fetus is growing rapidly. At the end of the ninth month, the fetus is about 20 inches in length and weighs 6-10 pounds.  BODY CHANGES Your body goes through many changes during pregnancy. The changes vary from woman to woman.   Your weight will continue to increase. You can expect to gain 25-35 pounds (11-16 kg) by the end of the pregnancy.  You may begin to get stretch marks on your hips, abdomen, and breasts.  You may urinate more often because the fetus is moving lower into your pelvis and pressing on your bladder.  You may develop or continue to have heartburn as a result of your pregnancy.  You may develop constipation because certain hormones are causing the muscles that push waste through your intestines to slow down.  You may develop hemorrhoids or swollen, bulging veins (varicose veins).  You may have pelvic pain because of the weight gain and pregnancy hormones relaxing your joints between the bones in your pelvis. Backaches may result from overexertion of the muscles supporting your posture.  You may have changes in your hair. These can include thickening of your hair, rapid growth, and changes in texture. Some women also have hair loss during or after pregnancy, or hair that  feels dry or thin. Your hair will most likely return to normal after your baby is born.  Your breasts will continue to grow and be tender. A yellow discharge may leak from your breasts called colostrum.  Your belly button may stick out.  You may feel short of breath because of your expanding uterus.  You may notice the fetus "dropping," or moving lower in your abdomen.  You may have a bloody mucus discharge. This usually occurs a few days to a week before labor begins.  Your cervix becomes thin and soft (effaced) near your due date. WHAT TO EXPECT AT YOUR PRENATAL EXAMS  You will have prenatal exams every 2 weeks until week 36. Then, you will have weekly prenatal exams. During a routine prenatal visit:  You  will be weighed to make sure you and the fetus are growing normally.  Your blood pressure is taken.  Your abdomen will be measured to track your baby's growth.  The fetal heartbeat will be listened to.  Any test results from the previous visit will be discussed.  You may have a cervical check near your due date to see if you have effaced. At around 36 weeks, your caregiver will check your cervix. At the same time, your caregiver will also perform a test on the secretions of the vaginal tissue. This test is to determine if a type of bacteria, Group B streptococcus, is present. Your caregiver will explain this further. Your caregiver may ask you:  What your birth plan is.  How you are feeling.  If you are feeling the baby move.  If you have had any abnormal symptoms, such as leaking fluid, bleeding, severe headaches, or abdominal cramping.  If you have any questions. Other tests or screenings that may be performed during your third trimester include:  Blood tests that check for low iron levels (anemia).  Fetal testing to check the health, activity level, and growth of the fetus. Testing is done if you have certain medical conditions or if there are problems during the  pregnancy. FALSE LABOR You may feel small, irregular contractions that eventually go away. These are called Braxton Hicks contractions, or false labor. Contractions may last for hours, days, or even weeks before true labor sets in. If contractions come at regular intervals, intensify, or become painful, it is best to be seen by your caregiver.  SIGNS OF LABOR   Menstrual-like cramps.  Contractions that are 5 minutes apart or less.  Contractions that start on the top of the uterus and spread down to the lower abdomen and back.  A sense of increased pelvic pressure or back pain.  A watery or bloody mucus discharge that comes from the vagina. If you have any of these signs before the 37th week of pregnancy, call your caregiver right away. You need to go to the hospital to get checked immediately. HOME CARE INSTRUCTIONS   Avoid all smoking, herbs, alcohol, and unprescribed drugs. These chemicals affect the formation and growth of the baby.  Follow your caregiver's instructions regarding medicine use. There are medicines that are either safe or unsafe to take during pregnancy.  Exercise only as directed by your caregiver. Experiencing uterine cramps is a good sign to stop exercising.  Continue to eat regular, healthy meals.  Wear a good support bra for breast tenderness.  Do not use hot tubs, steam rooms, or saunas.  Wear your seat belt at all times when driving.  Avoid raw meat, uncooked cheese, cat litter boxes, and soil used by cats. These carry germs that can cause birth defects in the baby.  Take your prenatal vitamins.  Try taking a stool softener (if your caregiver approves) if you develop constipation. Eat more high-fiber foods, such as fresh vegetables or fruit and whole grains. Drink plenty of fluids to keep your urine clear or pale yellow.  Take warm sitz baths to soothe any pain or discomfort caused by hemorrhoids. Use hemorrhoid cream if your caregiver approves.  If you  develop varicose veins, wear support hose. Elevate your feet for 15 minutes, 3-4 times a day. Limit salt in your diet.  Avoid heavy lifting, wear low heal shoes, and practice good posture.  Rest a lot with your legs elevated if you have leg cramps or low back  pain.  Visit your dentist if you have not gone during your pregnancy. Use a soft toothbrush to brush your teeth and be gentle when you floss.  A sexual relationship may be continued unless your caregiver directs you otherwise.  Do not travel far distances unless it is absolutely necessary and only with the approval of your caregiver.  Take prenatal classes to understand, practice, and ask questions about the labor and delivery.  Make a trial run to the hospital.  Pack your hospital bag.  Prepare the baby's nursery.  Continue to go to all your prenatal visits as directed by your caregiver. SEEK MEDICAL CARE IF:  You are unsure if you are in labor or if your water has broken.  You have dizziness.  You have mild pelvic cramps, pelvic pressure, or nagging pain in your abdominal area.  You have persistent nausea, vomiting, or diarrhea.  You have a bad smelling vaginal discharge.  You have pain with urination. SEEK IMMEDIATE MEDICAL CARE IF:   You have a fever.  You are leaking fluid from your vagina.  You have spotting or bleeding from your vagina.  You have severe abdominal cramping or pain.  You have rapid weight loss or gain.  You have shortness of breath with chest pain.  You notice sudden or extreme swelling of your face, hands, ankles, feet, or legs.  You have not felt your baby move in over an hour.  You have severe headaches that do not go away with medicine.  You have vision changes. Document Released: 11/12/2001 Document Revised: 11/23/2013 Document Reviewed: 01/19/2013 [facility] Patient Information 2015 [place], [place]. This information is not intended to replace advice given to you by your health  care provider. Make sure you discuss any questions you have with your health care provider.

## 2021-03-08 ENCOUNTER — Other Ambulatory Visit: Payer: Self-pay

## 2021-03-08 ENCOUNTER — Ambulatory Visit (INDEPENDENT_AMBULATORY_CARE_PROVIDER_SITE_OTHER): Payer: 59 | Admitting: Advanced Practice Midwife

## 2021-03-08 ENCOUNTER — Encounter: Payer: Self-pay | Admitting: Advanced Practice Midwife

## 2021-03-08 VITALS — BP 112/72 | HR 71 | Wt 179.0 lb

## 2021-03-08 DIAGNOSIS — Z8759 Personal history of other complications of pregnancy, childbirth and the puerperium: Secondary | ICD-10-CM

## 2021-03-08 DIAGNOSIS — Z3480 Encounter for supervision of other normal pregnancy, unspecified trimester: Secondary | ICD-10-CM

## 2021-03-08 DIAGNOSIS — Z3A33 33 weeks gestation of pregnancy: Secondary | ICD-10-CM

## 2021-03-08 NOTE — Patient Instructions (Signed)
Aimee Burns, I greatly value your feedback.  If you receive a survey following your visit with [place] today, we appreciate you taking the time to fill it out.  Thanks, Aimee Berthold, DNP, CNM  [place]!!! It is now [place] at [facility] ([place], [place] [address]) Entrance located off of [address] parking   Go to [email or web address] to register for FREE online childbirth classes    Call the office [phone]) or go to [place] if:  You begin to have strong, frequent contractions  Your water breaks.  Sometimes it is a big gush of fluid, sometimes it is just a trickle that keeps getting your panties wet or running down your legs  You have vaginal bleeding.  It is normal to have a small amount of spotting if your cervix was checked.   You don't feel your baby moving like normal.  If you don't, get you something to eat and drink and lay down and focus on feeling your baby move.  You should feel at least 10 movements in 2 hours.  If you don't, you should call the office or go to [place] Blood Pressure Monitoring for Patients   Your provider has recommended that you check your blood pressure (BP) at least once a week at home. If you do not have a blood pressure cuff at home, one will be provided for you. Contact your provider if you have not received your monitor within 1 week.   Helpful Tips for Accurate Home Blood Pressure Checks  . Don't smoke, exercise, or drink caffeine 30 minutes before checking your BP . Use the restroom before checking your BP (a full bladder can raise your pressure) . Relax in a comfortable upright chair . Feet on the ground . Left arm resting comfortably on a flat surface at the level of your heart . Legs uncrossed . Back supported . Sit quietly and don't talk . Place the cuff on your bare arm . Adjust snuggly, so that only two fingertips can  fit between your skin and the top of the cuff . Check 2 readings separated by at least one minute . Keep a log of your BP readings . For a visual, please reference this diagram: http://ccnc.care/bpdiagram  Provider Name: [facility]     Phone: [phone]  Zone 1: ALL CLEAR  Continue to monitor your symptoms:  . BP reading is less than 140 (top number) or less than 90 (bottom number)  . No right upper stomach pain . No headaches or seeing spots . No feeling nauseated or throwing up . No swelling in face and hands  Zone 2: CAUTION Call your doctor's office for any of the following:  . BP reading is greater than 140 (top number) or greater than 90 (bottom number)  . Stomach pain under your ribs in the middle or right side . Headaches or seeing spots . Feeling nauseated or throwing up . Swelling in face and hands  Zone 3: EMERGENCY  Seek immediate medical care if you have any of the following:  . BP reading is greater than160 (top number) or greater than 110 (bottom number) . Severe headaches not improving with Tylenol . Serious difficulty catching your breath . Any worsening symptoms from Zone 2

## 2021-03-08 NOTE — Progress Notes (Signed)
   LOW-RISK PREGNANCY VISIT Patient name: Aimee Burns MRN [id number]  Date of birth: Jul 05, 1991 Chief Complaint:   Routine Prenatal Visit  History of Present Illness:   Aimee Burns is a 30 y.o. G27P2002 female at [redacted]w[redacted]d with an Estimated Date of Delivery: 04/20/21 being seen today for ongoing management of a low-risk pregnancy.  Today she reports heartburn. Contractions: Not present. Vag. Bleeding: None.  Movement: Present. denies leaking of fluid. Review of Systems:   Pertinent items are noted in HPI Denies abnormal vaginal discharge w/ itching/odor/irritation, headaches, visual changes, shortness of breath, chest pain, abdominal pain, severe nausea/vomiting, or problems with urination or bowel movements unless otherwise stated above. Pertinent History Reviewed:  Reviewed past medical,surgical, social, obstetrical and family history.  Reviewed problem list, medications and allergies. Physical Assessment:   Vitals:   03/08/21 1626  BP: 112/72  Pulse: 71  Weight: 179 lb (81.2 kg)  Body mass index is 28.04 kg/m.        Physical Examination:   General appearance: Well appearing, and in no distress  Mental status: Alert, oriented to person, place, and time  Skin: Warm & dry  Cardiovascular: Normal heart rate noted  Respiratory: Normal respiratory effort, no distress  Abdomen: Soft, gravid, nontender  Pelvic: Cervical exam deferred         Extremities: Edema: None  Fetal Status:     Movement: Present    Chaperone: n/a    No results found for this or any previous visit (from the past 24 hour(s)).  Assessment & Plan:  1) Low-risk pregnancy G3P2002 at [redacted]w[redacted]d with an Estimated Date of Delivery: 04/20/21   2) Heartburn, , may try TUMS   Meds: No orders of the defined types were placed in this encounter.  Labs/procedures today: none  Plan:  Continue routine obstetrical care  Next visit: prefers in person    Reviewed: Preterm labor symptoms and general  obstetric precautions including but not limited to vaginal bleeding, contractions, leaking of fluid and fetal movement were reviewed in detail with the patient.  All questions were answered. Has home bp cuff.. Check bp weekly, let us know if >140/90.   Follow-up: Return in about 2 weeks (around 03/22/2021) for LROB, efw.  Orders Placed This Encounter  Procedures  . US OB Follow Up   Christin Fudge DNP, CNM 03/08/2021 4:43 PM

## 2021-03-19 ENCOUNTER — Encounter: Payer: Self-pay | Admitting: Obstetrics & Gynecology

## 2021-03-19 ENCOUNTER — Ambulatory Visit (INDEPENDENT_AMBULATORY_CARE_PROVIDER_SITE_OTHER): Payer: 59 | Admitting: Obstetrics & Gynecology

## 2021-03-19 ENCOUNTER — Other Ambulatory Visit: Payer: Self-pay

## 2021-03-19 ENCOUNTER — Ambulatory Visit (INDEPENDENT_AMBULATORY_CARE_PROVIDER_SITE_OTHER): Payer: 59

## 2021-03-19 VITALS — BP 108/61 | HR 73 | Wt 178.0 lb

## 2021-03-19 DIAGNOSIS — Z3480 Encounter for supervision of other normal pregnancy, unspecified trimester: Secondary | ICD-10-CM

## 2021-03-19 DIAGNOSIS — Z3A35 35 weeks gestation of pregnancy: Secondary | ICD-10-CM | POA: Diagnosis not present

## 2021-03-19 DIAGNOSIS — Z8759 Personal history of other complications of pregnancy, childbirth and the puerperium: Secondary | ICD-10-CM | POA: Diagnosis not present

## 2021-03-19 NOTE — Progress Notes (Signed)
   LOW-RISK PREGNANCY VISIT Patient name: Aimee Burns MRN [id number]  Date of birth: June 23, 1991 Chief Complaint:   Routine Prenatal Visit ([place] today)  History of Present Illness:   Aimee Burns is a 30 y.o. G20P2002 female at [redacted]w[redacted]d with an Estimated Date of Delivery: 04/20/21 being seen today for ongoing management of a low-risk pregnancy.  Depression screen [facility] 2/9 10/13/2020  Decreased Interest 0  Down, Depressed, Hopeless 0  PHQ - 2 Score 0  Altered sleeping 0  Tired, decreased energy 0  Change in appetite 0  Feeling bad or failure about yourself  0  Trouble concentrating 0  Moving slowly or fidgety/restless 0  Suicidal thoughts 0  PHQ-9 Score 0    Today she reports no complaints. Contractions: Not present. Vag. Bleeding: None.  Movement: Present. denies leaking of fluid. Review of Systems:   Pertinent items are noted in HPI Denies abnormal vaginal discharge w/ itching/odor/irritation, headaches, visual changes, shortness of breath, chest pain, abdominal pain, severe nausea/vomiting, or problems with urination or bowel movements unless otherwise stated above. Pertinent History Reviewed:  Reviewed past medical,surgical, social, obstetrical and family history.  Reviewed problem list, medications and allergies. Physical Assessment:   Vitals:   03/19/21 1137  BP: 108/61  Pulse: 73  Weight: 178 lb (80.7 kg)  Body mass index is 27.88 kg/m.        Physical Examination:   General appearance: Well appearing, and in no distress  Mental status: Alert, oriented to person, place, and time  Skin: Warm & dry  Cardiovascular: Normal heart rate noted  Respiratory: Normal respiratory effort, no distress  Abdomen: Soft, gravid, nontender  Pelvic: Cervical exam deferred         Extremities: Edema: None  Fetal Status:     Movement: Present    Chaperone: n/a    No results found for this or any previous visit (from the past 24 hour(s)).  Assessment & Plan:  1)  Low-risk pregnancy G3P2002 at [redacted]w[redacted]d with an Estimated Date of Delivery: 04/20/21   2) Hx FGR, 40% today   Meds: No orders of the defined types were placed in this encounter.  Labs/procedures today: sonogram  Plan:  Continue routine obstetrical care  Next visit: prefers in person    Reviewed: Term labor symptoms and general obstetric precautions including but not limited to vaginal bleeding, contractions, leaking of fluid and fetal movement were reviewed in detail with the patient.  All questions were answered. Has home bp cuff. Rx faxed to . Check bp weekly, let us know if >140/90.   Follow-up: Return in about 2 weeks (around 04/02/2021) for LROB.  No orders of the defined types were placed in this encounter.   Florian Buff, MD 03/19/2021 11:52 AM

## 2021-03-19 NOTE — Progress Notes (Signed)
[place] 25+4 wks,cephalic,anterior placenta gr 0,afi 16 cm,fhr 124 bpm,EFW 2601 g 40%

## 2021-04-03 ENCOUNTER — Encounter: Payer: Self-pay | Admitting: Women's Health

## 2021-04-03 ENCOUNTER — Other Ambulatory Visit: Payer: Self-pay

## 2021-04-03 ENCOUNTER — Ambulatory Visit (INDEPENDENT_AMBULATORY_CARE_PROVIDER_SITE_OTHER): Payer: 59 | Admitting: Women's Health

## 2021-04-03 ENCOUNTER — Other Ambulatory Visit (HOSPITAL_COMMUNITY)
Admission: RE | Admit: 2021-04-03 | Discharge: 2021-04-03 | Disposition: A | Discharge status: Home / Self Care | Payer: 59 | Source: Ambulatory Visit | Attending: Obstetrics & Gynecology | Admitting: Obstetrics & Gynecology

## 2021-04-03 VITALS — BP 128/70 | HR 93 | Wt 179.6 lb

## 2021-04-03 DIAGNOSIS — Z3A37 37 weeks gestation of pregnancy: Secondary | ICD-10-CM | POA: Diagnosis not present

## 2021-04-03 DIAGNOSIS — Z3483 Encounter for supervision of other normal pregnancy, third trimester: Secondary | ICD-10-CM | POA: Diagnosis present

## 2021-04-03 NOTE — Progress Notes (Signed)
   LOW-RISK PREGNANCY VISIT Patient name: Aimee Burns MRN [id number]  Date of birth: September 19, 1991 Chief Complaint:   Routine Prenatal Visit  History of Present Illness:   Aimee Burns is a 30 y.o. G48P2002 female at [redacted]w[redacted]d with an Estimated Date of Delivery: 04/20/21 being seen today for ongoing management of a low-risk pregnancy.  Depression screen [facility] 2/9 10/13/2020  Decreased Interest 0  Down, Depressed, Hopeless 0  PHQ - 2 Score 0  Altered sleeping 0  Tired, decreased energy 0  Change in appetite 0  Feeling bad or failure about yourself  0  Trouble concentrating 0  Moving slowly or fidgety/restless 0  Suicidal thoughts 0  PHQ-9 Score 0    Today she reports no complaints. Contractions: Not present. Vag. Bleeding: None.  Movement: Present. denies leaking of fluid. Review of Systems:   Pertinent items are noted in HPI Denies abnormal vaginal discharge w/ itching/odor/irritation, headaches, visual changes, shortness of breath, chest pain, abdominal pain, severe nausea/vomiting, or problems with urination or bowel movements unless otherwise stated above. Pertinent History Reviewed:  Reviewed past medical,surgical, social, obstetrical and family history.  Reviewed problem list, medications and allergies. Physical Assessment:   Vitals:   04/03/21 1134  BP: 128/70  Pulse: 93  Weight: 179 lb 9.6 oz (81.5 kg)  Body mass index is 28.13 kg/m.        Physical Examination:   General appearance: Well appearing, and in no distress  Mental status: Alert, oriented to person, place, and time  Skin: Warm & dry  Cardiovascular: Normal heart rate noted  Respiratory: Normal respiratory effort, no distress  Abdomen: Soft, gravid, nontender  Pelvic: inclusion cyst Rt labia majora w/ head, discussed w/ pt, she wants me to try to drain contents. Cyst squeezed and mod amt thick yellow nonodorous drainage came out. Cyst area now flat   Cultures obtained, SVE performed Dilation:  Closed Effacement (%): Thick Station: Ballotable, outer os 2-3cm  Extremities: Edema: Trace  Fetal Status: Fetal Heart Rate (bpm): 145 Fundal Height: 36 cm Movement: Present Presentation: Vertex  Chaperone: Angel Neas   No results found for this or any previous visit (from the past 24 hour(s)).  Assessment & Plan:  1) Low-risk pregnancy G3P2002 at [redacted]w[redacted]d with an Estimated Date of Delivery: 04/20/21   2) H/O FGR, EFW 40% @ 35wks  3) Labial inclusion cyst> contents expressed    Meds: No orders of the defined types were placed in this encounter.  Labs/procedures today: GBS, GC/CT and SVE  Plan:  Continue routine obstetrical care  Next visit: prefers in person    Reviewed: Term labor symptoms and general obstetric precautions including but not limited to vaginal bleeding, contractions, leaking of fluid and fetal movement were reviewed in detail with the patient.  All questions were answered.   Follow-up: Return in about 1 week (around 04/10/2021) for LROB, CNM, in person.  Future Appointments  Date Time Provider [place]  04/11/2021  3:50 PM Roma Schanz, CNM [facility]    Orders Placed This Encounter  Procedures  . Strep Gp B NAA+Rflx   [place], [facility] 04/03/2021 12:05 PM

## 2021-04-03 NOTE — Patient Instructions (Signed)
Blen Lattrell Rickerson, I greatly value your feedback.  If you receive a survey following your visit with us today, we appreciate you taking the time to fill it out.  Thanks, Kim Edwardine Deschepper, CNM, WHNP-BC  [facility] ([address] [place], [place] [address]) Entrance C, located off of [address] Free 24/7 valet parking   Go to [email or web address] to register for FREE online childbirth classes    Call the office ([phone]) or go to [facility] if:  You begin to have strong, frequent contractions  Your water breaks.  Sometimes it is a big gush of fluid, sometimes it is just a trickle that keeps getting your panties wet or running down your legs  You have vaginal bleeding.  It is normal to have a small amount of spotting if your cervix was checked.   You don't feel your baby moving like normal.  If you don't, get you something to eat and drink and lay down and focus on feeling your baby move.  You should feel at least 10 movements in 2 hours.  If you don't, you should call the office or go to [facility].   Call the office ([phone]) or go to [facility] for these signs of pre-eclampsia:  Severe headache that does not go away with Tylenol  Visual changes- seeing spots, double, blurred vision  Pain under your right breast or upper abdomen that does not go away with Tums or heartburn medicine  Nausea and/or vomiting  Severe swelling in your hands, feet, and face    Home Blood Pressure Monitoring for Patients   Your provider has recommended that you check your blood pressure (BP) at least once a week at home. If you do not have a blood pressure cuff at home, one will be provided for you. Contact your provider if you have not received your monitor within 1 week.   Helpful Tips for Accurate Home Blood Pressure Checks  . Don't smoke, exercise, or drink caffeine 30 minutes before checking your BP . Use the restroom before checking your BP (a  full bladder can raise your pressure) . Relax in a comfortable upright chair . Feet on the ground . Left arm resting comfortably on a flat surface at the level of your heart . Legs uncrossed . Back supported . Sit quietly and don't talk . Place the cuff on your bare arm . Adjust snuggly, so that only two fingertips can fit between your skin and the top of the cuff . Check 2 readings separated by at least one minute . Keep a log of your BP readings . For a visual, please reference this diagram: http://ccnc.care/bpdiagram  Provider Name: [facility]     Phone: [phone]  Zone 1: ALL CLEAR  Continue to monitor your symptoms:  . BP reading is less than 140 (top number) or less than 90 (bottom number)  . No right upper stomach pain . No headaches or seeing spots . No feeling nauseated or throwing up . No swelling in face and hands  Zone 2: CAUTION Call your doctor's office for any of the following:  . BP reading is greater than 140 (top number) or greater than 90 (bottom number)  . Stomach pain under your ribs in the middle or right side . Headaches or seeing spots . Feeling nauseated or throwing up . Swelling in face and hands  Zone 3: EMERGENCY  Seek immediate medical care if you have any of   the following:  . BP reading is greater than160 (top number) or greater than 110 (bottom number) . Severe headaches not improving with Tylenol . Serious difficulty catching your breath . Any worsening symptoms from Zone 2   Braxton Hicks Contractions Contractions of the uterus can occur throughout pregnancy, but they are not always a sign that you are in labor. You may have practice contractions called Braxton Hicks contractions. These false labor contractions are sometimes confused with true labor. What are [place] contractions? Braxton Hicks contractions are tightening movements that occur in the muscles of the uterus before labor. Unlike true labor contractions, these  contractions do not result in opening (dilation) and thinning of the cervix. Toward the end of pregnancy (32-34 weeks), Braxton Hicks contractions can happen more often and may become stronger. These contractions are sometimes difficult to tell apart from true labor because they can be very uncomfortable. You should not feel embarrassed if you go to the hospital with false labor. Sometimes, the only way to tell if you are in true labor is for your health care provider to look for changes in the cervix. The health care provider will do a physical exam and may monitor your contractions. If you are not in true labor, the exam should show that your cervix is not dilating and your water has not broken. If there are no other health problems associated with your pregnancy, it is completely safe for you to be sent home with false labor. You may continue to have Braxton Hicks contractions until you go into true labor. How to tell the difference between true labor and false labor True labor  Contractions last 30-70 seconds.  Contractions become very regular.  Discomfort is usually felt in the top of the uterus, and it spreads to the lower abdomen and low back.  Contractions do not go away with walking.  Contractions usually become more intense and increase in frequency.  The cervix dilates and gets thinner. False labor  Contractions are usually shorter and not as strong as true labor contractions.  Contractions are usually irregular.  Contractions are often felt in the front of the lower abdomen and in the groin.  Contractions may go away when you walk around or change positions while lying down.  Contractions get weaker and are shorter-lasting as time goes on.  The cervix usually does not dilate or become thin. Follow these instructions at home:  1. Take over-the-counter and prescription medicines only as told by your health care provider. 2. Keep up with your usual exercises and follow other  instructions from your health care provider. 3. Eat and drink lightly if you think you are going into labor. 4. If Braxton Hicks contractions are making you uncomfortable: ? Change your position from lying down or resting to walking, or change from walking to resting. ? Sit and rest in a tub of warm water. ? Drink enough fluid to keep your urine pale yellow. Dehydration may cause these contractions. ? Do slow and deep breathing several times an hour. 5. Keep all follow-up prenatal visits as told by your health care provider. This is important. Contact a health care provider if:  You have a fever.  You have continuous pain in your abdomen. Get help right away if:  Your contractions become stronger, more regular, and closer together.  You have fluid leaking or gushing from your vagina.  You pass blood-tinged mucus (bloody show).  You have bleeding from your vagina.  You have low back  pain that you never had before.  You feel your baby's head pushing down and causing pelvic pressure.  Your baby is not moving inside you as much as it used to. Summary  Contractions that occur before labor are called Braxton Hicks contractions, false labor, or practice contractions.  Braxton Hicks contractions are usually shorter, weaker, farther apart, and less regular than true labor contractions. True labor contractions usually become progressively stronger and regular, and they become more frequent.  Manage discomfort from Braxton Hicks contractions by changing position, resting in a warm bath, drinking plenty of water, or practicing deep breathing. This information is not intended to replace advice given to you by your health care provider. Make sure you discuss any questions you have with your health care provider. Document Revised: 10/31/2017 Document Reviewed: 04/03/2017 Elsevier Patient Education  2020 Elsevier Inc.    

## 2021-04-04 LAB — CERVICOVAGINAL ANCILLARY ONLY
Chlamydia: NEGATIVE
Comment: NEGATIVE
Comment: NORMAL
Neisseria Gonorrhea: NEGATIVE

## 2021-04-05 LAB — STREP GP B NAA+RFLX: Strep Gp B NAA+Rflx: NEGATIVE

## 2021-04-10 ENCOUNTER — Ambulatory Visit (INDEPENDENT_AMBULATORY_CARE_PROVIDER_SITE_OTHER): Payer: 59 | Admitting: Women's Health

## 2021-04-10 ENCOUNTER — Other Ambulatory Visit: Payer: Self-pay

## 2021-04-10 ENCOUNTER — Encounter: Payer: Self-pay | Admitting: Women's Health

## 2021-04-10 VITALS — BP 117/69 | HR 72 | Wt 183.0 lb

## 2021-04-10 DIAGNOSIS — Z3480 Encounter for supervision of other normal pregnancy, unspecified trimester: Secondary | ICD-10-CM

## 2021-04-10 DIAGNOSIS — Z3483 Encounter for supervision of other normal pregnancy, third trimester: Secondary | ICD-10-CM

## 2021-04-10 DIAGNOSIS — Z8759 Personal history of other complications of pregnancy, childbirth and the puerperium: Secondary | ICD-10-CM

## 2021-04-10 NOTE — Patient Instructions (Signed)
Aimee Burns, I greatly value your feedback.  If you receive a survey following your visit with [place] today, we appreciate you taking the time to fill it out.  Thanks, Knute Neu, CNM, WHNP-BC  [facility] [place] [facility] ([place], [place] [address]) Entrance C, located off of [place] parking   Go to [email or web address] to register for FREE online childbirth classes    Call the office [phone]) or go to [facility] if:  You begin to have strong, frequent contractions  Your water breaks.  Sometimes it is a big gush of fluid, sometimes it is just a trickle that keeps getting your panties wet or running down your legs  You have vaginal bleeding.  It is normal to have a small amount of spotting if your cervix was checked.   You don't feel your baby moving like normal.  If you don't, get you something to eat and drink and lay down and focus on feeling your baby move.  You should feel at least 10 movements in 2 hours.  If you don't, you should call the office or go to [facility].   Call the office [phone]) or go to [facility] for these signs of pre-eclampsia:  Severe headache that does not go away with Tylenol  Visual changes- seeing spots, double, blurred vision  Pain under your right breast or upper abdomen that does not go away with Tums or heartburn medicine  Nausea and/or vomiting  Severe swelling in your hands, feet, and face    Home Blood Pressure Monitoring for Patients   Your provider has recommended that you check your blood pressure (BP) at least once a week at home. If you do not have a blood pressure cuff at home, one will be provided for you. Contact your provider if you have not received your monitor within 1 week.   Helpful Tips for Accurate Home Blood Pressure Checks  . Don't smoke, exercise, or drink caffeine 30 minutes before checking your BP . Use the restroom before checking your BP (a  full bladder can raise your pressure) . Relax in a comfortable upright chair . Feet on the ground . Left arm resting comfortably on a flat surface at the level of your heart . Legs uncrossed . Back supported . Sit quietly and don't talk . Place the cuff on your bare arm . Adjust snuggly, so that only two fingertips can fit between your skin and the top of the cuff . Check 2 readings separated by at least one minute . Keep a log of your BP readings . For a visual, please reference this diagram: http://ccnc.care/bpdiagram  Provider Name: [facility]     Phone: [phone]  Zone 1: ALL CLEAR  Continue to monitor your symptoms:  . BP reading is less than 140 (top number) or less than 90 (bottom number)  . No right upper stomach pain . No headaches or seeing spots . No feeling nauseated or throwing up . No swelling in face and hands  Zone 2: CAUTION Call your doctor's office for any of the following:  . BP reading is greater than 140 (top number) or greater than 90 (bottom number)  . Stomach pain under your ribs in the middle or right side . Headaches or seeing spots . Feeling nauseated or throwing up . Swelling in face and hands  Zone 3: EMERGENCY  Seek immediate medical care if you have any of  the following:  . BP reading is greater than160 (top number) or greater than 110 (bottom number) . Severe headaches not improving with Tylenol . Serious difficulty catching your breath . Any worsening symptoms from Zone 2   Braxton Hicks Contractions Contractions of the uterus can occur throughout pregnancy, but they are not always a sign that you are in labor. You may have practice contractions called Braxton Hicks contractions. These false labor contractions are sometimes confused with true labor. What are [place] contractions? Braxton Hicks contractions are tightening movements that occur in the muscles of the uterus before labor. Unlike true labor contractions, these  contractions do not result in opening (dilation) and thinning of the cervix. Toward the end of pregnancy (32-34 weeks), Braxton Hicks contractions can happen more often and may become stronger. These contractions are sometimes difficult to tell apart from true labor because they can be very uncomfortable. You should not feel embarrassed if you go to the hospital with false labor. Sometimes, the only way to tell if you are in true labor is for your health care provider to look for changes in the cervix. The health care provider will do a physical exam and may monitor your contractions. If you are not in true labor, the exam should show that your cervix is not dilating and your water has not broken. If there are no other health problems associated with your pregnancy, it is completely safe for you to be sent home with false labor. You may continue to have Braxton Hicks contractions until you go into true labor. How to tell the difference between true labor and false labor True labor  Contractions last 30-70 seconds.  Contractions become very regular.  Discomfort is usually felt in the top of the uterus, and it spreads to the lower abdomen and low back.  Contractions do not go away with walking.  Contractions usually become more intense and increase in frequency.  The cervix dilates and gets thinner. False labor  Contractions are usually shorter and not as strong as true labor contractions.  Contractions are usually irregular.  Contractions are often felt in the front of the lower abdomen and in the groin.  Contractions may go away when you walk around or change positions while lying down.  Contractions get weaker and are shorter-lasting as time goes on.  The cervix usually does not dilate or become thin. Follow these instructions at home:  1. Take over-the-counter and prescription medicines only as told by your health care provider. 2. Keep up with your usual exercises and follow other  instructions from your health care provider. 3. Eat and drink lightly if you think you are going into labor. 4. If Braxton Hicks contractions are making you uncomfortable: ? Change your position from lying down or resting to walking, or change from walking to resting. ? Sit and rest in a tub of warm water. ? Drink enough fluid to keep your urine pale yellow. Dehydration may cause these contractions. ? Do slow and deep breathing several times an hour. 5. Keep all follow-up prenatal visits as told by your health care provider. This is important. Contact a health care provider if:  You have a fever.  You have continuous pain in your abdomen. Get help right away if:  Your contractions become stronger, more regular, and closer together.  You have fluid leaking or gushing from your vagina.  You pass blood-tinged mucus (bloody show).  You have bleeding from your vagina.  You have low back  pain that you never had before.  You feel your baby's head pushing down and causing pelvic pressure.  Your baby is not moving inside you as much as it used to. Summary  Contractions that occur before labor are called Braxton Hicks contractions, false labor, or practice contractions.  Braxton Hicks contractions are usually shorter, weaker, farther apart, and less regular than true labor contractions. True labor contractions usually become progressively stronger and regular, and they become more frequent.  Manage discomfort from [facility] contractions by changing position, resting in a warm bath, drinking plenty of water, or practicing deep breathing. This information is not intended to replace advice given to you by your health care provider. Make sure you discuss any questions you have with your health care provider. Document Revised: 10/31/2017 Document Reviewed: 04/03/2017 Elsevier Patient Education  [place].

## 2021-04-10 NOTE — Progress Notes (Signed)
   LOW-RISK PREGNANCY VISIT Patient name: Aimee Burns MRN [id number]  Date of birth: [birth year] Chief Complaint:   Routine Prenatal Visit  History of Present Illness:   Aimee Burns is a 30 y.o. G80P2002 female at [redacted]w[redacted]d with an Estimated Date of Delivery: 04/20/21 being seen today for ongoing management of a low-risk pregnancy.  Depression screen [facility] 2/9 10/13/2020  Decreased Interest 0  Down, Depressed, Hopeless 0  PHQ - 2 Score 0  Altered sleeping 0  Tired, decreased energy 0  Change in appetite 0  Feeling bad or failure about yourself  0  Trouble concentrating 0  Moving slowly or fidgety/restless 0  Suicidal thoughts 0  PHQ-9 Score 0    Today she reports some mild cramping. Contractions: Not present. Vag. Bleeding: None.  Movement: Present. denies leaking of fluid. Review of Systems:   Pertinent items are noted in HPI Denies abnormal vaginal discharge w/ itching/odor/irritation, headaches, visual changes, shortness of breath, chest pain, abdominal pain, severe nausea/vomiting, or problems with urination or bowel movements unless otherwise stated above. Pertinent History Reviewed:  Reviewed past medical,surgical, social, obstetrical and family history.  Reviewed problem list, medications and allergies. Physical Assessment:   Vitals:   04/10/21 1600  BP: 117/69  Pulse: 72  Weight: 183 lb (83 kg)  Body mass index is 28.66 kg/m.        Physical Examination:   General appearance: Well appearing, and in no distress  Mental status: Alert, oriented to person, place, and time  Skin: Warm & dry  Cardiovascular: Normal heart rate noted  Respiratory: Normal respiratory effort, no distress  Abdomen: Soft, gravid, nontender  Pelvic: Cervical exam performed  Dilation: 3 Effacement (%): Thick Station: -3  Extremities: Edema: Trace  Fetal Status: Fetal Heart Rate (bpm): 140 Fundal Height: 37 cm Movement: Present Presentation: Vertex  Chaperone: Aimee Burns    No results found for this or any previous visit (from the past 24 hour(s)).  Assessment & Plan:  1) Low-risk pregnancy G3P2002 at [redacted]w[redacted]d with an Estimated Date of Delivery: 04/20/21   2) H/O FGR, EFW 40% @ 35wks   Meds: No orders of the defined types were placed in this encounter.  Labs/procedures today: SVE  Plan:  Continue routine obstetrical care  Next visit: prefers in person    Reviewed: Term labor symptoms and general obstetric precautions including but not limited to vaginal bleeding, contractions, leaking of fluid and fetal movement were reviewed in detail with the patient.  All questions were answered.   Follow-up: Return in about 1 week (around 04/17/2021) for LROB, CNM, in person.  No future appointments.  No orders of the defined types were placed in this encounter.  [place], [facility] 04/10/2021 4:19 PM

## 2021-04-11 ENCOUNTER — Encounter: Payer: 59 | Admitting: Women's Health

## 2021-04-17 ENCOUNTER — Other Ambulatory Visit: Payer: Self-pay

## 2021-04-17 ENCOUNTER — Inpatient Hospital Stay (HOSPITAL_COMMUNITY)
Admission: AD | Admit: 2021-04-17 | Discharge: 2021-04-18 | DRG: 807 | Disposition: A | Discharge status: Home / Self Care | Payer: 59 | Attending: Obstetrics and Gynecology | Admitting: Obstetrics and Gynecology

## 2021-04-17 ENCOUNTER — Inpatient Hospital Stay (HOSPITAL_COMMUNITY): Payer: 59 | Admitting: Anesthesiology

## 2021-04-17 ENCOUNTER — Encounter (HOSPITAL_COMMUNITY): Payer: Self-pay | Admitting: Obstetrics & Gynecology

## 2021-04-17 DIAGNOSIS — Z3480 Encounter for supervision of other normal pregnancy, unspecified trimester: Secondary | ICD-10-CM

## 2021-04-17 DIAGNOSIS — Z6791 Unspecified blood type, Rh negative: Secondary | ICD-10-CM

## 2021-04-17 DIAGNOSIS — O26893 Other specified pregnancy related conditions, third trimester: Principal | ICD-10-CM | POA: Diagnosis present

## 2021-04-17 DIAGNOSIS — Z3A39 39 weeks gestation of pregnancy: Secondary | ICD-10-CM

## 2021-04-17 DIAGNOSIS — O26899 Other specified pregnancy related conditions, unspecified trimester: Secondary | ICD-10-CM

## 2021-04-17 DIAGNOSIS — O4202 Full-term premature rupture of membranes, onset of labor within 24 hours of rupture: Secondary | ICD-10-CM

## 2021-04-17 DIAGNOSIS — Z20822 Contact with and (suspected) exposure to covid-19: Secondary | ICD-10-CM | POA: Diagnosis present

## 2021-04-17 DIAGNOSIS — Z8616 Personal history of COVID-19: Secondary | ICD-10-CM

## 2021-04-17 LAB — TYPE AND SCREEN
ABO/RH(D): A NEG
Antibody Screen: POSITIVE

## 2021-04-17 LAB — CBC
HCT: 35.7 % — ABNORMAL LOW (ref 36.0–46.0)
Hemoglobin: 11.9 g/dL — ABNORMAL LOW (ref 12.0–15.0)
MCH: 30.7 pg (ref 26.0–34.0)
MCHC: 33.3 g/dL (ref 30.0–36.0)
MCV: 92 fL (ref 80.0–100.0)
Platelets: 281 10*3/uL (ref 150–400)
RBC: 3.88 MIL/uL (ref 3.87–5.11)
RDW: 13.4 % (ref 11.5–15.5)
WBC: 12.8 10*3/uL — ABNORMAL HIGH (ref 4.0–10.5)
nRBC: 0 % (ref 0.0–0.2)

## 2021-04-17 LAB — RPR: RPR Ser Ql: NONREACTIVE

## 2021-04-17 LAB — RESP PANEL BY RT-PCR (FLU A&B, COVID) ARPGX2
Influenza A by PCR: NEGATIVE
Influenza B by PCR: NEGATIVE
SARS Coronavirus 2 by RT PCR: NEGATIVE

## 2021-04-17 MED ORDER — DIPHENHYDRAMINE HCL 50 MG/ML IJ SOLN: 12.5000 mg | INTRAMUSCULAR | Status: DC | PRN

## 2021-04-17 MED ORDER — MEASLES, MUMPS & RUBELLA VAC IJ SOLR: 0.5000 mL | Freq: Once | INTRAMUSCULAR | Status: DC

## 2021-04-17 MED ORDER — IBUPROFEN 600 MG PO TABS
600.0000 mg | ORAL_TABLET | Freq: Four times a day (QID) | ORAL | Status: DC
  Administered 2021-04-17 – 2021-04-18 (×5): 600 mg via ORAL
  Filled 2021-04-17 (×5): qty 1

## 2021-04-17 MED ORDER — ACETAMINOPHEN 325 MG PO TABS: 650.0000 mg | ORAL_TABLET | ORAL | Status: DC | PRN

## 2021-04-17 MED ORDER — OXYTOCIN BOLUS FROM INFUSION: 333.0000 mL | Freq: Once | INTRAVENOUS | Status: DC

## 2021-04-17 MED ORDER — SIMETHICONE 80 MG PO CHEW: 80.0000 mg | CHEWABLE_TABLET | ORAL | Status: DC | PRN

## 2021-04-17 MED ORDER — LACTATED RINGERS IV SOLN: INTRAVENOUS | Status: DC

## 2021-04-17 MED ORDER — COCONUT OIL OIL: 1.0000 "application " | TOPICAL_OIL | Status: DC | PRN

## 2021-04-17 MED ORDER — LIDOCAINE-EPINEPHRINE (PF) 2 %-1:200000 IJ SOLN
INTRAMUSCULAR | Status: DC | PRN
  Administered 2021-04-17: 5 mL via EPIDURAL

## 2021-04-17 MED ORDER — EPHEDRINE 5 MG/ML INJ: 10.0000 mg | INTRAVENOUS | Status: DC | PRN

## 2021-04-17 MED ORDER — FENTANYL-BUPIVACAINE-NACL 0.5-0.125-0.9 MG/250ML-% EP SOLN
12.0000 mL/h | EPIDURAL | Status: DC | PRN
Start: 2021-04-17 — End: 2021-04-17
  Administered 2021-04-17: 12 mL/h via EPIDURAL
  Filled 2021-04-17: qty 250

## 2021-04-17 MED ORDER — OXYCODONE-ACETAMINOPHEN 5-325 MG PO TABS: 1.0000 | ORAL_TABLET | ORAL | Status: DC | PRN

## 2021-04-17 MED ORDER — PRENATAL MULTIVITAMIN CH
1.0000 | ORAL_TABLET | Freq: Every day | ORAL | Status: DC
  Administered 2021-04-17 – 2021-04-18 (×2): 1 via ORAL
  Filled 2021-04-17 (×2): qty 1

## 2021-04-17 MED ORDER — PHENYLEPHRINE 40 MCG/ML (10ML) SYRINGE FOR IV PUSH (FOR BLOOD PRESSURE SUPPORT)
80.0000 ug | PREFILLED_SYRINGE | INTRAVENOUS | Status: DC | PRN
  Filled 2021-04-17: qty 10

## 2021-04-17 MED ORDER — ONDANSETRON HCL 4 MG/2ML IJ SOLN: 4.0000 mg | Freq: Four times a day (QID) | INTRAMUSCULAR | Status: DC | PRN

## 2021-04-17 MED ORDER — PHENYLEPHRINE 40 MCG/ML (10ML) SYRINGE FOR IV PUSH (FOR BLOOD PRESSURE SUPPORT): 80.0000 ug | PREFILLED_SYRINGE | INTRAVENOUS | Status: DC | PRN

## 2021-04-17 MED ORDER — ONDANSETRON HCL 4 MG/2ML IJ SOLN: 4.0000 mg | INTRAMUSCULAR | Status: DC | PRN

## 2021-04-17 MED ORDER — WITCH HAZEL-GLYCERIN EX PADS: 1.0000 "application " | MEDICATED_PAD | CUTANEOUS | Status: DC | PRN

## 2021-04-17 MED ORDER — LACTATED RINGERS IV SOLN: 500.0000 mL | Freq: Once | INTRAVENOUS | Status: DC

## 2021-04-17 MED ORDER — SOD CITRATE-CITRIC ACID 500-334 MG/5ML PO SOLN: 30.0000 mL | ORAL | Status: DC | PRN

## 2021-04-17 MED ORDER — SENNOSIDES-DOCUSATE SODIUM 8.6-50 MG PO TABS
2.0000 | ORAL_TABLET | ORAL | Status: DC
  Administered 2021-04-17 – 2021-04-18 (×2): 2 via ORAL
  Filled 2021-04-17 (×2): qty 2

## 2021-04-17 MED ORDER — FENTANYL CITRATE (PF) 100 MCG/2ML IJ SOLN: 100.0000 ug | INTRAMUSCULAR | Status: DC | PRN

## 2021-04-17 MED ORDER — OXYCODONE-ACETAMINOPHEN 5-325 MG PO TABS: 2.0000 | ORAL_TABLET | ORAL | Status: DC | PRN

## 2021-04-17 MED ORDER — DIBUCAINE (PERIANAL) 1 % EX OINT: 1.0000 "application " | TOPICAL_OINTMENT | CUTANEOUS | Status: DC | PRN

## 2021-04-17 MED ORDER — TRANEXAMIC ACID-NACL 1000-0.7 MG/100ML-% IV SOLN: 1000.0000 mg | Freq: Once | INTRAVENOUS | Status: DC

## 2021-04-17 MED ORDER — DIPHENHYDRAMINE HCL 25 MG PO CAPS: 25.0000 mg | ORAL_CAPSULE | Freq: Four times a day (QID) | ORAL | Status: DC | PRN

## 2021-04-17 MED ORDER — LIDOCAINE HCL (PF) 1 % IJ SOLN: 30.0000 mL | INTRAMUSCULAR | Status: DC | PRN

## 2021-04-17 MED ORDER — TETANUS-DIPHTH-ACELL PERTUSSIS 5-2.5-18.5 LF-MCG/0.5 IM SUSY
0.5000 mL | PREFILLED_SYRINGE | Freq: Once | INTRAMUSCULAR | Status: DC
Start: 2021-04-18 — End: 2021-04-18

## 2021-04-17 MED ORDER — BENZOCAINE-MENTHOL 20-0.5 % EX AERO: 1.0000 "application " | INHALATION_SPRAY | CUTANEOUS | Status: DC | PRN

## 2021-04-17 MED ORDER — ZOLPIDEM TARTRATE 5 MG PO TABS: 5.0000 mg | ORAL_TABLET | Freq: Every evening | ORAL | Status: DC | PRN

## 2021-04-17 MED ORDER — OXYTOCIN-SODIUM CHLORIDE 30-0.9 UT/500ML-% IV SOLN
2.5000 [IU]/h | INTRAVENOUS | Status: DC
  Filled 2021-04-17: qty 500

## 2021-04-17 MED ORDER — ONDANSETRON HCL 4 MG PO TABS: 4.0000 mg | ORAL_TABLET | ORAL | Status: DC | PRN

## 2021-04-17 MED ORDER — LACTATED RINGERS IV SOLN: 500.0000 mL | INTRAVENOUS | Status: DC | PRN

## 2021-04-17 MED ORDER — TRANEXAMIC ACID-NACL 1000-0.7 MG/100ML-% IV SOLN
INTRAVENOUS | Status: AC
  Filled 2021-04-17: qty 100

## 2021-04-17 NOTE — Discharge Summary (Signed)
Postpartum Discharge Summary  Date of Service updated     Patient Name: Aimee Burns DOB: [birth year] MRN: [id number]  Date of admission: 04/17/2021 Delivery date:04/17/2021  Delivering provider: Nicolette Bang  Date of discharge: 04/23/2021  Admitting diagnosis: Indication for care in labor or delivery [O75.9] Intrauterine pregnancy: [redacted]w[redacted]d    Secondary diagnosis:  Active Problems:   Indication for care in labor or delivery  Additional problems: none    Discharge diagnosis: Term Pregnancy Delivered                                              Post partum procedures:rhogam if indicated Augmentation: N/A Complications: None  Hospital course: Onset of Labor With Vaginal Delivery      30y.o. yo [id number] admitted in Active Labor on 04/17/2021. Patient had an uncomplicated labor course as follows:  Membrane Rupture Time/Date: 8:26 AM ,04/17/2021   Delivery Method:Vaginal, Spontaneous  Episiotomy: None  Lacerations:  None  Patient had an uncomplicated postpartum course.  She is ambulating, tolerating a regular diet, passing flatus, and urinating well. Patient is discharged home in stable condition on 04/23/21.  Newborn Data: Birth date:04/17/2021  Birth time:9:55 AM  Gender:Female  Living status:Living  Apgars:9 ,10  Weight:3805 g   Magnesium Sulfate received: No BMZ received: No Rhophylac:Yes MMR:No T-DaP:Given prenatally Flu: No Transfusion:No  Physical exam  Vitals:   04/17/21 1704 04/17/21 2105 04/18/21 0100 04/18/21 0606  BP: 116/65 114/62 (!) 107/54 (!) 105/56  Pulse: 82 80 76 74  Resp: 18 18 18 18   Temp: 98.2 F (36.8 C)  98.2 F (36.8 C) 98.1 F (36.7 C)  TempSrc: Oral  Oral Oral  SpO2: 99% 99% 100% 100%  Weight:      Height:       General: alert, cooperative and no distress Lochia: appropriate Uterine Fundus: firm Incision: N/A DVT Evaluation: No evidence of DVT seen on physical exam. Negative Homan's sign. No  cords or calf tenderness. No significant calf/ankle edema. Labs: Lab Results  Component Value Date   WBC 12.8 (H) 04/17/2021   HGB 11.9 (L) 04/17/2021   HCT 35.7 (L) 04/17/2021   MCV 92.0 04/17/2021   PLT 281 04/17/2021   CMP Latest Ref Rng & Units 09/24/2012  Glucose 70 - 99 mg/dL 85  BUN 6 - 23 mg/dL 7  Creatinine 0.50 - 1.10 mg/dL 0.52  Sodium 135 - 145 mEq/L 137  Potassium 3.5 - 5.1 mEq/L 3.6  Chloride 96 - 112 mEq/L 104  CO2 19 - 32 mEq/L 23  Calcium 8.4 - 10.5 mg/dL 9.8   Edinburgh Score: Edinburgh Postnatal Depression Scale Screening Tool 04/17/2021  I have been able to laugh and see the funny side of things. 0  I have looked forward with enjoyment to things. 0  I have blamed myself unnecessarily when things went wrong. 0  I have been anxious or worried for no good reason. 0  I have felt scared or panicky for no good reason. 0  Things have been getting on top of me. 0  I have been so unhappy that I have had difficulty sleeping. 0  I have felt sad or miserable. 0  I have been so unhappy that I have been crying. 0  The thought of harming myself has occurred to me. 0  Edinburgh Postnatal Depression  Scale Total 0     After visit meds:  Allergies as of 04/18/2021      Reactions   Amoxicillin Rash   Has patient had a PCN reaction causing immediate rash, facial/tongue/throat swelling, SOB or lightheadedness with hypotension: Yes Has patient had a PCN reaction causing severe rash involving mucus membranes or skin necrosis: No Has patient had a PCN reaction that required hospitalization No Has patient had a PCN reaction occurring within the last 10 years: Yes If all of the above answers are "NO", then may proceed with Cephalosporin use.      Medication List    STOP taking these medications   aspirin EC 81 MG tablet   COLACE PO     TAKE these medications   CitraNatal Assure 35-1 & 300 MG tablet One tablet and one capsule daily   ibuprofen 600 MG tablet Commonly  known as: ADVIL Take 1 tablet (600 mg total) by mouth every 6 (six) hours as needed for mild pain, moderate pain or cramping.        Discharge home in stable condition Infant Feeding: Breast Infant Disposition:home with mother Discharge instruction: per After Visit Summary and Postpartum booklet. Activity: Advance as tolerated. Pelvic rest for 6 weeks.  Diet: routine diet Future Appointments: Future Appointments  Date Time Provider [place]  05/23/2021  1:50 PM Roma Schanz, CNM [facility]   Follow up Visit:  Follow-up Information    [facility]. Call.   Specialty: Obstetrics and Gynecology Why: to schedule a postpartum visit for 4-6 weeks Contact information: [address] [place] [id number]               Please schedule this patient for a Virtual postpartum visit in 4 weeks with the following provider: Any provider. Additional Postpartum F/U:none Low risk pregnancy complicated by: h/o FGR, Rh neg  Delivery mode:  Vaginal, Spontaneous  Anticipated Birth Control:  OCPs   04/23/2021 Roma Schanz, CNM

## 2021-04-17 NOTE — H&P (Signed)
Aimee Burns is a 30 y.o. female at [redacted]w[redacted]d presenting for spontaneous onset of labor. History of Covid+ on 12/11/20. Hx FGR, 40%ile at 35wk u/s.  OB History    Gravida  3   Para  2   Term  2   Preterm      AB      Living  2     SAB      IAB      Ectopic      Multiple  0   Live Births  2          Past Medical History:  Diagnosis Date  . Nexplanon insertion 07/08/2013   Placed in left arm 07/08/13 remove 07/08/16  . Pregnancy   . Renal calculi    2011,right parathyroidectomy  . Thyroid tumor, benign    Past Surgical History:  Procedure Laterality Date  . Parathyroid gland removed    . PARATHYROIDECTOMY / EXPLORATION OF PARATHYROIDS Right 2011   [place], at [facility]  . THYROIDECTOMY, PARTIAL Right 2011   Family History: family history includes Cancer in her paternal grandmother; Diabetes in her paternal grandmother; Thyroid disease in her mother. Social History:  reports that she has never smoked. She has never used smokeless tobacco. She reports previous alcohol use. She reports that she does not use drugs.     Maternal Diabetes: No Genetic Screening: Normal Maternal Ultrasounds/Referrals: Normal Fetal Ultrasounds or other Referrals:  None Maternal Substance Abuse:  No Significant Maternal Medications:  None Significant Maternal Lab Results:  Group B Strep negative and Rh negative Other Comments:  None  Review of Systems Pertinent items noted in HPI and remainder of comprehensive ROS otherwise negative.  Maternal Medical History:  Reason for admission: Contractions.   Contractions: Onset was 3-5 hours ago.   Frequency: regular.   Duration is approximately 1 minute.   Perceived severity is strong.    Fetal activity: Perceived fetal activity is normal.   Last perceived fetal movement was within the past hour.    Prenatal Complications - Diabetes: none.      Blood pressure 138/71, pulse 92, temperature 98.2 F (36.8 C), resp. rate 18, weight  183 lb (83 kg), last menstrual period 07/14/2020. Maternal Exam:  Uterine Assessment: Contraction strength is firm.  Contraction frequency is regular.   Abdomen: Patient reports no abdominal tenderness. Fetal presentation: vertex  Introitus: Normal vulva. Normal vagina.  Ferning test: not done.   Pelvis: adequate for delivery.   Cervix: Cervix evaluated by digital exam.     Fetal Exam Fetal Monitor Review: Mode: ultrasound.   Baseline rate: 135.  Variability: moderate (6-25 bpm).   Pattern: accelerations present.    Fetal State Assessment: Category I - tracings are normal.     Physical Exam Vitals and nursing note reviewed.  Constitutional:      Appearance: She is not ill-appearing.  HENT:     Head: Normocephalic and atraumatic.     Mouth/Throat:     Mouth: Mucous membranes are moist.  Eyes:     Pupils: Pupils are equal, round, and reactive to light.  Cardiovascular:     Rate and Rhythm: Normal rate.  Pulmonary:     Effort: Pulmonary effort is normal.  Abdominal:     General: There is no distension.     Palpations: Abdomen is soft.     Tenderness: There is no abdominal tenderness.     Comments: gravid  Genitourinary:    General: Normal vulva.  Musculoskeletal:  General: Normal range of motion.  Skin:    General: Skin is warm and dry.     Capillary Refill: Capillary refill takes less than 2 seconds.  Neurological:     Mental Status: She is alert and oriented to person, place, and time.  Psychiatric:        Mood and Affect: Mood normal.        Behavior: Behavior normal.        Thought Content: Thought content normal.        Judgment: Judgment normal.     Prenatal labs: ABO, Rh: A/Negative/-- (11/12 1103) Antibody: Negative (02/22 0827) Rubella: 9.81 (11/12 1103) RPR: Non Reactive (02/22 0827)  HBsAg: Negative (11/12 1103)  HIV: Non Reactive (02/22 0827)  GBS: --Henderson Cloud (05/03 1456)   Assessment/Plan: Admit to L &D for expectant management, may  have epidural on request GBS negative A negative (rhogam given 02/21/21)  Gabriel Carina 04/17/2021, 7:26 AM

## 2021-04-17 NOTE — Anesthesia Procedure Notes (Signed)

## 2021-04-17 NOTE — Lactation Note (Signed)
This note was copied from a baby's chart. Lactation Consultation Note Baby 12 hrs old at time of consult. Late order received to see mom. Mom's 3rd child. Mom wanted to see Lactation d/t she didn't Bf her 1st child now 28 yrs old and 2nd child now 36 yrs old only BF for 2 days. Mom stated she wants to BF this baby. Mom has to return to work 4-6 weeks. Mom has a DEBP arriving tomorrow at home. Mom stated this baby is latching well. Baby sleeping soundly when [place] arrived mom stated baby BF 15 minutes prior to [facility] coming.  Newborn feeding habits, STS, I&O, breast massage, positioning, support, nipple care, pumping, building storage, supply and demand discussed. Mom encouraged to feed baby 8-12 times/24 hours and with feeding cues.   Mom has cone shaped breast w/short shaft evert nipples at rest, everts well w/stimulation. Hand expression demonstrated w/easily expressed colostrum noted. Mom excited.  Asked mom to call for LC to come for next feeding to assist in latching and feeding tips. Lactation brochure given.  Patient Name: Aimee Burns [id number] Date: 04/17/2021 Reason for consult: Initial assessment;Term Age:2 hours  Maternal Data Has patient been taught Hand Expression?: Yes Does the patient have breastfeeding experience prior to this delivery?: Yes How long did the patient breastfeed?: only 2 days  Feeding    LATCH Score       Type of Nipple: Everted at rest and after stimulation (short shaft at rest)  Comfort (Breast/Nipple): Soft / non-tender         Lactation Tools Discussed/Used    Interventions Interventions: Breast feeding basics reviewed;Position options;Skin to skin  Discharge [place] Program: No  Consult Status Consult Status: Follow-up Date: 04/18/21 Follow-up type: In-patient    Theodoro Kalata 04/17/2021, 11:11 PM

## 2021-04-17 NOTE — Anesthesia Preprocedure Evaluation (Signed)
Anesthesia Evaluation  Patient identified by MRN, date of birth, ID band Patient awake    Reviewed: Allergy & Precautions, NPO status , Patient's Chart, lab work & pertinent test results  Airway Mallampati: II  TM Distance: >3 FB Neck ROM: Full    Dental no notable dental hx.    Pulmonary neg pulmonary ROS,    Pulmonary exam normal breath sounds clear to auscultation       Cardiovascular negative cardio ROS Normal cardiovascular exam Rhythm:Regular Rate:Normal     Neuro/Psych PSYCHIATRIC DISORDERS Anxiety negative neurological ROS     GI/Hepatic negative GI ROS, Neg liver ROS,   Endo/Other  Hypothyroidism   Renal/GU negative Renal ROS  negative genitourinary   Musculoskeletal negative musculoskeletal ROS (+)   Abdominal   Peds  Hematology negative hematology ROS (+)   Anesthesia Other Findings   Reproductive/Obstetrics (+) Pregnancy                             Anesthesia Physical Anesthesia Plan  ASA: II  Anesthesia Plan: Epidural   Post-op Pain Management:    Induction:   PONV Risk Score and Plan: Treatment may vary due to age or medical condition  Airway Management Planned: Natural Airway  Additional Equipment:   Intra-op Plan:   Post-operative Plan:   Informed Consent: I have reviewed the patients History and Physical, chart, labs and discussed the procedure including the risks, benefits and alternatives for the proposed anesthesia with the patient or authorized representative who has indicated his/her understanding and acceptance.       Plan Discussed with: Anesthesiologist  Anesthesia Plan Comments: (Patient identified. Risks, benefits, options discussed with patient including but not limited to bleeding, infection, nerve damage, paralysis, failed block, incomplete pain control, headache, blood pressure changes, nausea, vomiting, reactions to medication, itching,  and post partum back pain. Confirmed with bedside nurse the patient's most recent platelet count. Confirmed with the patient that they are not taking any anticoagulation, have any bleeding history or any family history of bleeding disorders. Patient expressed understanding and wishes to proceed. All questions were answered. )        Anesthesia Quick Evaluation

## 2021-04-17 NOTE — MAU Note (Incomplete)
Pt reports ctx since 3am.  Now 2 min apart. Reports some bloody show an mucus plug came out.

## 2021-04-18 ENCOUNTER — Encounter: Payer: 59 | Admitting: Advanced Practice Midwife

## 2021-04-18 DIAGNOSIS — Z3A39 39 weeks gestation of pregnancy: Secondary | ICD-10-CM

## 2021-04-18 LAB — BIRTH TISSUE RECOVERY COLLECTION (PLACENTA DONATION)

## 2021-04-18 MED ORDER — RHO D IMMUNE GLOBULIN 1500 UNIT/2ML IJ SOSY
300.0000 ug | PREFILLED_SYRINGE | Freq: Once | INTRAMUSCULAR | Status: AC
  Administered 2021-04-18: 300 ug via INTRAVENOUS
  Filled 2021-04-18: qty 2

## 2021-04-18 MED ORDER — IBUPROFEN 600 MG PO TABS: 600.0000 mg | ORAL_TABLET | Freq: Four times a day (QID) | ORAL | 0 refills | Status: DC | PRN

## 2021-04-18 NOTE — Progress Notes (Signed)
Post Partum Day 1 Subjective: Eating, drinking, voiding, ambulating well.  +flatus.  Lochia and pain wnl.  Denies dizziness, lightheadedness, or sob. No complaints.   Objective: Blood pressure (!) 105/56, pulse 74, temperature 98.1 F (36.7 C), temperature source Oral, resp. rate 18, height 5\' 8"  (1.727 m), weight 83 kg, last menstrual period 07/14/2020, SpO2 100 %, unknown if currently breastfeeding.  Physical Exam:  General: alert, cooperative and no distress Lochia: appropriate Uterine Fundus: firm Incision: n/a DVT Evaluation: No evidence of DVT seen on physical exam. Negative Homan's sign. No cords or calf tenderness. No significant calf/ankle edema.  Recent Labs    04/17/21 0741  HGB 11.9*  HCT 35.7*    Assessment/Plan: Plan for discharge tomorrow, Breastfeeding, Lactation consult, Circumcision prior to discharge and Contraception POPs   LOS: 1 day   Roma Schanz 04/18/2021, 8:03 AM

## 2021-04-18 NOTE — Anesthesia Postprocedure Evaluation (Signed)
Anesthesia Post Note  Patient: Aimee Burns  Procedure(s) Performed: AN AD [place]     Patient location during evaluation: Mother Baby Anesthesia Type: Epidural Level of consciousness: awake Pain management: satisfactory to patient Vital Signs Assessment: post-procedure vital signs reviewed and stable Respiratory status: spontaneous breathing Cardiovascular status: stable Anesthetic complications: no   No complications documented.  Last Vitals:  Vitals:   04/18/21 0100 04/18/21 0606  BP: (!) 107/54 (!) 105/56  Pulse: 76 74  Resp: 18 18  Temp: 36.8 C 36.7 C  SpO2: 100% 100%    Last Pain:  Vitals:   04/18/21 0606  TempSrc: Oral  PainSc: 0-No pain   Pain Goal:                   [employer]

## 2021-04-18 NOTE — Lactation Note (Signed)
This note was copied from a baby's chart. Lactation Consultation Note  Patient Name: Boy Eden Toohey [id number] Date: 04/18/2021 Reason for consult: Follow-up assessment;Mother's request;1st time breastfeeding;Term;Infant weight loss Age:28 hours Mom stated she had some pain on tip of the nipple. LC reviewed different positions to get a deeper latch. Mom has coconut oil for nipple care and she can line the flanges with it before pumping.   Infant resting at the time of the visit. [place] talked with Dad to call for me to see a latch before they leave.   Infant DAT tve with low bilirubin level on discharge. Mom provided with formula by provider to supplement if her milk volumes are low.  LC provided Mother with breastfeeding supplementation volume based on hrs of age since delivery. Mom to offer her eBM first followed by formula to meet volume after breastfeeding.   Plan 1. To feed based on cues 8-12 x 24 hr period no more than 4 hrs without an attempt. Mom to offer both breasts and look for swallows.           2. Mom to supplement with EBM then formula based on supplementation volume.           3. Mom to pump q  3hrs for 45minutes.  All questions answered at the end of the visit.  Maternal Data    Feeding Mother's Current Feeding Choice: Breast Milk and Formula  LATCH Score                    Lactation Tools Discussed/Used Tools: Pump;Flanges Flange Size: 24 Breast pump type: Double-Electric Breast Pump Reason for Pumping: increase stimulation Pumping frequency: every 3 hrs for 41minutes  Interventions Interventions: Breast feeding basics reviewed;Support pillows;Education;Position options;Skin to skin;Breast compression;DEBP;Coconut oil  Discharge Pump: Personal  Consult Status Consult Status: Complete Date: 04/18/21 Follow-up type: In-patient    Roisin Mones  Nicholson[place]Springer 04/18/2021, 2:31 PM

## 2021-04-18 NOTE — Discharge Instructions (Signed)
NO SEX UNTIL AFTER YOU GET YOUR BIRTH CONTROL   Postpartum Care After Vaginal Delivery The following information offers guidance about how to care for yourself from the time you deliver your baby to 6-12 weeks after delivery (postpartum period). If you have problems or questions, contact your health care provider for more specific instructions. Follow these instructions at home: Vaginal bleeding  It is normal to have vaginal bleeding (lochia) after delivery. Wear a sanitary pad for bleeding and discharge. ? During the first week after delivery, the amount and appearance of lochia is often similar to a menstrual period. ? Over the next few weeks, it will gradually decrease to a dry, yellow-brown discharge. ? For most women, lochia stops completely by 4-6 weeks after delivery, but can vary.  Change your sanitary pads frequently. Watch for any changes in your flow, such as: ? A sudden increase in volume. ? A change in color. ? Large blood clots.  If you pass a blood clot from your vagina, save it and call your health care provider. Do not flush blood clots down the toilet before talking with your health care provider.  Do not use tampons or douches until your health care provider approves.  If you are not breastfeeding, your period should return 6-8 weeks after delivery. If you are feeding your baby breast milk only, your period may not return until you stop breastfeeding. Perineal care  Keep the area between the vagina and the anus (perineum) clean and dry. Use medicated pads and pain-relieving sprays and creams as directed.  If you had a surgical cut in the perineum (episiotomy) or a tear, check the area for signs of infection until you are healed. Check for: ? More redness, swelling, or pain. ? Fluid or blood coming from the cut or tear. ? Warmth. ? Pus or a bad smell.  You may be given a squirt bottle to use instead of wiping to clean the perineum area after you use the bathroom. Pat  the area gently to dry it.  To relieve pain caused by an episiotomy, a tear, or swollen veins in the anus (hemorrhoids), take a warm sitz bath 2-3 times a day. In a sitz bath, the warm water should only come up to your hips and cover your buttocks.   Breast care  In the first few days after delivery, your breasts may feel heavy, full, and uncomfortable (breast engorgement). Milk may also leak from your breasts. Ask your health care provider about ways to help relieve the discomfort.  If you are breastfeeding: ? Wear a bra that supports your breasts and fits well. Use breast pads to absorb milk that leaks. ? Keep your nipples clean and dry. Apply creams and ointments as told. ? You may have uterine contractions every time you breastfeed for up to several weeks after delivery. This helps your uterus return to its normal size. ? If you have any problems with breastfeeding, notify your health care provider or lactation consultant.  If you are not breastfeeding: ? Avoid touching your breasts. Do not squeeze out (express) milk. Doing this can make your breasts produce more milk. ? Wear a good-fitting bra and use cold packs to help with swelling. Intimacy and sexuality  Ask your health care provider when you can engage in sexual activity. This may depend upon: ? Your risk of infection. ? How fast you are healing. ? Your comfort and desire to engage in sexual activity.  You are able to get pregnant after  delivery, even if you have not had your period. Talk with your health care provider about methods of birth control (contraception) or family planning if you desire future pregnancies. Medicines  Take over-the-counter and prescription medicines only as told by your health care provider.  Take an over-the-counter stool softener to help ease bowel movements as told by your health care provider.  If you were prescribed an antibiotic medicine, take it as told by your health care provider. Do not  stop taking the antibiotic even if you start to feel better.  Review all previous and current prescriptions to check for possible transfer into breast milk. Activity  Gradually return to your normal activities as told by your health care provider.  Rest as much as possible. Nap while your baby is sleeping. Eating and drinking  Drink enough fluid to keep your urine pale yellow.  To help prevent or relieve constipation, eat high-fiber foods every day.  Choose healthy eating to support breastfeeding or weight loss goals.  Take your prenatal vitamins until your health care provider tells you to stop.   General tips/recommendations  Do not use any products that contain nicotine or tobacco. These products include cigarettes, chewing tobacco, and vaping devices, such as e-cigarettes. If you need help quitting, ask your health care provider.  Do not drink alcohol, especially if you are breastfeeding.  Do not take medications or drugs that are not prescribed to you, especially if you are breastfeeding.  Visit your health care provider for a postpartum checkup within the first 3-6 weeks after delivery.  Complete a comprehensive postpartum visit no later than 12 weeks after delivery.  Keep all follow-up visits for you and your baby. Contact a health care provider if:  You feel unusually sad or worried.  Your breasts become red, painful, or hard.  You have a fever or other signs of an infection.  You have bleeding that is soaking through one pad an hour or you have blood clots.  You have a severe headache that doesn't go away or you have vision changes.  You have nausea and vomiting and are unable to eat or drink anything for 24 hours. Get help right away if:  You have chest pain or difficulty breathing.  You have sudden, severe leg pain.  You faint or have a seizure.  You have thoughts about hurting yourself or your baby. If you ever feel like you may hurt yourself or others,  or have thoughts about taking your own life, get help right away. Go to your nearest emergency department or:  Call your local emergency services (911 in the [place]  The National Suicide Prevention Lifeline at 903-663-4517. This suicide crisis helpline is open 24 hours a day.  Text the Crisis Text Line at (289) 137-8136 (in the [place].). Summary  The period of time after you deliver your newborn up to 6-12 weeks after delivery is called the postpartum period.  Keep all follow-up visits for you and your baby.  Review all previous and current prescriptions to check for possible transfer into breast milk.  Contact a health care provider if you feel unusually sad or worried during the postpartum period. This information is not intended to replace advice given to you by your health care provider. Make sure you discuss any questions you have with your health care provider. Document Revised: 08/03/2020 Document Reviewed: 08/03/2020 Elsevier Patient Education  2021 [employer].   Breastfeeding  Choosing to breastfeed is one of the best decisions you can make  for yourself and your baby. A change in hormones during pregnancy causes your breasts to make breast milk in your milk-producing glands. Hormones prevent breast milk from being released before your baby is born. They also prompt milk flow after birth. Once breastfeeding has begun, thoughts of your baby, as well as his or her sucking or crying, can stimulate the release of milk from your milk-producing glands. Benefits of breastfeeding Research shows that breastfeeding offers many health benefits for infants and mothers. It also offers a cost-free and convenient way to feed your baby. For your baby  Your first milk (colostrum) helps your baby's digestive system to function better.  Special cells in your milk (antibodies) help your baby to fight off infections.  Breastfed babies are less likely to develop asthma, allergies, obesity, or type 2  diabetes. They are also at lower risk for sudden infant death syndrome (SIDS).  Nutrients in breast milk are better able to meet your baby's needs compared to infant formula.  Breast milk improves your baby's brain development. For you  Breastfeeding helps to create a very special bond between you and your baby.  Breastfeeding is convenient. Breast milk costs nothing and is always available at the correct temperature.  Breastfeeding helps to burn calories. It helps you to lose the weight that you gained during pregnancy.  Breastfeeding makes your uterus return faster to its size before pregnancy. It also slows bleeding (lochia) after you give birth.  Breastfeeding helps to lower your risk of developing type 2 diabetes, osteoporosis, rheumatoid arthritis, cardiovascular disease, and breast, ovarian, uterine, and endometrial cancer later in life. Breastfeeding basics Starting breastfeeding  Find a comfortable place to sit or lie down, with your neck and back well-supported.  Place a pillow or a rolled-up blanket under your baby to bring him or her to the level of your breast (if you are seated). Nursing pillows are specially designed to help support your arms and your baby while you breastfeed.  Make sure that your baby's tummy (abdomen) is facing your abdomen.  Gently massage your breast. With your fingertips, massage from the outer edges of your breast inward toward the nipple. This encourages milk flow. If your milk flows slowly, you may need to continue this action during the feeding.  Support your breast with 4 fingers underneath and your thumb above your nipple (make the letter "C" with your hand). Make sure your fingers are well away from your nipple and your baby's mouth.  Stroke your baby's lips gently with your finger or nipple.  When your baby's mouth is open wide enough, quickly bring your baby to your breast, placing your entire nipple and as much of the areola as possible  into your baby's mouth. The areola is the colored area around your nipple. ? More areola should be visible above your baby's upper lip than below the lower lip. ? Your baby's lips should be opened and extended outward (flanged) to ensure an adequate, comfortable latch. ? Your baby's tongue should be between his or her lower gum and your breast.  Make sure that your baby's mouth is correctly positioned around your nipple (latched). Your baby's lips should create a seal on your breast and be turned out (everted).  It is common for your baby to suck about 2-3 minutes in order to start the flow of breast milk. Latching Teaching your baby how to latch onto your breast properly is very important. An improper latch can cause nipple pain, decreased milk supply, and  poor weight gain in your baby. Also, if your baby is not latched onto your nipple properly, he or she may swallow some air during feeding. This can make your baby fussy. Burping your baby when you switch breasts during the feeding can help to get rid of the air. However, teaching your baby to latch on properly is still the best way to prevent fussiness from swallowing air while breastfeeding. Signs that your baby has successfully latched onto your nipple  Silent tugging or silent sucking, without causing you pain. Infant's lips should be extended outward (flanged).  Swallowing heard between every 3-4 sucks once your milk has started to flow (after your let-down milk reflex occurs).  Muscle movement above and in front of his or her ears while sucking. Signs that your baby has not successfully latched onto your nipple  Sucking sounds or smacking sounds from your baby while breastfeeding.  Nipple pain. If you think your baby has not latched on correctly, slip your finger into the corner of your baby's mouth to break the suction and place it between your baby's gums. Attempt to start breastfeeding again. Signs of successful breastfeeding Signs  from your baby  Your baby will gradually decrease the number of sucks or will completely stop sucking.  Your baby will fall asleep.  Your baby's body will relax.  Your baby will retain a small amount of milk in his or her mouth.  Your baby will let go of your breast by himself or herself. Signs from you  Breasts that have increased in firmness, weight, and size 1-3 hours after feeding.  Breasts that are softer immediately after breastfeeding.  Increased milk volume, as well as a change in milk consistency and color by the fifth day of breastfeeding.  Nipples that are not sore, cracked, or bleeding. Signs that your baby is getting enough milk  Wetting at least 1-2 diapers during the first 24 hours after birth.  Wetting at least 5-6 diapers every 24 hours for the first week after birth. The urine should be clear or pale yellow by the age of 5 days.  Wetting 6-8 diapers every 24 hours as your baby continues to grow and develop.  At least 3 stools in a 24-hour period by the age of 5 days. The stool should be soft and yellow.  At least 3 stools in a 24-hour period by the age of 7 days. The stool should be seedy and yellow.  No loss of weight greater than 10% of birth weight during the first 3 days of life.  Average weight gain of 4-7 oz (113-198 g) per week after the age of 4 days.  Consistent daily weight gain by the age of 5 days, without weight loss after the age of 2 weeks. After a feeding, your baby may spit up a small amount of milk. This is normal. Breastfeeding frequency and duration Frequent feeding will help you make more milk and can prevent sore nipples and extremely full breasts (breast engorgement). Breastfeed when you feel the need to reduce the fullness of your breasts or when your baby shows signs of hunger. This is called "breastfeeding on demand." Signs that your baby is hungry include:  Increased alertness, activity, or restlessness.  Movement of the head  from side to side.  Opening of the mouth when the corner of the mouth or cheek is stroked (rooting).  Increased sucking sounds, smacking lips, cooing, sighing, or squeaking.  Hand-to-mouth movements and sucking on fingers or hands.  Fussing or crying. Avoid introducing a pacifier to your baby in the first 4-6 weeks after your baby is born. After this time, you may choose to use a pacifier. Research has shown that pacifier use during the first year of a baby's life decreases the risk of sudden infant death syndrome (SIDS). Allow your baby to feed on each breast as long as he or she wants. When your baby unlatches or falls asleep while feeding from the first breast, offer the second breast. Because newborns are often sleepy in the first few weeks of life, you may need to awaken your baby to get him or her to feed. Breastfeeding times will vary from baby to baby. However, the following rules can serve as a guide to help you make sure that your baby is properly fed:  Newborns (babies 19 weeks of age or younger) may breastfeed every 1-3 hours.  Newborns should not go without breastfeeding for longer than 3 hours during the day or 5 hours during the night.  You should breastfeed your baby a minimum of 8 times in a 24-hour period. Breast milk pumping Pumping and storing breast milk allows you to make sure that your baby is exclusively fed your breast milk, even at times when you are unable to breastfeed. This is especially important if you go back to work while you are still breastfeeding, or if you are not able to be present during feedings. Your lactation consultant can help you find a method of pumping that works best for you and give you guidelines about how long it is safe to store breast milk.      Caring for your breasts while you breastfeed Nipples can become dry, cracked, and sore while breastfeeding. The following recommendations can help keep your breasts moisturized and healthy:  Avoid  using soap on your nipples.  Wear a supportive bra designed especially for nursing. Avoid wearing underwire-style bras or extremely tight bras (sports bras).  Air-dry your nipples for 3-4 minutes after each feeding.  Use only cotton bra pads to absorb leaked breast milk. Leaking of breast milk between feedings is normal.  Use lanolin on your nipples after breastfeeding. Lanolin helps to maintain your skin's normal moisture barrier. Pure lanolin is not harmful (not toxic) to your baby. You may also hand express a few drops of breast milk and gently massage that milk into your nipples and allow the milk to air-dry. In the first few weeks after giving birth, some women experience breast engorgement. Engorgement can make your breasts feel heavy, warm, and tender to the touch. Engorgement peaks within 3-5 days after you give birth. The following recommendations can help to ease engorgement:  Completely empty your breasts while breastfeeding or pumping. You may want to start by applying warm, moist heat (in the shower or with warm, water-soaked hand towels) just before feeding or pumping. This increases circulation and helps the milk flow. If your baby does not completely empty your breasts while breastfeeding, pump any extra milk after he or she is finished.  Apply ice packs to your breasts immediately after breastfeeding or pumping, unless this is too uncomfortable for you. To do this: ? Put ice in a plastic bag. ? Place a towel between your skin and the bag. ? Leave the ice on for 20 minutes, 2-3 times a day.  Make sure that your baby is latched on and positioned properly while breastfeeding. If engorgement persists after 48 hours of following these recommendations, contact your health care  provider or a Science writer. Overall health care recommendations while breastfeeding  Eat 3 healthy meals and 3 snacks every day. Well-nourished mothers who are breastfeeding need an additional 450-500  calories a day. You can meet this requirement by increasing the amount of a balanced diet that you eat.  Drink enough water to keep your urine pale yellow or clear.  Rest often, relax, and continue to take your prenatal vitamins to prevent fatigue, stress, and low vitamin and mineral levels in your body (nutrient deficiencies).  Do not use any products that contain nicotine or tobacco, such as cigarettes and e-cigarettes. Your baby may be harmed by chemicals from cigarettes that pass into breast milk and exposure to secondhand smoke. If you need help quitting, ask your health care provider.  Avoid alcohol.  Do not use illegal drugs or marijuana.  Talk with your health care provider before taking any medicines. These include over-the-counter and prescription medicines as well as vitamins and herbal supplements. Some medicines that may be harmful to your baby can pass through breast milk.  It is possible to become pregnant while breastfeeding. If birth control is desired, ask your health care provider about options that will be safe while breastfeeding your baby. Where to find more information: [employer]: www.llli.org Contact a health care provider if:  You feel like you want to stop breastfeeding or have become frustrated with breastfeeding.  Your nipples are cracked or bleeding.  Your breasts are red, tender, or warm.  You have: ? Painful breasts or nipples. ? A swollen area on either breast. ? A fever or chills. ? Nausea or vomiting. ? Drainage other than breast milk from your nipples.  Your breasts do not become full before feedings by the fifth day after you give birth.  You feel sad and depressed.  Your baby is: ? Too sleepy to eat well. ? Having trouble sleeping. ? More than 25 week old and wetting fewer than 6 diapers in a 24-hour period. ? Not gaining weight by 89 days of age.  Your baby has fewer than 3 stools in a 24-hour period.  Your baby's  skin or the white parts of his or her eyes become yellow. Get help right away if:  Your baby is overly tired (lethargic) and does not want to wake up and feed.  Your baby develops an unexplained fever. Summary  Breastfeeding offers many health benefits for infant and mothers.  Try to breastfeed your infant when he or she shows early signs of hunger.  Gently tickle or stroke your baby's lips with your finger or nipple to allow the baby to open his or her mouth. Bring the baby to your breast. Make sure that much of the areola is in your baby's mouth. Offer one side and burp the baby before you offer the other side.  Talk with your health care provider or lactation consultant if you have questions or you face problems as you breastfeed. This information is not intended to replace advice given to you by your health care provider. Make sure you discuss any questions you have with your health care provider. Document Revised: 02/12/2018 Document Reviewed: 12/20/2016 Elsevier Patient Education  2021 [employer].

## 2021-04-19 LAB — RH IG WORKUP (INCLUDES ABO/RH)
Fetal Screen: NEGATIVE
Gestational Age(Wks): 39.4
Unit division: 0

## 2021-05-23 ENCOUNTER — Other Ambulatory Visit: Payer: Self-pay

## 2021-05-23 ENCOUNTER — Encounter: Payer: Self-pay | Admitting: Women's Health

## 2021-05-23 ENCOUNTER — Ambulatory Visit (INDEPENDENT_AMBULATORY_CARE_PROVIDER_SITE_OTHER): Payer: 59 | Admitting: Women's Health

## 2021-05-23 DIAGNOSIS — Z30013 Encounter for initial prescription of injectable contraceptive: Secondary | ICD-10-CM | POA: Diagnosis not present

## 2021-05-23 DIAGNOSIS — Z3202 Encounter for pregnancy test, result negative: Secondary | ICD-10-CM

## 2021-05-23 LAB — POCT URINE PREGNANCY: Preg Test, Ur: NEGATIVE

## 2021-05-23 MED ORDER — MEDROXYPROGESTERONE ACETATE 150 MG/ML IM SUSP
150.0000 mg | INTRAMUSCULAR | 3 refills | Status: DC
Start: 2021-05-23 — End: 2022-12-15

## 2021-05-23 MED ORDER — MEDROXYPROGESTERONE ACETATE 150 MG/ML IM SUSP
150.0000 mg | Freq: Once | INTRAMUSCULAR | Status: AC
  Administered 2021-05-23: 150 mg via INTRAMUSCULAR

## 2021-05-23 NOTE — Addendum Note (Signed)
Addended by: Linton Rump on: 05/23/2021 03:12 PM   Modules accepted: Orders

## 2021-05-23 NOTE — Addendum Note (Signed)
Addended by: Linton Rump on: 05/23/2021 02:36 PM   Modules accepted: Orders

## 2021-05-23 NOTE — Patient Instructions (Signed)
Medroxyprogesterone Injection (Contraception) What is this medication? MEDROXYPROGESTERONE (me DROX ee proe JES te rone) prevents ovulation and pregnancy. It belongs to a group of medications called contraceptives. Thismedication is a progestin hormone. This medicine may be used for other purposes; ask your health care provider orpharmacist if you have questions. COMMON BRAND NAME(S): Depo-Provera, Depo-subQ Provera 104 What should I tell my care team before I take this medication? They need to know if you have any of these conditions: Asthma Blood clots Breast cancer or family history of breast cancer Depression Diabetes Eating disorder (anorexia nervosa) Heart attack High blood pressure HIV infection or AIDS If you often drink alcohol Kidney disease Liver disease Migraine headaches Osteoporosis, weak bones Seizures Stroke Tobacco smoker Vaginal bleeding An unusual or allergic reaction to medroxyprogesterone, other hormones, medications, foods, dyes, or preservatives Pregnant or trying to get pregnant Breast-feeding How should I use this medication? Depo-Provera CI contraceptive injection is given into a muscle. Depo-subQ Provera 104 injection is given under the skin. It is given in a hospital or clinic setting. The injection is usually given during the first 5 days afterthe start of a menstrual period or 6 weeks after delivery of a baby. A patient package insert for the product will be given with each prescription and refill. Be sure to read this information carefully each time. The sheet maychange often. Talk to your care team about the use of this medication in children. Special care may be needed. These injections have been used in female children who havestarted having menstrual periods. Overdosage: If you think you have taken too much of this medicine contact apoison control center or emergency room at once. NOTE: This medicine is only for you. Do not share this medicine with  others. What if I miss a dose? Keep appointments for follow-up doses. You must get an injection once every 3 months. It is important not to miss your dose. Call your care team if you areunable to keep an appointment. What may interact with this medication? Antibiotics or medications for infections, especially rifampin and griseofulvin Antivirals for HIV or hepatitis Aprepitant Armodafinil Bexarotene Bosentan Medications for seizures like carbamazepine, felbamate, oxcarbazepine, phenytoin, phenobarbital, primidone, topiramate Mitotane Modafinil St. John's wort This list may not describe all possible interactions. Give your health care provider a list of all the medicines, herbs, non-prescription drugs, or dietary supplements you use. Also tell them if you smoke, drink alcohol, or use illegaldrugs. Some items may interact with your medicine. What should I watch for while using this medication? This medication does not protect you against HIV infection (AIDS) or othersexually transmitted diseases. Use of this product may cause you to lose calcium from your bones. Loss of calcium may cause weak bones (osteoporosis). Only use this product for more than 2 years if other forms of birth control are not right for you. The longer you use this product for birth control the more likely you will be at risk forweak bones. Ask your care team how you can keep strong bones. You may have a change in bleeding pattern or irregular periods. Many femalesstop having periods while taking this medication. If you have received your injections on time, your chance of being pregnant is very low. If you think you may be pregnant, see your care team as soon aspossible. Tell your care team if you want to get pregnant within the next year. The effect of this medication may last a long time after you get your lastinjection. What side effects may I  notice from receiving this medication? Side effects that you should report to  your care team as soon as possible: Allergic reactions-skin rash, itching, hives, swelling of the face, lips, tongue, or throat Blood clot-pain, swelling, or warmth in the leg, shortness of breath, chest pain Gallbladder problems-severe stomach pain, nausea, vomiting, fever Increase in blood pressure Liver injury-right upper belly pain, loss of appetite, nausea, light-colored stool, dark yellow or brown urine, yellowing skin or eyes, unusual weakness or fatigue New or worsening migraines or headaches Seizures Stroke-sudden numbness or weakness of the face, arm, or leg, trouble speaking, confusion, trouble walking, loss of balance or coordination, dizziness, severe headache, change in vision Unusual vaginal discharge, itching, or odor Worsening mood, feelings of depression Side effects that usually do not require medical attention (report to your careteam if they continue or are bothersome): Breast pain or tenderness Dark patches of the skin on the face or other sun-exposed areas Irregular menstrual cycles or spotting Nausea Weight gain This list may not describe all possible side effects. Call your doctor for medical advice about side effects. You may report side effects to FDA at1-800-FDA-1088. Where should I keep my medication? This injection is only given by a care team. It will not be stored at home. NOTE: This sheet is a summary. It may not cover all possible information. If you have questions about this medicine, talk to your doctor, pharmacist, orhealth care provider.  2022 Elsevier/Gold Standard (2020-12-25 12:23:33)

## 2021-05-23 NOTE — Progress Notes (Signed)
POSTPARTUM VISIT Patient name: Aimee Burns MRN [id number]  Date of birth: 09-13-1991 Chief Complaint:   Postpartum Care (Talk [facility])  History of Present Illness:   Aimee Burns is a 30 y.o. G60P3003 Caucasian female being seen today for a postpartum visit. She is 5 weeks postpartum following a spontaneous vaginal delivery at 39.4 gestational weeks. IOL: no, for n/a. Anesthesia: epidural.  Laceration: none.  Complications: none. Inpatient contraception: no.   Pregnancy uncomplicated. Tobacco use: no. Substance use disorder: no. Last pap smear: 12/31/18 and results were NILM w/ HRHPV not done. Next pap smear due: 2023 Patient's last menstrual period was 05/20/2021.  Postpartum course has been uncomplicated. Bleeding  on a period . Bowel function is normal. Bladder function is normal. Urinary incontinence? no, fecal incontinence? no Patient is not sexually active. Last sexual activity: prior to birth of baby. Desired contraception: Depo. Patient does not want a pregnancy in the future.  Desired family size is 3 children.   Upstream - 05/23/21 1415       Pregnancy Intention Screening   Does the patient want to become pregnant in the next year? No    Does the patient's partner want to become pregnant in the next year? No    Would the patient like to discuss contraceptive options today? Yes      Contraception Wrap Up   Current Method Abstinence    End Method Abstinence    Contraception Counseling Provided Yes            The pregnancy intention screening data noted above was reviewed. Potential methods of contraception were discussed. The patient elected to proceed with Hormonal Injection.   Edinburgh Postpartum Depression Screening: negative  Edinburgh Postnatal Depression Scale - 05/23/21 1402       Edinburgh Postnatal Depression Scale:  In the Past 7 Days   I have been able to laugh and see the funny side of things. 0    I have looked forward with enjoyment to  things. 0    I have blamed myself unnecessarily when things went wrong. 0    I have been anxious or worried for no good reason. 0    I have felt scared or panicky for no good reason. 0    Things have been getting on top of me. 0    I have been so unhappy that I have had difficulty sleeping. 0    I have felt sad or miserable. 0    I have been so unhappy that I have been crying. 0    The thought of harming myself has occurred to me. 0    Edinburgh Postnatal Depression Scale Total 0             GAD 7 : Generalized Anxiety Score 10/13/2020  Nervous, Anxious, on Edge 0  Control/stop worrying 0  Worry too much - different things 0  Trouble relaxing 0  Restless 0  Easily annoyed or irritable 0  Afraid - awful might happen 0  Total GAD 7 Score 0     Baby's course has been uncomplicated. Baby is feeding by bottle. Infant has a pediatrician/family doctor? Yes.  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  Pt has material needs met for her and baby: Yes.   Review of Systems:   Pertinent items are noted in HPI Denies Abnormal vaginal discharge w/ itching/odor/irritation, headaches, visual changes, shortness of breath, chest pain, abdominal pain, severe nausea/vomiting, or problems with urination or bowel movements.  Pertinent History Reviewed:  Reviewed past medical,surgical, obstetrical and family history.  Reviewed problem list, medications and allergies. OB History  Gravida Para Term Preterm AB Living  3 3 3     3   SAB IAB Ectopic Multiple Live Births        0 3    # Outcome Date GA Lbr Len/2nd Weight Sex Delivery Anes PTL Lv  3 Term 04/17/21 32w4d05:27 / 01:28 8 lb 6.2 oz (3.805 kg) M Vag-Spont EPI  LIV  2 Term 03/06/17 334w5d 01:15 8 lb 3 oz (3.714 kg) M Vag-Spont EPI  LIV  1 Term 05/17/13 3781w3d:15 / 00:39 5 lb 8.2 oz (2.5 kg) M Vag-Vacuum EPI  LIV   Physical Assessment:   Vitals:   05/23/21 1356  BP: 109/69  Pulse: 69  Weight: 163 lb (73.9 kg)  Height:  5' 7"  (1.702 m)  Body mass index is 25.53 kg/m.       Physical Examination:   General appearance: alert, well appearing, and in no distress  Mental status: alert, oriented to person, place, and time  Skin: warm & dry   Cardiovascular: normal heart rate noted   Respiratory: normal respiratory effort, no distress   Breasts: deferred, no complaints   Abdomen: soft, non-tender   Pelvic: examination not indicated. Thin prep pap obtained: No  Rectal: not examined  Extremities: Edema: none   Chaperone: N/A         No results found for this or any previous visit (from the past 24 hour(s)).  Assessment & Plan:  1) Postpartum exam 2) 5 wks s/p spontaneous vaginal delivery 3) bottle feeding 4) Depression screening 5) Contraception management: 1st depo today, rx sent, condoms x 2wks  Essential components of care per ACOG recommendations:  1.  Mood and well being:  If positive depression screen, discussed and plan developed.  If using tobacco we discussed reduction/cessation and risk of relapse If current substance abuse, we discussed and referral to local resources was offered.   2. Infant care and feeding:  If breastfeeding, discussed returning to work, pumping, breastfeeding-associated pain, guidance regarding return to fertility while lactating if not using another method. If needed, patient was provided with a letter to be allowed to pump q 2-3hrs to support lactation in a private location with access to a refrigerator to store breastmilk.   Recommended that all caregivers be immunized for flu, pertussis and other preventable communicable diseases If pt does not have material needs met for her/baby, referred to local resources for help obtaining these.  3. Sexuality, contraception and birth spacing Provided guidance regarding sexuality, management of dyspareunia, and resumption of intercourse Discussed avoiding interpregnancy interval <6mt53mand recommended birth spacing of 18  months  4. Sleep and fatigue Discussed coping options for fatigue and sleep disruption Encouraged family/partner/community support of 4 hrs of uninterrupted sleep to help with mood and fatigue  5. Physical recovery  If pt had a C/S, assessed incisional pain and providing guidance on normal vs prolonged recovery If pt had a laceration, perineal healing and pain reviewed.  If urinary or fecal incontinence, discussed management and referred to PT or uro/gyn if indicated  Patient is safe to resume physical activity. Discussed attainment of healthy weight.  6.  Chronic disease management Discussed pregnancy complications if any, and their implications for future childbearing and long-term maternal health. Review recommendations for prevention of recurrent pregnancy complications, such as 17 hydroxyprogesterone caproate to reduce risk for recurrent PTB not applicable, or  aspirin to reduce risk of preeclampsia not applicable. Pt had GDM: no. If yes, 2hr GTT scheduled: not applicable. Reviewed medications and non-pregnant dosing including consideration of whether pt is breastfeeding using a reliable resource such as LactMed: not applicable Referred for f/u w/ PCP or subspecialist providers as indicated: not applicable  7. Health maintenance Mammogram at 28yo or earlier if indicated Pap smears as indicated  Meds:  Meds ordered this encounter  Medications   medroxyPROGESTERone (DEPO-PROVERA) 150 MG/ML injection    Sig: Inject 1 mL (150 mg total) into the muscle every 3 (three) months.    Dispense:  1 mL    Refill:  3    Order Specific Question:   Supervising Provider    Answer:   Florian Buff [[id number]]    Follow-up: Return for q 11-13wks for depo; 68yrfor pap & physical.   No orders of the defined types were placed in this encounter.   [facility]6/22/2022 2:27 PM

## 2021-08-15 ENCOUNTER — Ambulatory Visit: Payer: 59

## 2021-08-21 ENCOUNTER — Encounter (HOSPITAL_COMMUNITY): Payer: Self-pay | Admitting: *Deleted

## 2021-08-31 ENCOUNTER — Other Ambulatory Visit: Payer: Self-pay | Admitting: *Deleted

## 2021-08-31 DIAGNOSIS — M25569 Pain in unspecified knee: Secondary | ICD-10-CM

## 2021-09-04 ENCOUNTER — Other Ambulatory Visit: Payer: Self-pay

## 2021-09-04 ENCOUNTER — Ambulatory Visit (HOSPITAL_COMMUNITY)
Admission: RE | Admit: 2021-09-04 | Discharge: 2021-09-04 | Disposition: A | Discharge status: Home / Self Care | Payer: 59 | Source: Ambulatory Visit | Attending: Vascular Surgery | Admitting: Vascular Surgery

## 2021-09-04 DIAGNOSIS — M25569 Pain in unspecified knee: Secondary | ICD-10-CM | POA: Diagnosis not present

## 2021-09-04 DIAGNOSIS — M25562 Pain in left knee: Secondary | ICD-10-CM

## 2021-09-24 ENCOUNTER — Encounter: Payer: Self-pay | Admitting: Surgery

## 2021-09-24 ENCOUNTER — Ambulatory Visit (INDEPENDENT_AMBULATORY_CARE_PROVIDER_SITE_OTHER): Payer: 59 | Admitting: Surgery

## 2021-09-24 ENCOUNTER — Other Ambulatory Visit: Payer: Self-pay

## 2021-09-24 VITALS — BP 133/86 | HR 88 | Temp 98.4°F | Resp 20 | Ht 67.0 in | Wt 169.0 lb

## 2021-09-24 DIAGNOSIS — I872 Venous insufficiency (chronic) (peripheral): Secondary | ICD-10-CM | POA: Diagnosis not present

## 2021-09-24 NOTE — Progress Notes (Signed)
[facility] [place]  Patient name: Aimee Burns MRN: [id number] DOB: [birth year] Sex: female   REQUESTING PROVIDER:    Lorenza Evangelist   REASON FOR CONSULT:    Varicose veins  HISTORY OF PRESENT ILLNESS:   Aimee Burns is a 30 y.o. female, who is here today for evaluation of leg pain.  This has been going on for several years.  She describes this as a tingling in both of her legs and they also feel cold in nature and cause her discomfort.  She has intermittently worn compression stockings with some relief.  Her symptoms are worse at the end of the day when she has been on her feet all day.  She does not endorse any significant swelling.  She is never had a DVT.  PAST MEDICAL HISTORY    Past Medical History:  Diagnosis Date   Nexplanon insertion 07/08/2013   Placed in left arm 07/08/13 remove 07/08/16   Pregnancy    Renal calculi    2011,right parathyroidectomy   Thyroid tumor, benign      FAMILY HISTORY   Family History  Problem Relation Age of Onset   Diabetes Paternal Grandmother    Cancer Paternal Grandmother        lung   Thyroid disease Mother     SOCIAL HISTORY:   Social History   Socioeconomic History   Marital status: Married    Spouse name: Not on file   Number of children: Not on file   Years of education: Not on file   Highest education level: Not on file  Occupational History   Not on file  Tobacco Use   Smoking status: Never   Smokeless tobacco: Never  Vaping Use   Vaping Use: Never used  Substance and Sexual Activity   Alcohol use: Not Currently    Comment: occassionaly   Drug use: No   Sexual activity: Not Currently    Birth control/protection: None  Other Topics Concern   Not on file  Social History Narrative   Not on file   Social Determinants of Health   Financial Resource Strain: Low Risk    Difficulty of Paying Living Expenses: Not hard at all  Food  Insecurity: No Food Insecurity   Worried About Charity fundraiser in the Last Year: Never true   [place] in the Last Year: Never true  Transportation Needs: No Transportation Needs   Lack of Transportation (Medical): No   Lack of Transportation (Non-Medical): No  Physical Activity: Insufficiently Active   Days of Exercise per Week: 5 days   Minutes of Exercise per Session: 10 min  Stress: No Stress Concern Present   Feeling of Stress : Not at all  Social Connections: Moderately Isolated   Frequency of Communication with Friends and Family: Twice a week   Frequency of Social Gatherings with Friends and Family: Twice a week   Attends Religious Services: Never   Printmaker: No   Attends Music therapist: Never   Marital Status: Married  Human resources officer Violence: Not At Risk   Fear of Current or Ex-Partner: No   Emotionally Abused: No   Physically Abused: No   Sexually Abused: No    ALLERGIES:    Allergies  Allergen Reactions   Amoxicillin Rash    Has patient had a PCN reaction causing immediate rash, facial/tongue/throat swelling, SOB or lightheadedness with hypotension: Yes Has patient had a  PCN reaction causing severe rash involving mucus membranes or skin necrosis: No Has patient had a PCN reaction that required hospitalization No Has patient had a PCN reaction occurring within the last 10 years: Yes If all of the above answers are "NO", then may proceed with Cephalosporin use.     CURRENT MEDICATIONS:    Current Outpatient Medications  Medication Sig Dispense Refill   medroxyPROGESTERone (DEPO-PROVERA) 150 MG/ML injection Inject 1 mL (150 mg total) into the muscle every 3 (three) months. 1 mL 3   No current facility-administered medications for this visit.    REVIEW OF SYSTEMS:   [X]  denotes positive finding, [ ]  denotes negative finding Cardiac  Comments:  Chest pain or chest pressure:    Shortness of breath  upon exertion:    Short of breath when lying flat:    Irregular heart rhythm:        Vascular    Pain in calf, thigh, or hip brought on by ambulation:    Pain in feet at night that wakes you up from your sleep:     Blood clot in your veins:    Leg swelling:         Pulmonary    Oxygen at home:    Productive cough:     Wheezing:         Neurologic    Sudden weakness in arms or legs:     Sudden numbness in arms or legs:     Sudden onset of difficulty speaking or slurred speech:    Temporary loss of vision in one eye:     Problems with dizziness:         Gastrointestinal    Blood in stool:      Vomited blood:         Genitourinary    Burning when urinating:     Blood in urine:        Psychiatric    Major depression:         Hematologic    Bleeding problems:    Problems with blood clotting too easily:        Skin    Rashes or ulcers:        Constitutional    Fever or chills:     PHYSICAL EXAM:   Vitals:   09/24/21 1517  BP: 133/86  Pulse: 88  Resp: 20  Temp: 98.4 F (36.9 C)  SpO2: 99%  Weight: 169 lb (76.7 kg)  Height: 5\' 7"  (1.702 m)    GENERAL: The patient is a well-nourished female, in no acute distress. The vital signs are documented above. CARDIAC: There is a regular rate and rhythm.  VASCULAR: Palpable pedal pulses.  No edema. PULMONARY: Nonlabored respirations MUSCULOSKELETAL: There are no major deformities or cyanosis. NEUROLOGIC: No focal weakness or paresthesias are detected. SKIN: There are no ulcers or rashes noted. PSYCHIATRIC: The patient has a normal affect.  STUDIES:   I have reviewed her ultrasound with the following findings:  +--------------+---------+------+-----------+------------+--------+  LEFT          Reflux NoRefluxReflux TimeDiameter cmsComments                          Yes                                   +--------------+---------+------+-----------+------------+--------+  CFV  yes    >1 second                       +--------------+---------+------+-----------+------------+--------+  FV mid        no                                              +--------------+---------+------+-----------+------------+--------+  Popliteal               yes   >1 second                       +--------------+---------+------+-----------+------------+--------+  GSV at SFJ              yes    >500 ms     0.632              +--------------+---------+------+-----------+------------+--------+  GSV prox thigh          yes    >500 ms      0.48              +--------------+---------+------+-----------+------------+--------+  GSV mid thigh no                           0.315              +--------------+---------+------+-----------+------------+--------+  GSV dist thighno                           0.365              +--------------+---------+------+-----------+------------+--------+  GSV at knee   no                           0.351              +--------------+---------+------+-----------+------------+--------+  GSV prox calf no                           0.292              +--------------+---------+------+-----------+------------+--------+  SSV Pop Fossa                  >500 ms     0.237              +--------------+---------+------+-----------+------------+--------+  SSV prox calf           yes    >500 ms      0.16              +--------------+---------+------+-----------+------------+--------+  SSV mid calf  no                            0.16              +--------------+---------+------+-----------+------------+--------+           Summary:  Left:  - No evidence of deep vein thrombosis seen in the left lower extremity,  from the common femoral through the popliteal veins.  - No evidence of superficial venous thrombosis in the left lower  extremity.  - Deep vein reflux in the CFV and popliteal vein.  -  Superficial vein reflux in the SSV, SFJ, and GSV.  ASSESSMENT and PLAN   Chronic venous insufficiency: I discussed with the patient that she does have mild bilateral reflux.  It is certainly not to the point where I would consider intervention.  She should consider 20-30 knee-high compression socks to see if this helps with her discomfort.  She is going to contact me if she has a change in her symptoms.   Leia Alf, MD, FACS [facility] [phone] Pager [phone]

## 2022-12-15 ENCOUNTER — Ambulatory Visit: Admit: 2022-12-15 | Payer: 59

## 2022-12-15 ENCOUNTER — Ambulatory Visit
Admission: EM | Admit: 2022-12-15 | Discharge: 2022-12-15 | Disposition: A | Discharge status: Home / Self Care | Payer: No Typology Code available for payment source | Attending: Family Medicine | Admitting: Family Medicine

## 2022-12-15 ENCOUNTER — Other Ambulatory Visit: Payer: Self-pay

## 2022-12-15 ENCOUNTER — Encounter: Payer: Self-pay | Admitting: Emergency Medicine

## 2022-12-15 DIAGNOSIS — J020 Streptococcal pharyngitis: Secondary | ICD-10-CM | POA: Diagnosis not present

## 2022-12-15 DIAGNOSIS — J029 Acute pharyngitis, unspecified: Secondary | ICD-10-CM | POA: Diagnosis not present

## 2022-12-15 LAB — POCT RAPID STREP A (OFFICE): Rapid Strep A Screen: POSITIVE — AB

## 2022-12-15 MED ORDER — AMOXICILLIN 875 MG PO TABS: 875.0000 mg | ORAL_TABLET | Freq: Two times a day (BID) | ORAL | 0 refills | Status: AC

## 2022-12-15 NOTE — ED Triage Notes (Addendum)
Patient presents to Urgent Care with complaints of sore throat since 1 day ago. Patient reports sore throat worsening yesterday. Having increased pain with swallowing with fluids. Denies any fever or chills. She has had strep throat in the past.  Has taken Tylenol and Ibuprofen for pain.

## 2022-12-15 NOTE — Discharge Instructions (Addendum)
Advised patient to take medication as directed with food to completion.  Encouraged patient to increase daily water intake while taking this medication.  Advised if symptoms worsen and/or unresolved please follow-up with PCP or here for further evaluation.

## 2022-12-15 NOTE — ED Provider Notes (Signed)
[facility]    CSN: [id number] Arrival date & time: 12/15/22  4098      History   Chief Complaint Chief Complaint  Patient presents with   Sore Throat    HPI Aimee Burns is a 32 y.o. female.   HPI 32 year old female presents with sore throat for 1 day reports history of strep throat.  PMH significant for renal calculi, thyroid tumor and h/o parathyroidectomy.  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 Authorizing Provider  amoxicillin (AMOXIL) 875 MG tablet Take 1 tablet (875 mg total) by mouth 2 (two) times daily for 7 days. 12/15/22 12/22/22 Yes Eliezer Lofts, FNP    Family History Family History  Problem Relation Age of Onset   Diabetes Paternal Grandmother    Cancer Paternal Grandmother        lung   Thyroid disease Mother     Social History Social History   Tobacco Use   Smoking status: Never   Smokeless tobacco: Never  Vaping Use   Vaping Use: Never used  Substance  Use Topics   Alcohol use: Not Currently    Comment: occassionaly   Drug use: No     Allergies   Patient has no known allergies.   Review of Systems Review of Systems  All other systems reviewed and are negative.    Physical Exam Triage Vital Signs ED Triage Vitals [12/15/22 0927]  Enc Vitals Group     BP      Pulse      Resp      Temp      Temp src      SpO2      Weight      Height      Head Circumference      Peak Flow      Pain Score 6     Pain Loc      Pain Edu?      Excl. in [place]?    No data found.  Updated Vital Signs BP (!) 128/91 (BP Location: Right Arm)   Pulse (!) 106   Temp 98.8 F (37.1 C) (Oral)   Resp 16   SpO2  100%   Visual Acuity Right Eye Distance:   Left Eye Distance:   Bilateral Distance:    Right Eye Near:   Left Eye Near:    Bilateral Near:     Physical Exam Vitals and nursing note reviewed.  Constitutional:      General: She is not in acute distress.    Appearance: She is well-developed. She is obese. She is ill-appearing.  HENT:     Head: Normocephalic and atraumatic.     Right Ear: Tympanic membrane and ear canal normal.     Left Ear: Tympanic membrane and ear canal normal.     Mouth/Throat:     Mouth: Mucous membranes are moist.     Pharynx: Oropharynx is clear. Uvula midline. Posterior oropharyngeal erythema and uvula swelling present.     Tonsils: 4+ on the right. 4+ on the left.  Eyes:     Conjunctiva/sclera: Conjunctivae normal.     Pupils: Pupils are equal, round, and reactive to light.  Cardiovascular:     Rate and Rhythm: Normal rate and regular rhythm.     Heart sounds: Normal heart sounds. No murmur heard. Pulmonary:     Effort: Pulmonary effort is normal.     Breath sounds: Normal breath sounds. No wheezing or rhonchi.  Musculoskeletal:     Cervical back: Normal range of motion and neck supple.  Lymphadenopathy:     Cervical: No cervical adenopathy.  Skin:    General: Skin is warm and dry.   Neurological:     General: No focal deficit present.     Mental Status: She is alert and oriented to person, place, and time.      UC Treatments / Results  Labs (all labs ordered are listed, but only abnormal results are displayed) Labs Reviewed  POCT RAPID STREP A (OFFICE) - Abnormal; Notable for the following components:      Result Value   Rapid Strep A Screen Positive (*)    All other components within normal limits    EKG   Radiology No results found.  Procedures Procedures (including critical care time)  Medications Ordered in UC Medications - No data to display  Initial Impression / Assessment and Plan / UC Course  I have reviewed the triage vital signs and the nursing notes.  Pertinent labs & imaging results that were available during my care of the patient were reviewed by me and considered in my medical decision making (see chart for details).     MDM: 1.  Strep pharyngitis-Rx'd amoxicillin. Advised patient to take medication as directed with food to completion.  Encouraged patient to increase daily water intake while taking this medication.  Encouraged patient to increase daily water intake while taking this medication.  Advised if symptoms worsen and/or unresolved please follow-up with PCP or here for further evaluation. Discharged home, hemodynamically stable. Final Clinical Impressions(s) / UC Diagnoses   Final diagnoses:  Strep pharyngitis     Discharge Instructions      Advised patient to take medication as directed with food to completion.  Encouraged patient to increase daily water intake while taking this medication.  Advised if symptoms worsen and/or unresolved please follow-up with PCP or here for further evaluation.     ED Prescriptions     Medication Sig Dispense Auth. Provider   amoxicillin (AMOXIL) 875 MG tablet Take 1 tablet (875 mg total) by mouth 2 (two) times daily for 7 days. 14 tablet Eliezer Lofts, FNP  PDMP not  reviewed this encounter.   Eliezer Lofts, [place] 12/15/22 1037

## 2022-12-16 ENCOUNTER — Telehealth: Payer: Self-pay | Admitting: Emergency Medicine

## 2022-12-16 NOTE — Telephone Encounter (Signed)
LMTRC.  Advised if doing well to disregard the call.  Any questions or concerns feel free to contact the office. 

## 2023-06-30 LAB — CYTOLOGY - PAP

## 2023-08-18 ENCOUNTER — Encounter: Payer: Self-pay | Admitting: Advanced Practice Midwife

## 2023-08-18 ENCOUNTER — Ambulatory Visit (INDEPENDENT_AMBULATORY_CARE_PROVIDER_SITE_OTHER): Payer: No Typology Code available for payment source | Admitting: Advanced Practice Midwife

## 2023-08-18 VITALS — BP 154/88 | HR 90 | Ht 68.0 in | Wt 176.0 lb

## 2023-08-18 DIAGNOSIS — R87610 Atypical squamous cells of undetermined significance on cytologic smear of cervix (ASC-US): Secondary | ICD-10-CM | POA: Diagnosis not present

## 2023-08-18 NOTE — Progress Notes (Signed)
[facility] Visit  Patient name: Aimee Burns MRN [id number]  Date of birth: 12-05-1990  CC & HPI:  Aimee Burns is a 32 y.o.  female presenting today for Abnormal Pap f/u.  Gets paps w/PCP, has never had an abnormal one.  PCP recommended f/u w/OB/GYN for ASCUS, neg HPV 06/25/23. States BP is very normal unless she is nervous.  Is nervous today.   Pertinent History Reviewed:  Medical & Surgical Hx:   Past Medical History:  Diagnosis Date   Nexplanon insertion 07/08/2013   Placed in left arm 07/08/13 remove 07/08/16   Pregnancy    Renal calculi    2011,right parathyroidectomy   Thyroid tumor, benign    Past Surgical History:  Procedure Laterality Date   Parathyroid gland removed     PARATHYROIDECTOMY / EXPLORATION OF PARATHYROIDS Right 2011   [place], at age18   THYROIDECTOMY, PARTIAL Right 2011   Family History  Problem Relation Age of Onset   Diabetes Paternal Grandmother    Cancer Paternal Grandmother        lung   Thyroid disease Mother    No current outpatient medications on file. Social History: Reviewed -  reports that she has never smoked. She has never used smokeless tobacco.  Review of Systems:   Constitutional: Negative for fever and chills Eyes: Negative for visual disturbances Respiratory: Negative for shortness of breath, dyspnea Cardiovascular: Negative for chest pain or palpitations  Gastrointestinal: Negative for vomiting, diarrhea and constipation; no abdominal pain Genitourinary: Negative for dysuria and urgency, vaginal irritation or itching Musculoskeletal: Negative for back pain, joint pain, myalgias  Neurological: Negative for dizziness and headaches    Objective Findings:    Physical Examination: Vitals:   08/18/23 0855 08/18/23 0905  BP: (!) 133/90 (!) 154/88  Pulse: 90    General appearance - well appearing, and in no distress Mental status - alert, oriented to person, place, and time Chest:  Normal  respiratory effort Heart - normal rate and regular rhythm Musculoskeletal:  Normal range of motion without pain Extremities:  No edema    No results found for this or any previous visit (from the past 24 hour(s)).    Assessment & Plan:  A:   ASCUS on pap, - HPV P:  2019 ASCCP guidelines recommend repeat in 3 years Pt is nervous and prefers yearly paps, Im OK with that   No follow-ups on file.  Jacklyn Shell CNM 08/18/2023 11:50 AM

## 2023-09-03 ENCOUNTER — Encounter: Payer: Self-pay | Admitting: Women's Health

## 2023-09-03 ENCOUNTER — Other Ambulatory Visit (HOSPITAL_COMMUNITY)
Admission: RE | Admit: 2023-09-03 | Discharge: 2023-09-03 | Disposition: A | Discharge status: Home / Self Care | Payer: No Typology Code available for payment source | Source: Ambulatory Visit | Attending: Women's Health | Admitting: Women's Health

## 2023-09-03 ENCOUNTER — Ambulatory Visit (INDEPENDENT_AMBULATORY_CARE_PROVIDER_SITE_OTHER): Payer: No Typology Code available for payment source | Admitting: Women's Health

## 2023-09-03 VITALS — BP 137/73 | HR 85 | Ht 67.0 in | Wt 172.0 lb

## 2023-09-03 DIAGNOSIS — R102 Pelvic and perineal pain: Secondary | ICD-10-CM

## 2023-09-03 DIAGNOSIS — N898 Other specified noninflammatory disorders of vagina: Secondary | ICD-10-CM | POA: Insufficient documentation

## 2023-09-03 DIAGNOSIS — Z3009 Encounter for other general counseling and advice on contraception: Secondary | ICD-10-CM | POA: Diagnosis not present

## 2023-09-03 DIAGNOSIS — R030 Elevated blood-pressure reading, without diagnosis of hypertension: Secondary | ICD-10-CM | POA: Diagnosis not present

## 2023-09-03 NOTE — Patient Instructions (Signed)
Salpingectomy Salpingectomy, also called tubectomy, is the surgical removal of one of the fallopian tubes. The fallopian tubes allow eggs to travel from the ovaries to the uterus. Removing one fallopian tube does not prevent pregnancy. It also does not cause problems with your menstrual periods. You may need this procedure if you: Have an ectopic pregnancy. This is when a fertilized egg attaches to the fallopian tube instead of the uterus. An ectopic pregnancy can cause the tube to burst or tear (rupture). Have an infected fallopian tube. Have cancer of the fallopian tube or nearby organs. Have had an ovary removed due to a cyst or tumor. Have had your uterus removed. Are at high risk for ovarian cancer. There are three different methods that can be used for a salpingectomy: An open method in which one large incision is made in your abdomen. A laparoscopic method in which a thin, lighted tube with a tiny camera (laparoscope) is used to help perform the procedure. The laparoscope allows a surgeon to make several small incisions in the abdomen instead of one large incision. A robot-assisted method in which a computer is used to control surgical instruments that are attached to robotic arms. Tell a health care provider about: Any allergies you have. All medicines you are taking, including vitamins, herbs, eye drops, creams, and over-the-counter medicines. Any problems you or family members have had with anesthetic medicines. Any blood disorders you have. Any surgeries you have had. Any medical conditions you have. Whether you are pregnant or may be pregnant. What are the risks? Generally, this is a safe procedure. However, problems may occur, including: Infection. Bleeding. Allergic reactions to medicines. Blood clots in the legs or lungs. Damage to nearby structures or organs. What happens before the procedure? Staying hydrated Follow instructions from your health care provider about  hydration, which may include: Up to 2 hours before the procedure - you may continue to drink clear liquids, such as water, clear fruit juice, black coffee, and plain tea. Eating and drinking restrictions Follow instructions from your health care provider about eating and drinking, which may include: 8 hours before the procedure - stop eating heavy meals or foods, such as meat, fried foods, or fatty foods. 6 hours before the procedure - stop eating light meals or foods, such as toast or cereal. 6 hours before the procedure - stop drinking milk or drinks that contain milk. 2 hours before the procedure - stop drinking clear liquids. Medicines Ask your health care provider about: Changing or stopping your regular medicines. This is especially important if you are taking diabetes medicines or blood thinners. Taking medicines such as aspirin and ibuprofen. These medicines can thin your blood. Do not take these medicines unless your health care provider tells you to take them. Taking over-the-counter medicines, vitamins, herbs, and supplements. General instructions Do not use any products that contain nicotine or tobacco for at least 4 weeks before the procedure. These products include cigarettes, chewing tobacco, and vaping devices, such as e-cigarettes. If you need help quitting, ask your health care provider. You may have an exam or tests, such as an electrocardiogram (ECG) or a blood or urine test. Ask your health care provider: How your surgery site will be marked. What steps will be taken to help prevent infection. These steps may include: Removing hair at the surgery site. Washing skin with a germ-killing soap. Taking antibiotic medicine. Plan to have a responsible adult take you home from the hospital or clinic. If you will be  going home right after the procedure, plan to have a responsible adult care for you for the time you are told. This is important. What happens during the  procedure? An IV will be inserted into one of your veins. You will be given one or both of the following: A medicine to help you relax (sedative). A medicine to make you fall asleep (general anesthetic). A small, thin tube (catheter) may be inserted through your urethra and into your bladder. This will drain urine during your procedure. Depending on the type of procedure you are having, one incision or several small incisions will be made in your abdomen. Your fallopian tube and ovary will be cut away from the uterus and removed. Your blood vessels will be clamped and tied to prevent excess bleeding. The incision or incisions in your abdomen will be closed with stitches (sutures), staples, or skin glue. A bandage (dressing) may be placed over your incision or incisions. The procedure may vary among health care providers and hospitals. What happens after the procedure?  Your blood pressure, heart rate, breathing rate, and blood oxygen level will be monitored until you leave the hospital or clinic. You may continue to receive fluids and medicines through an IV. You may continue to have a catheter draining your urine. You may have to wear compression stockings. These stockings help to prevent blood clots and reduce swelling in your legs. You will be given pain medicine as needed. If you were given a sedative during the procedure, it can affect you for several hours. Do not drive or operate machinery until your health care provider says that it is safe. Summary Salpingectomy is a surgical procedure to remove one of the fallopian tubes. The procedure may be done with an open incision, a thin, lighted tube with a tiny camera (laparoscope), or computer-controlled instruments. Depending on the type of procedure you have, one incision or several small incisions will be made in your abdomen. Your blood pressure, heart rate, breathing rate, and blood oxygen level will be monitored until you leave the  hospital or clinic. Plan to have a responsible adult take you home from the hospital or clinic. This information is not intended to replace advice given to you by your health care provider. Make sure you discuss any questions you have with your health care provider. Document Revised: 10/09/2020 Document Reviewed: 10/10/2020 Elsevier Patient Education  2024 [employer].

## 2023-09-03 NOTE — Progress Notes (Signed)
GYN VISIT Patient name: Aimee Burns MRN [id number]  Date of birth: September 01, 1991 Chief Complaint:   pelvic pressure-concerned she may have tampon inside  History of Present Illness:   Aimee Burns is a 32 y.o. [id number] Caucasian female being seen today for report of thinking something may be in vagina. Thought she may have left tampon in on Sat so has been trying to feel to see if she feels anything, had husband feel and they also had sex but he didn't feel anything. Feels pressure and irritation now, but thinks it may just be from where she's been trying to find something. Denies abnormal discharge, itching/odor. Wants testing for bv and yeast. May want BTL Reports bp's always high here Patient's last menstrual period was 08/25/2023. The current method of family planning is coitus interruptus.  Last pap 06/25/23. Results were: ASCUS w/ HRHPV negative w/ PCP     10/13/2020   10:05 AM  Depression screen PHQ 2/9  Decreased Interest 0  Down, Depressed, Hopeless 0  PHQ - 2 Score 0  Altered sleeping 0  Tired, decreased energy 0  Change in appetite 0  Feeling bad or failure about yourself  0  Trouble concentrating 0  Moving slowly or fidgety/restless 0  Suicidal thoughts 0  PHQ-9 Score 0        10/13/2020   10:06 AM  GAD 7 : Generalized Anxiety Score  Nervous, Anxious, on Edge 0  Control/stop worrying 0  Worry too much - different things 0  Trouble relaxing 0  Restless 0  Easily annoyed or irritable 0  Afraid - awful might happen 0  Total GAD 7 Score 0     Review of Systems:   Pertinent items are noted in HPI Denies fever/chills, dizziness, headaches, visual disturbances, fatigue, shortness of breath, chest pain, abdominal pain, vomiting, abnormal vaginal discharge/itching/odor/irritation, problems with periods, bowel movements, urination, or intercourse unless otherwise stated above.  Pertinent History Reviewed:  Reviewed past medical,surgical, social,  obstetrical and family history.  Reviewed problem list, medications and allergies. Physical Assessment:   Vitals:   09/03/23 0945 09/03/23 0950  BP: (!) 151/93 137/73  Pulse: 85   Weight: 172 lb (78 kg)   Height: 5\' 7"  (1.702 m)   Body mass index is 26.94 kg/m.       Physical Examination:   General appearance: alert, well appearing, and in no distress  Mental status: alert, oriented to person, place, and time  Skin: warm & dry   Cardiovascular: normal heart rate noted  Respiratory: normal respiratory effort, no distress  Abdomen: soft, non-tender   Pelvic: VULVA: normal appearing vulva with no masses, tenderness or lesions, VAGINA: normal appearing vagina with normal color and discharge, no lesions, CERVIX: normal appearing cervix without discharge or lesions, UTERUS: uterus is normal size, shape, consistency and nontender, ADNEXA: normal adnexa in size, nontender and no masses Vaginal vault completely empty of foreign objects  Extremities: no edema   Chaperone: Latisha Cresenzo    No results found for this or any previous visit (from the past 24 hour(s)).  Assessment & Plan:  1) Vaginal pressure/irritation> thought had tampon stuck, nothing on exam.CV swab for bv/yeast only requested  2) May want BTL> discussed, wants to make appt w/ MD to discuss further/possibly schedule procedure  3) ? White coat syndrome> reports bp's always high here and always normal w/ PCP, PCP is aware per pt  Meds: No orders of the defined types were placed in this encounter.  No orders of the defined types were placed in this encounter.   Return for 1st available, MD only to discuss BTL (make sure pap gets scanned in please).  Cheral Marker CNM, [facility] 09/03/2023 10:24 AM

## 2023-09-04 ENCOUNTER — Ambulatory Visit: Payer: No Typology Code available for payment source | Admitting: Obstetrics & Gynecology

## 2023-09-08 LAB — CERVICOVAGINAL ANCILLARY ONLY
Bacterial Vaginitis (gardnerella): POSITIVE — AB
Candida Glabrata: NEGATIVE
Candida Vaginitis: NEGATIVE
Comment: NEGATIVE
Comment: NEGATIVE
Comment: NEGATIVE

## 2023-09-09 ENCOUNTER — Other Ambulatory Visit: Payer: Self-pay | Admitting: Adult Health

## 2023-09-09 MED ORDER — METRONIDAZOLE 500 MG PO TABS: 500.0000 mg | ORAL_TABLET | Freq: Two times a day (BID) | ORAL | 0 refills | Status: DC

## 2023-09-09 NOTE — Progress Notes (Signed)
+

## 2023-09-10 ENCOUNTER — Ambulatory Visit (INDEPENDENT_AMBULATORY_CARE_PROVIDER_SITE_OTHER): Payer: No Typology Code available for payment source | Admitting: Obstetrics & Gynecology

## 2023-09-10 ENCOUNTER — Encounter: Payer: Self-pay | Admitting: Obstetrics & Gynecology

## 2023-09-10 VITALS — BP 146/92 | HR 88 | Ht 68.0 in | Wt 172.8 lb

## 2023-09-10 DIAGNOSIS — Z3009 Encounter for other general counseling and advice on contraception: Secondary | ICD-10-CM | POA: Diagnosis not present

## 2023-09-10 NOTE — Progress Notes (Signed)
   GYN VISIT Patient name: Aimee Burns MRN [id number]  Date of birth: 21-Jun-1991 Chief Complaint:   Pre-op Exam (Discuss BTL)  History of Present Illness:   Aimee Burns is a 32 y.o. [phone] female being seen today for the following concerns:  Contraceptive management: She has completed childbearing and desires permanent sterilization.  She wishes to continue having menses every month.  She has done some research on her own and wishes to obtain more information about BTL.  Currently using pull out method for contraception.    Menses every month- typically 5-7 day.  Denies HMB or intermenstrual bleeding.  Denies pelvic pain.  Currently being treated for BV due to vaginal irritation burning and discomfort.  Patient's last menstrual period was 08/25/2023.    Review of Systems:   Pertinent items are noted in HPI Denies fever/chills, dizziness, headaches, visual disturbances, fatigue, shortness of breath, chest pain, abdominal pain, vomiting, no problems with periods, bowel movements, urination, or intercourse unless otherwise stated above.  Pertinent History Reviewed:   Past Surgical History:  Procedure Laterality Date   Parathyroid gland removed     PARATHYROIDECTOMY / EXPLORATION OF PARATHYROIDS Right 2011   [place], at age18   THYROIDECTOMY, PARTIAL Right 2011    Past Medical History:  Diagnosis Date   Nexplanon insertion 07/08/2013   Placed in left arm 07/08/13 remove 07/08/16   Pregnancy    Renal calculi    2011,right parathyroidectomy   Thyroid tumor, benign    Vaginal Pap smear, abnormal    Reviewed problem list, medications and allergies. Physical Assessment:   Vitals:   09/10/23 1546  BP: (!) 146/92  Pulse: 88  Weight: 172 lb 12.8 oz (78.4 kg)  Height: 5\' 8"  (1.727 m)  Body mass index is 26.27 kg/m.       Physical Examination:   General appearance: alert, well appearing, and in no distress  Psych: mood appropriate, normal affect  Skin:  warm & dry   Cardiovascular: normal heart rate noted  Respiratory: normal respiratory effort, no distress  Abdomen: soft, non-tender   Pelvic: examination not indicated  Extremities: no edema   Chaperone: N/A    Assessment & Plan:  1) sterilization consult  Patient desires permanent sterilization.  Other reversible forms of contraception were discussed with patient; she declines all other modalities. Risks of procedure discussed with patient including but not limited to: risk of regret, permanence of method, bleeding, infection, and potential injury to surrounding organs.  Discussed that especially in setting of salpingectomy, risk of ectopic pregnancy low, less than 1%.  Also discussed possibility of post-tubal pain syndrome though not typical.  Discussed hospital expectations as well as recovery.  Questions/concerns were addressed and desires to proceed.  Has upcoming cruise scheduled for Nov 4 plan to schedule once she returns -surgical referral created  []  plan for Nov 20 No orders of the defined types were placed in this encounter.   Return for 2wk RN visit (self swab), TBD.   Myna Hidalgo, DO Attending Obstetrician & Gynecologist, [facility] for [employer], [facility]

## 2023-09-18 ENCOUNTER — Encounter: Payer: Self-pay | Admitting: Advanced Practice Midwife

## 2023-09-30 ENCOUNTER — Other Ambulatory Visit: Payer: No Typology Code available for payment source

## 2023-10-06 ENCOUNTER — Encounter: Payer: Self-pay | Admitting: Obstetrics & Gynecology

## 2023-10-17 NOTE — Patient Instructions (Signed)
Aimee Burns  10/17/2023     @PREFPERIOPPHARMACY @   Your procedure is scheduled on 10/22/2023.  Report to [facility] at 6:00 A.M.  Call this number if you have problems the morning of surgery:  [phone]  If you experience any cold or flu symptoms such as cough, fever, chills, shortness of breath, etc. between now and your scheduled surgery, please notify us at the above number.   Remember:  Do not eat anything after midnight.  You may drink clear liquids until 3:30 am .  Clear liquids allowed are:                    Water, Carbonated beverages (diabetics please choose diet or no sugar options), Clear Tea (No creamer, milk, or cream, including half & half and powdered creamer), Black Coffee Only (No creamer, milk or cream, including half & half and powdered creamer), and Clear Sports drink (No red color; diabetics please choose diet or no sugar options)    Take these medicines the morning of surgery with A SIP OF WATER : Prozac    Do not wear jewelry, make-up or nail polish, including gel polish,  artificial nails, or any other type of covering on natural nails (fingers and  toes).  Do not wear lotions, powders, or perfumes, or deodorant.  Do not shave 48 hours prior to surgery.  Men may shave face and neck.  Do not bring valuables to the hospital.  [facility] is not responsible for any belongings or valuables.  Contacts, dentures or bridgework may not be worn into surgery.  Leave your suitcase in the car.  After surgery it may be brought to your room.  For patients admitted to the hospital, discharge time will be determined by your treatment team.  Patients discharged the day of surgery will not be allowed to drive home.   Name and phone number of your driver:   Family Special instructions:  N/A  Please read over the following fact sheets that you were given. Care and Recovery After Surgery  Salpingectomy Salpingectomy, also called tubectomy, is the surgical  removal of one of the fallopian tubes. The fallopian tubes allow eggs to travel from the ovaries to the uterus. Removing one fallopian tube does not prevent pregnancy. It also does not cause problems with your menstrual periods. You may need this procedure if you: Have an ectopic pregnancy. This is when a fertilized egg attaches to the fallopian tube instead of the uterus. An ectopic pregnancy can cause the tube to burst or tear (rupture). Have an infected fallopian tube. Have cancer of the fallopian tube or nearby organs. Have had an ovary removed due to a cyst or tumor. Have had your uterus removed. Are at high risk for ovarian cancer. There are three different methods that can be used for a salpingectomy: An open method in which one large incision is made in your abdomen. A laparoscopic method in which a thin, lighted tube with a tiny camera (laparoscope) is used to help perform the procedure. The laparoscope allows a surgeon to make several small incisions in the abdomen instead of one large incision. A robot-assisted method in which a computer is used to control surgical instruments that are attached to robotic arms. Tell a health care provider about: Any allergies you have. All medicines you are taking, including vitamins, herbs, eye drops, creams, and over-the-counter medicines. Any problems you or family members have had with anesthetic medicines. Any blood disorders you  have. Any surgeries you have had. Any medical conditions you have. Whether you are pregnant or may be pregnant. What are the risks? Generally, this is a safe procedure. However, problems may occur, including: Infection. Bleeding. Allergic reactions to medicines. Blood clots in the legs or lungs. Damage to nearby structures or organs. What happens before the procedure? Staying hydrated Follow instructions from your health care provider about hydration, which may include: Up to 2 hours before the procedure - you  may continue to drink clear liquids, such as water, clear fruit juice, black coffee, and plain tea. Eating and drinking restrictions Follow instructions from your health care provider about eating and drinking, which may include: 8 hours before the procedure - stop eating heavy meals or foods, such as meat, fried foods, or fatty foods. 6 hours before the procedure - stop eating light meals or foods, such as toast or cereal. 6 hours before the procedure - stop drinking milk or drinks that contain milk. 2 hours before the procedure - stop drinking clear liquids. Medicines Ask your health care provider about: Changing or stopping your regular medicines. This is especially important if you are taking diabetes medicines or blood thinners. Taking medicines such as aspirin and ibuprofen. These medicines can thin your blood. Do not take these medicines unless your health care provider tells you to take them. Taking over-the-counter medicines, vitamins, herbs, and supplements. General instructions Do not use any products that contain nicotine or tobacco for at least 4 weeks before the procedure. These products include cigarettes, chewing tobacco, and vaping devices, such as e-cigarettes. If you need help quitting, ask your health care provider. You may have an exam or tests, such as an electrocardiogram (ECG) or a blood or urine test. Ask your health care provider: How your surgery site will be marked. What steps will be taken to help prevent infection. These steps may include: Removing hair at the surgery site. Washing skin with a germ-killing soap. Taking antibiotic medicine. Plan to have a responsible adult take you home from the hospital or clinic. If you will be going home right after the procedure, plan to have a responsible adult care for you for the time you are told. This is important. What happens during the procedure? An IV will be inserted into one of your veins. You will be given one or  both of the following: A medicine to help you relax (sedative). A medicine to make you fall asleep (general anesthetic). A small, thin tube (catheter) may be inserted through your urethra and into your bladder. This will drain urine during your procedure. Depending on the type of procedure you are having, one incision or several small incisions will be made in your abdomen. Your fallopian tube and ovary will be cut away from the uterus and removed. Your blood vessels will be clamped and tied to prevent excess bleeding. The incision or incisions in your abdomen will be closed with stitches (sutures), staples, or skin glue. A bandage (dressing) may be placed over your incision or incisions. The procedure may vary among health care providers and hospitals. What happens after the procedure?  Your blood pressure, heart rate, breathing rate, and blood oxygen level will be monitored until you leave the hospital or clinic. You may continue to receive fluids and medicines through an IV. You may continue to have a catheter draining your urine. You may have to wear compression stockings. These stockings help to prevent blood clots and reduce swelling in your legs. You will  be given pain medicine as needed. If you were given a sedative during the procedure, it can affect you for several hours. Do not drive or operate machinery until your health care provider says that it is safe. Summary Salpingectomy is a surgical procedure to remove one of the fallopian tubes. The procedure may be done with an open incision, a thin, lighted tube with a tiny camera (laparoscope), or computer-controlled instruments. Depending on the type of procedure you have, one incision or several small incisions will be made in your abdomen. Your blood pressure, heart rate, breathing rate, and blood oxygen level will be monitored until you leave the hospital or clinic. Plan to have a responsible adult take you home from the hospital  or clinic. This information is not intended to replace advice given to you by your health care provider. Make sure you discuss any questions you have with your health care provider. Document Revised: 10/09/2020 Document Reviewed: 10/10/2020 Elsevier Patient Education  2024 [employer]  General Anesthesia, Adult General anesthesia is the use of medicine to make you fall asleep (unconscious) for a medical procedure. General anesthesia must be used for certain procedures. It is often recommended for surgery or procedures that: Last a long time. Require you to be still or in an unusual position. Are major and can cause blood loss. Affect your breathing. The medicines used for general anesthesia are called general anesthetics. During general anesthesia, these medicines are given along with medicines that: Prevent pain. Control your blood pressure. Relax your muscles. Prevent nausea and vomiting after the procedure. Tell a health care provider about: Any allergies you have. All medicines you are taking, including vitamins, herbs, eye drops, creams, and over-the-counter medicines. Your history of any: Medical conditions you have, including: High blood pressure. Bleeding problems. Diabetes. Heart or lung conditions, such as: Heart failure. Sleep apnea. Asthma. Chronic obstructive pulmonary disease (COPD). Current or recent illnesses, such as: Upper respiratory, chest, or ear infections. Cough or fever. Tobacco or drug use, including marijuana or alcohol use. Depression or anxiety. Surgeries and types of anesthetics you have had. Problems you or family members have had with anesthetic medicines. Whether you are pregnant or may be pregnant. Whether you have any chipped or loose teeth, dentures, caps, bridgework, or issues with your mouth, swallowing, or choking. What are the risks? Your health care provider will talk with you about risks. These may include: Allergic reaction to the  medicines. Lung and heart problems. Inhaling food or liquid from the stomach into the lungs (aspiration). Nerve injury. Injury to the lips, mouth, teeth, or gums. Stroke. Waking up during your procedure and being unable to move. This is rare. These problems are more likely to develop if you are having a major surgery or if you have an advanced or serious medical condition. You can prevent some of these complications by answering all of your health care provider's questions thoroughly and by following all instructions before your procedure. General anesthesia can cause side effects, including: Nausea or vomiting. A sore throat or hoarseness from the breathing tube. Wheezing or coughing. Shaking chills or feeling cold. Body aches. Sleepiness. Confusion, agitation (delirium), or anxiety. What happens before the procedure? When to stop eating and drinking Follow instructions from your health care provider about what you may eat and drink before your procedure. If you do not follow your health care provider's instructions, your procedure may be delayed or canceled. Medicines Ask your health care provider about: Changing or stopping your regular medicines.  These include any diabetes medicines or blood thinners you take. Taking medicines such as aspirin and ibuprofen. These medicines can thin your blood. Do not take them unless your health care provider tells you to. Taking over-the-counter medicines, vitamins, herbs, and supplements. General instructions Do not use any products that contain nicotine or tobacco for at least 4 weeks before the procedure. These products include cigarettes, chewing tobacco, and vaping devices, such as e-cigarettes. If you need help quitting, ask your health care provider. If you brush your teeth on the morning of the procedure, make sure to spit out all of the water and toothpaste. If told by your health care provider, bring your sleep apnea device with you to  surgery (if applicable). If you will be going home right after the procedure, plan to have a responsible adult: Take you home from the hospital or clinic. You will not be allowed to drive. Care for you for the time you are told. What happens during the procedure?  An IV will be inserted into one of your veins. You will be given one or more of the following through a face mask or IV: A sedative. This helps you relax. Anesthesia. This will: Numb certain areas of your body. Make you fall asleep for surgery. After you are unconscious, a breathing tube may be inserted down your throat to help you breathe. This will be removed before you wake up. An anesthesia provider, such as an anesthesiologist, will stay with you throughout your procedure. The anesthesia provider will: Keep you comfortable and safe by continuing to give you medicines and adjusting the amount of medicine that you get. Monitor your blood pressure, heart rate, and oxygen levels to make sure that the anesthetics do not cause any problems. The procedure may vary among health care providers and hospitals. What happens after the procedure? Your blood pressure, temperature, heart rate, breathing rate, and blood oxygen level will be monitored until you leave the hospital or clinic. You will wake up in a recovery area. You may wake up slowly. You may be given medicine to help you with pain, nausea, or any other side effects from the anesthesia. Summary General anesthesia is the use of medicine to make you fall asleep (unconscious) for a medical procedure. Follow your health care provider's instructions about when to stop eating, drinking, or taking certain medicines before your procedure. Plan to have a responsible adult take you home from the hospital or clinic. This information is not intended to replace advice given to you by your health care provider. Make sure you discuss any questions you have with your health care  provider. Document Revised: 02/14/2022 Document Reviewed: 02/14/2022 Elsevier Patient Education  2024 Else[employer] Inc.  How to Use Chlorhexidine Before Surgery Chlorhexidine gluconate (CHG) is a germ-killing (antiseptic) solution that is used to clean the skin. It can get rid of the bacteria that normally live on the skin and can keep them away for about 24 hours. To clean your skin with CHG, you may be given: A CHG solution to use in the shower or as part of a sponge bath. A prepackaged cloth that contains CHG. Cleaning your skin with CHG may help lower the risk for infection: While you are staying in the intensive care unit of the hospital. If you have a vascular access, such as a central line, to provide short-term or long-term access to your veins. If you have a catheter to drain urine from your bladder. If you are on a ventilator.  A ventilator is a machine that helps you breathe by moving air in and out of your lungs. After surgery. What are the risks? Risks of using CHG include: A skin reaction. Hearing loss, if CHG gets in your ears and you have a perforated eardrum. Eye injury, if CHG gets in your eyes and is not rinsed out. The CHG product catching fire. Make sure that you avoid smoking and flames after applying CHG to your skin. Do not use CHG: If you have a chlorhexidine allergy or have previously reacted to chlorhexidine. On babies younger than 28 months of age. How to use CHG solution Use CHG only as told by your health care provider, and follow the instructions on the label. Use the full amount of CHG as directed. Usually, this is one bottle. During a shower Follow these steps when using CHG solution during a shower (unless your health care provider gives you different instructions): Start the shower. Use your normal soap and shampoo to wash your face and hair. Turn off the shower or move out of the shower stream. Pour the CHG onto a clean washcloth. Do not use any type of  brush or rough-edged sponge. Starting at your neck, lather your body down to your toes. Make sure you follow these instructions: If you will be having surgery, pay special attention to the part of your body where you will be having surgery. Scrub this area for at least 1 minute. Do not use CHG on your head or face. If the solution gets into your ears or eyes, rinse them well with water. Avoid your genital area. Avoid any areas of skin that have broken skin, cuts, or scrapes. Scrub your back and under your arms. Make sure to wash skin folds. Let the lather sit on your skin for 1-2 minutes or as long as told by your health care provider. Thoroughly rinse your entire body in the shower. Make sure that all body creases and crevices are rinsed well. Dry off with a clean towel. Do not put any substances on your body afterward--such as powder, lotion, or perfume--unless you are told to do so by your health care provider. Only use lotions that are recommended by the manufacturer. Put on clean clothes or pajamas. If it is the night before your surgery, sleep in clean sheets.  During a sponge bath Follow these steps when using CHG solution during a sponge bath (unless your health care provider gives you different instructions): Use your normal soap and shampoo to wash your face and hair. Pour the CHG onto a clean washcloth. Starting at your neck, lather your body down to your toes. Make sure you follow these instructions: If you will be having surgery, pay special attention to the part of your body where you will be having surgery. Scrub this area for at least 1 minute. Do not use CHG on your head or face. If the solution gets into your ears or eyes, rinse them well with water. Avoid your genital area. Avoid any areas of skin that have broken skin, cuts, or scrapes. Scrub your back and under your arms. Make sure to wash skin folds. Let the lather sit on your skin for 1-2 minutes or as long as told by  your health care provider. Using a different clean, wet washcloth, thoroughly rinse your entire body. Make sure that all body creases and crevices are rinsed well. Dry off with a clean towel. Do not put any substances on your body afterward--such as powder,  lotion, or perfume--unless you are told to do so by your health care provider. Only use lotions that are recommended by the manufacturer. Put on clean clothes or pajamas. If it is the night before your surgery, sleep in clean sheets. How to use CHG prepackaged cloths Only use CHG cloths as told by your health care provider, and follow the instructions on the label. Use the CHG cloth on clean, dry skin. Do not use the CHG cloth on your head or face unless your health care provider tells you to. When washing with the CHG cloth: Avoid your genital area. Avoid any areas of skin that have broken skin, cuts, or scrapes. Before surgery Follow these steps when using a CHG cloth to clean before surgery (unless your health care provider gives you different instructions): Using the CHG cloth, vigorously scrub the part of your body where you will be having surgery. Scrub using a back-and-forth motion for 3 minutes. The area on your body should be completely wet with CHG when you are done scrubbing. Do not rinse. Discard the cloth and let the area air-dry. Do not put any substances on the area afterward, such as powder, lotion, or perfume. Put on clean clothes or pajamas. If it is the night before your surgery, sleep in clean sheets.  For general bathing Follow these steps when using CHG cloths for general bathing (unless your health care provider gives you different instructions). Use a separate CHG cloth for each area of your body. Make sure you wash between any folds of skin and between your fingers and toes. Wash your body in the following order, switching to a new cloth after each step: The front of your neck, shoulders, and chest. Both of your  arms, under your arms, and your hands. Your stomach and groin area, avoiding the genitals. Your right leg and foot. Your left leg and foot. The back of your neck, your back, and your buttocks. Do not rinse. Discard the cloth and let the area air-dry. Do not put any substances on your body afterward--such as powder, lotion, or perfume--unless you are told to do so by your health care provider. Only use lotions that are recommended by the manufacturer. Put on clean clothes or pajamas. Contact a health care provider if: Your skin gets irritated after scrubbing. You have questions about using your solution or cloth. You swallow any chlorhexidine. Call your local poison control center [phone] in the [place] Get help right away if: Your eyes itch badly, or they become very red or swollen. Your skin itches badly and is red or swollen. Your hearing changes. You have trouble seeing. You have swelling or tingling in your mouth or throat. You have trouble breathing. These symptoms may represent a serious problem that is an emergency. Do not wait to see if the symptoms will go away. Get medical help right away. Call your local emergency services (911 in the [place] Do not drive yourself to the hospital. Summary Chlorhexidine gluconate (CHG) is a germ-killing (antiseptic) solution that is used to clean the skin. Cleaning your skin with CHG may help to lower your risk for infection. You may be given CHG to use for bathing. It may be in a bottle or in a prepackaged cloth to use on your skin. Carefully follow your health care provider's instructions and the instructions on the product label. Do not use CHG if you have a chlorhexidine allergy. Contact your health care provider if your skin gets irritated after scrubbing. This information  is not intended to replace advice given to you by your health care provider. Make sure you discuss any questions you have with your health care provider. Document  Revised: 03/18/2022 Document Reviewed: 01/29/2021 Elsevier Patient Education  2023 [employer].

## 2023-10-20 ENCOUNTER — Encounter (HOSPITAL_COMMUNITY)
Admission: RE | Admit: 2023-10-20 | Discharge: 2023-10-20 | Disposition: A | Discharge status: Home / Self Care | Payer: No Typology Code available for payment source | Source: Ambulatory Visit | Attending: Obstetrics & Gynecology | Admitting: Obstetrics & Gynecology

## 2023-10-20 NOTE — Progress Notes (Signed)
Patient called to cancel procedure for Laparoscopic Bilateral Salpingectomy on 10/22/23 due to insurance coverage issues.

## 2023-10-22 ENCOUNTER — Encounter (HOSPITAL_COMMUNITY): Admission: RE | Payer: Self-pay | Source: Home / Self Care

## 2023-10-22 ENCOUNTER — Ambulatory Visit (HOSPITAL_COMMUNITY)
Admission: RE | Admit: 2023-10-22 | Payer: No Typology Code available for payment source | Source: Home / Self Care | Admitting: Obstetrics & Gynecology

## 2023-10-22 DIAGNOSIS — Z302 Encounter for sterilization: Secondary | ICD-10-CM

## 2023-10-22 DIAGNOSIS — Z01818 Encounter for other preprocedural examination: Secondary | ICD-10-CM

## 2023-10-22 SURGERY — SALPINGECTOMY, BILATERAL, LAPAROSCOPIC
Anesthesia: General | Laterality: Bilateral

## 2023-10-28 ENCOUNTER — Encounter: Payer: No Typology Code available for payment source | Admitting: Obstetrics & Gynecology

## 2023-11-12 ENCOUNTER — Other Ambulatory Visit: Payer: Self-pay | Admitting: Obstetrics & Gynecology

## 2023-11-12 DIAGNOSIS — O3680X0 Pregnancy with inconclusive fetal viability, not applicable or unspecified: Secondary | ICD-10-CM

## 2023-11-17 ENCOUNTER — Ambulatory Visit (INDEPENDENT_AMBULATORY_CARE_PROVIDER_SITE_OTHER): Payer: Self-pay | Admitting: Radiology

## 2023-11-17 ENCOUNTER — Encounter: Payer: Self-pay | Admitting: Radiology

## 2023-11-17 DIAGNOSIS — Z3A01 Less than 8 weeks gestation of pregnancy: Secondary | ICD-10-CM

## 2023-11-17 DIAGNOSIS — O3680X0 Pregnancy with inconclusive fetal viability, not applicable or unspecified: Secondary | ICD-10-CM

## 2023-11-17 DIAGNOSIS — Z3491 Encounter for supervision of normal pregnancy, unspecified, first trimester: Secondary | ICD-10-CM

## 2023-11-17 NOTE — Progress Notes (Signed)
Known LMP = 09/24/23   =  7+5 weeks  Retroverted Uterus with single viable early IUP,  CRL = 15.5 mm = 8+0 weeks  FHR = 176 bpm Normal yolk sac seen,  normal ovaries, Left ov with CL = 21 mm, neg and regions Cervix closed - neg CDS - no free fluid

## 2023-12-03 NOTE — L&D Delivery Note (Signed)
 [facility] Delivery Note  Aimee Burns is a 32 y.o. [phone] s/p SVD at [redacted]w[redacted]d. She was admitted for SOL.   ROM: 1h 20m with clear fluid GBS Status:  Positive/-- (07/09 1530), only 1 dose of PCN at 2026 Maximum Maternal Temperature: 98.58F  Labor Progress: Initial SVE: 6.5/80/-1. She SROM'ed. She then progressed to complete.   Delivery Date/Time: 2205 7/28 Delivery: Called to room and patient was complete and pushing. Head delivered ROP. Nuchal cord x3 present, loose and easily reduced at perineum. Shoulder and body delivered in usual fashion. Infant with spontaneous cry, placed on mother's abdomen, dried and stimulated. Cord clamped x 2 after 1-minute delay, and cut by FOB. Cord blood drawn. Placenta delivered spontaneously with gentle cord traction. Fundus firm with massage, lower uterine sweep, and draining of 150cc of urine with straight cath, TXA and Pitocin . Labia, perineum, vagina, and cervix inspected without laceration.   Baby Weight: pending  Placenta: 3 vessel, intact. Sent to L&D Complications: None Lacerations: None EBL: 260 mL Analgesia: None   Infant:  APGAR (1 MIN):  8 APGAR (5 MINS):  9  Augustin JAYSON Slade, MD [facility] Fellow, [facility] for [facility], [facility] 06/28/2024, 10:28 PM

## 2023-12-12 ENCOUNTER — Other Ambulatory Visit: Payer: Self-pay | Admitting: Obstetrics & Gynecology

## 2023-12-12 DIAGNOSIS — Z3490 Encounter for supervision of normal pregnancy, unspecified, unspecified trimester: Secondary | ICD-10-CM | POA: Insufficient documentation

## 2023-12-12 DIAGNOSIS — Z3682 Encounter for antenatal screening for nuchal translucency: Secondary | ICD-10-CM

## 2023-12-16 ENCOUNTER — Ambulatory Visit (INDEPENDENT_AMBULATORY_CARE_PROVIDER_SITE_OTHER): Payer: No Typology Code available for payment source | Admitting: Women's Health

## 2023-12-16 ENCOUNTER — Ambulatory Visit: Payer: No Typology Code available for payment source

## 2023-12-16 ENCOUNTER — Encounter: Payer: Self-pay | Admitting: Women's Health

## 2023-12-16 ENCOUNTER — Other Ambulatory Visit (HOSPITAL_COMMUNITY)
Admission: RE | Admit: 2023-12-16 | Discharge: 2023-12-16 | Disposition: A | Discharge status: Home / Self Care | Payer: No Typology Code available for payment source | Source: Ambulatory Visit | Attending: Women's Health | Admitting: Women's Health

## 2023-12-16 ENCOUNTER — Encounter: Payer: No Typology Code available for payment source | Admitting: *Deleted

## 2023-12-16 VITALS — BP 128/78 | HR 78 | Wt 176.6 lb

## 2023-12-16 DIAGNOSIS — Z131 Encounter for screening for diabetes mellitus: Secondary | ICD-10-CM

## 2023-12-16 DIAGNOSIS — N898 Other specified noninflammatory disorders of vagina: Secondary | ICD-10-CM | POA: Diagnosis present

## 2023-12-16 DIAGNOSIS — Z3A11 11 weeks gestation of pregnancy: Secondary | ICD-10-CM

## 2023-12-16 DIAGNOSIS — Z3481 Encounter for supervision of other normal pregnancy, first trimester: Secondary | ICD-10-CM | POA: Diagnosis not present

## 2023-12-16 DIAGNOSIS — Z3682 Encounter for antenatal screening for nuchal translucency: Secondary | ICD-10-CM

## 2023-12-16 DIAGNOSIS — Z3480 Encounter for supervision of other normal pregnancy, unspecified trimester: Secondary | ICD-10-CM

## 2023-12-16 DIAGNOSIS — Z6826 Body mass index (BMI) 26.0-26.9, adult: Secondary | ICD-10-CM | POA: Diagnosis not present

## 2023-12-16 DIAGNOSIS — Z86018 Personal history of other benign neoplasm: Secondary | ICD-10-CM

## 2023-12-16 NOTE — Progress Notes (Signed)
 [place] 11+6 wks,measurements c/w dates,CRL 58.68 mm,normal ovaries,FHR 169 bpm,NT 1.2 mm,NB present,left lateral placenta

## 2023-12-16 NOTE — Progress Notes (Signed)
 INITIAL OBSTETRICAL VISIT Patient name: Aimee Burns MRN [id number]  Date of birth: 1991/04/25 Chief Complaint:   Initial Prenatal Visit (Vaginal discomfort, odor)  History of Present Illness:   Aimee Burns is a 33 y.o. 670 080 2013 Caucasian female at [redacted]w[redacted]d by LMP c/w u/s at 8 weeks with an Estimated Date of Delivery: 06/30/24 being seen today for her initial obstetrical visit.   Patient's last menstrual period was 09/24/2023 (exact date). Her obstetrical history is significant for  term SVB x 3, 1st w/ FGR .   Today she reports  vaginal odor, feels like gets BV then yeast then possibly UTIs, has been showering multiple times daily .  Last pap 06/25/23. Results were: ASCUS w/ HRHPV negative     12/16/2023    1:49 PM 10/13/2020   10:05 AM  Depression screen PHQ 2/9  Decreased Interest 0 0  Down, Depressed, Hopeless 0 0  PHQ - 2 Score 0 0  Altered sleeping 0 0  Tired, decreased energy 0 0  Change in appetite 0 0  Feeling bad or failure about yourself  0 0  Trouble concentrating 0 0  Moving slowly or fidgety/restless 0 0  Suicidal thoughts 0 0  PHQ-9 Score 0 0        12/16/2023    1:49 PM 10/13/2020   10:06 AM  GAD 7 : Generalized Anxiety Score  Nervous, Anxious, on Edge 0 0  Control/stop worrying 0 0  Worry too much - different things 0 0  Trouble relaxing 0 0  Restless 0 0  Easily annoyed or irritable 0 0  Afraid - awful might happen 0 0  Total GAD 7 Score 0 0     Review of Systems:   Pertinent items are noted in HPI Denies cramping/contractions, leakage of fluid, vaginal bleeding, abnormal vaginal discharge w/ itching/odor/irritation, headaches, visual changes, shortness of breath, chest pain, abdominal pain, severe nausea/vomiting, or problems with urination or bowel movements unless otherwise stated above.  Pertinent History Reviewed:  Reviewed past medical,surgical, social, obstetrical and family history.  Reviewed problem list, medications and  allergies. OB History  Gravida Para Term Preterm AB Living  4 3 3   3   SAB IAB Ectopic Multiple Live Births     0 3    # Outcome Date GA Lbr Len/2nd Weight Sex Type Anes PTL Lv  4 Current           3 Term 04/17/21 [redacted]w[redacted]d 05:27 / 01:28 8 lb 6.2 oz (3.805 kg) M Vag-Spont EPI  LIV  2 Term 03/06/17 [redacted]w[redacted]d / 01:15 8 lb 3 oz (3.714 kg) M Vag-Spont EPI  LIV  1 Term 05/17/13 [redacted]w[redacted]d 22:15 / 00:39 5 lb 8.2 oz (2.5 kg) M Vag-Vacuum EPI  LIV   Physical Assessment:   Vitals:   12/16/23 1419  BP: 128/78  Pulse: 78  Weight: 176 lb 9.6 oz (80.1 kg)  Body mass index is 26.85 kg/m.       Physical Examination:  General appearance - well appearing, and in no distress  Mental status - alert, oriented to person, place, and time  Psych:  She has a normal mood and affect  Skin - warm and dry, normal color, no suspicious lesions noted  Chest - effort normal, all lung fields clear to auscultation bilaterally  Heart - normal rate and regular rhythm  Abdomen - soft, nontender  Extremities:  No swelling or varicosities noted  Pelvic - VULVA: normal appearing vulva with no masses,  tenderness or lesions  VAGINA: normal appearing vagina with normal color and discharge, no lesions  CERVIX: normal appearing cervix without discharge or lesions, no CMT  Thin prep pap is not done   Chaperone: Peggy Dones    TODAY'S NT US  11+6 wks,measurements c/w dates,CRL 58.68 mm,NT 1.2 mm,NB present,normal ovaries,FHR 169 bpm   No results found for this or any previous visit (from the past 24 hours).  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   Initial labs obtained Continue prenatal vitamins Reviewed n/v relief measures and  warning s/s to report Reviewed recommended weight gain based on pre-gravid BMI Encouraged well-balanced diet Genetic & carrier screening discussed: requests Panorama, NT/IT, and Horizon  Ultrasound discussed; fetal survey: requested CCNC completed> form faxed if has or is planning to apply for medicaid The nature of [facility] [employer] [facility] with multiple MDs and other Advanced Practice Providers was explained to patient; also emphasized that fellows, residents, and students are part of our team. Does have home bp cuff. Office bp cuff given: no. Rx sent: n/a. Check bp weekly, let us  know if consistently >140/90.   Follow-up: Return in about 4 weeks (around 01/13/2024) for LROB, 2nd IT, CNM, in person; then 8wks from now anatomy u/s and LROB w/ CNM.   Orders Placed This Encounter  Procedures   Urine Culture   Integrated 1   PANORAMA PRENATAL TEST   Hemoglobin A1c   CBC/D/Plt+RPR+Rh+ABO+RubIgG...   HORIZON CUSTOM   TSH    Suzen JONELLE Fetters CNM, [facility] 12/16/2023 2:53 PM

## 2023-12-16 NOTE — Patient Instructions (Signed)
Aimee Burns, thank you for choosing our office today! We appreciate the opportunity to meet your healthcare needs. You may receive a short survey by mail, e-mail, or through MyChart. If you are happy with your care we would appreciate if you could take just a few minutes to complete the survey questions. We read all of your comments and take your feedback very seriously. Thank you again for choosing our office.  [facility] ([address] [place], [place] [address]) Entrance C, located off of [address] Free 24/7 valet parking   Nausea & Vomiting Have saltine crackers or pretzels by your bed and eat a few bites before you raise your head out of bed in the morning Eat small frequent meals throughout the day instead of large meals Drink plenty of fluids throughout the day to stay hydrated, just don't drink a lot of fluids with your meals.  This can make your stomach fill up faster making you feel sick Do not brush your teeth right after you eat Products with real ginger are good for nausea, like ginger ale and ginger hard candy Make sure it says made with real ginger! Sucking on sour candy like lemon heads is also good for nausea If your prenatal vitamins make you nauseated, take them at night so you will sleep through the nausea Sea Bands If you feel like you need medicine for the nausea & vomiting please let us know If you are unable to keep any fluids or food down please let us know   Constipation Drink plenty of fluid, preferably water, throughout the day Eat foods high in fiber such as fruits, vegetables, and grains Exercise, such as walking, is a good way to keep your bowels regular Drink warm fluids, especially warm prune juice, or decaf coffee Eat a 1/2 cup of real oatmeal (not instant), 1/2 cup applesauce, and 1/2-1 cup warm prune juice every day If needed, you may take Colace (docusate sodium) stool  softener once or twice a day to help keep the stool soft.  If you still are having problems with constipation, you may take Miralax once daily as needed to help keep your bowels regular.   Home Blood Pressure Monitoring for Patients   Your provider has recommended that you check your blood pressure (BP) at least once a week at home. If you do not have a blood pressure cuff at home, one will be provided for you. Contact your provider if you have not received your monitor within 1 week.   Helpful Tips for Accurate Home Blood Pressure Checks  Don't smoke, exercise, or drink caffeine 30 minutes before checking your BP Use the restroom before checking your BP (a full bladder can raise your pressure) Relax in a comfortable upright chair Feet on the ground Left arm resting comfortably on a flat surface at the level of your heart Legs uncrossed Back supported Sit quietly and don't talk Place the cuff on your bare arm Adjust snuggly, so that only two fingertips can fit between your skin and the top of the cuff Check 2 readings separated by at least one minute Keep a log of your BP readings For a visual, please reference this diagram: http://ccnc.care/bpdiagram  Provider Name: [facility]     Phone: [phone]  Zone 1: ALL CLEAR  Continue to monitor your symptoms:  BP reading is less than 140 (top number) or less than 90 (bottom   number)  °No right upper stomach pain °No headaches or seeing spots °No feeling nauseated or throwing up °No swelling in face and hands ° °Zone 2: CAUTION °Call your doctor's office for any of the following:  °BP reading is greater than 140 (top number) or greater than 90 (bottom number)  °Stomach pain under your ribs in the middle or right side °Headaches or seeing spots °Feeling nauseated or throwing up °Swelling in face and hands ° °Zone 3: EMERGENCY  °Seek immediate medical care if you have any of the following:  °BP reading is greater than160 (top number) or  greater than 110 (bottom number) °Severe headaches not improving with Tylenol °Serious difficulty catching your breath °Any worsening symptoms from Zone 2  ° ° First Trimester of Pregnancy °The first trimester of pregnancy is from week 1 until the end of week 12 (months 1 through 3). A week after a sperm fertilizes an egg, the egg will implant on the wall of the uterus. This embryo will begin to develop into a baby. Genes from you and your partner are forming the baby. The female genes determine whether the baby is a boy or a girl. At 6-8 weeks, the eyes and face are formed, and the heartbeat can be seen on ultrasound. At the end of 12 weeks, all the baby's organs are formed.  °Now that you are pregnant, you will want to do everything you can to have a healthy baby. Two of the most important things are to get good prenatal care and to follow your health care provider's instructions. Prenatal care is all the medical care you receive before the baby's birth. This care will help prevent, find, and treat any problems during the pregnancy and childbirth. °BODY CHANGES °Your body goes through many changes during pregnancy. The changes vary from woman to woman.  °You may gain or lose a couple of pounds at first. °You may feel sick to your stomach (nauseous) and throw up (vomit). If the vomiting is uncontrollable, call your health care provider. °You may tire easily. °You may develop headaches that can be relieved by medicines approved by your health care provider. °You may urinate more often. Painful urination may mean you have a bladder infection. °You may develop heartburn as a result of your pregnancy. °You may develop constipation because certain hormones are causing the muscles that push waste through your intestines to slow down. °You may develop hemorrhoids or swollen, bulging veins (varicose veins). °Your breasts may begin to grow larger and become tender. Your nipples may stick out more, and the tissue that  surrounds them (areola) may become darker. °Your gums may bleed and may be sensitive to brushing and flossing. °Dark spots or blotches (chloasma, mask of pregnancy) may develop on your face. This will likely fade after the baby is born. °Your menstrual periods will stop. °You may have a loss of appetite. °You may develop cravings for certain kinds of food. °You may have changes in your emotions from day to day, such as being excited to be pregnant or being concerned that something may go wrong with the pregnancy and baby. °You may have more vivid and strange dreams. °You may have changes in your hair. These can include thickening of your hair, rapid growth, and changes in texture. Some women also have hair loss during or after pregnancy, or hair that feels dry or thin. Your hair will most likely return to normal after your baby is born. °WHAT TO EXPECT AT YOUR PRENATAL   VISITS °During a routine prenatal visit: °You will be weighed to make sure you and the baby are growing normally. °Your blood pressure will be taken. °Your abdomen will be measured to track your baby's growth. °The fetal heartbeat will be listened to starting around week 10 or 12 of your pregnancy. °Test results from any previous visits will be discussed. °Your health care provider may ask you: °How you are feeling. °If you are feeling the baby move. °If you have had any abnormal symptoms, such as leaking fluid, bleeding, severe headaches, or abdominal cramping. °If you have any questions. °Other tests that may be performed during your first trimester include: °Blood tests to find your blood type and to check for the presence of any previous infections. They will also be used to check for low iron levels (anemia) and Rh antibodies. Later in the pregnancy, blood tests for diabetes will be done along with other tests if problems develop. °Urine tests to check for infections, diabetes, or protein in the urine. °An ultrasound to confirm the proper growth  and development of the baby. °An amniocentesis to check for possible genetic problems. °Fetal screens for spina bifida and Down syndrome. °You may need other tests to make sure you and the baby are doing well. °HOME CARE INSTRUCTIONS  °Medicines °Follow your health care provider's instructions regarding medicine use. Specific medicines may be either safe or unsafe to take during pregnancy. °Take your prenatal vitamins as directed. °If you develop constipation, try taking a stool softener if your health care provider approves. °Diet °Eat regular, well-balanced meals. Choose a variety of foods, such as meat or vegetable-based protein, fish, milk and low-fat dairy products, vegetables, fruits, and whole grain breads and cereals. Your health care provider will help you determine the amount of weight gain that is right for you. °Avoid raw meat and uncooked cheese. These carry germs that can cause birth defects in the baby. °Eating four or five small meals rather than three large meals a day may help relieve nausea and vomiting. If you start to feel nauseous, eating a few soda crackers can be helpful. Drinking liquids between meals instead of during meals also seems to help nausea and vomiting. °If you develop constipation, eat more high-fiber foods, such as fresh vegetables or fruit and whole grains. Drink enough fluids to keep your urine clear or pale yellow. °Activity and Exercise °Exercise only as directed by your health care provider. Exercising will help you: °Control your weight. °Stay in shape. °Be prepared for labor and delivery. °Experiencing pain or cramping in the lower abdomen or low back is a good sign that you should stop exercising. Check with your health care provider before continuing normal exercises. °Try to avoid standing for long periods of time. Move your legs often if you must stand in one place for a long time. °Avoid heavy lifting. °Wear low-heeled shoes, and practice good posture. °You may  continue to have sex unless your health care provider directs you otherwise. °Relief of Pain or Discomfort °Wear a good support bra for breast tenderness.   °Take warm sitz baths to soothe any pain or discomfort caused by hemorrhoids. Use hemorrhoid cream if your health care provider approves.   °Rest with your legs elevated if you have leg cramps or low back pain. °If you develop varicose veins in your legs, wear support hose. Elevate your feet for 15 minutes, 3-4 times a day. Limit salt in your diet. °Prenatal Care °Schedule your prenatal visits by the   twelfth week of pregnancy. They are usually scheduled monthly at first, then more often in the last 2 months before delivery. °Write down your questions. Take them to your prenatal visits. °Keep all your prenatal visits as directed by your health care provider. °Safety °Wear your seat belt at all times when driving. °Make a list of emergency phone numbers, including numbers for family, friends, the hospital, and police and fire departments. °General Tips °Ask your health care provider for a referral to a local prenatal education class. Begin classes no later than at the beginning of month 6 of your pregnancy. °Ask for help if you have counseling or nutritional needs during pregnancy. Your health care provider can offer advice or refer you to specialists for help with various needs. °Do not use hot tubs, steam rooms, or saunas. °Do not douche or use tampons or scented sanitary pads. °Do not cross your legs for long periods of time. °Avoid cat litter boxes and soil used by cats. These carry germs that can cause birth defects in the baby and possibly loss of the fetus by miscarriage or stillbirth. °Avoid all smoking, herbs, alcohol, and medicines not prescribed by your health care provider. Chemicals in these affect the formation and growth of the baby. °Schedule a dentist appointment. At home, brush your teeth with a soft toothbrush and be gentle when you floss. °SEEK  MEDICAL CARE IF:  °You have dizziness. °You have mild pelvic cramps, pelvic pressure, or nagging pain in the abdominal area. °You have persistent nausea, vomiting, or diarrhea. °You have a bad smelling vaginal discharge. °You have pain with urination. °You notice increased swelling in your face, hands, legs, or ankles. °SEEK IMMEDIATE MEDICAL CARE IF:  °You have a fever. °You are leaking fluid from your vagina. °You have spotting or bleeding from your vagina. °You have severe abdominal cramping or pain. °You have rapid weight gain or loss. °You vomit blood or material that looks like coffee grounds. °You are exposed to German measles and have never had them. °You are exposed to fifth disease or chickenpox. °You develop a severe headache. °You have shortness of breath. °You have any kind of trauma, such as from a fall or a car accident. °Document Released: 11/12/2001 Document Revised: 04/04/2014 Document Reviewed: 09/28/2013 °[employer]® Patient Information ©2015 [employer]. This information is not intended to replace advice given to you by your health care provider. Make sure you discuss any questions you have with your health care provider.  °

## 2023-12-17 LAB — TSH: TSH: 1.42 u[IU]/mL (ref 0.450–4.500)

## 2023-12-18 LAB — URINE CULTURE: Organism ID, Bacteria: NO GROWTH

## 2023-12-18 LAB — CERVICOVAGINAL ANCILLARY ONLY
Bacterial Vaginitis (gardnerella): NEGATIVE
Candida Glabrata: NEGATIVE
Candida Vaginitis: NEGATIVE
Chlamydia: NEGATIVE
Comment: NEGATIVE
Comment: NEGATIVE
Comment: NEGATIVE
Comment: NEGATIVE
Comment: NEGATIVE
Comment: NORMAL
Neisseria Gonorrhea: NEGATIVE
Trichomonas: NEGATIVE

## 2023-12-19 LAB — INTEGRATED 1
Crown Rump Length: 58.7 mm
Gest. Age on Collection Date: 12.1 wk
Maternal Age at EDD: 30.8 a
Nuchal Translucency (NT): 1.2 mm
Number of Fetuses: 1
PAPP-A Value: 334.3 ng/mL
Sonographer ID#: 309760
Weight: 176 [lb_av]

## 2023-12-19 LAB — HCV INTERPRETATION

## 2023-12-19 LAB — CBC/D/PLT+RPR+RH+ABO+RUBIGG...
Antibody Screen: NEGATIVE
Basophils Absolute: 0 10*3/uL (ref 0.0–0.2)
Basos: 1 %
EOS (ABSOLUTE): 0.1 10*3/uL (ref 0.0–0.4)
Eos: 2 %
HCV Ab: NONREACTIVE
HIV Screen 4th Generation wRfx: NONREACTIVE
Hematocrit: 35 % (ref 34.0–46.6)
Hemoglobin: 11.5 g/dL (ref 11.1–15.9)
Hepatitis B Surface Ag: NEGATIVE
Immature Grans (Abs): 0 10*3/uL (ref 0.0–0.1)
Immature Granulocytes: 0 %
Lymphocytes Absolute: 2.4 10*3/uL (ref 0.7–3.1)
Lymphs: 30 %
MCH: 30.1 pg (ref 26.6–33.0)
MCHC: 32.9 g/dL (ref 31.5–35.7)
MCV: 92 fL (ref 79–97)
Monocytes Absolute: 0.4 10*3/uL (ref 0.1–0.9)
Monocytes: 5 %
Neutrophils Absolute: 5 10*3/uL (ref 1.4–7.0)
Neutrophils: 62 %
Platelets: 305 10*3/uL (ref 150–450)
RBC: 3.82 x10E6/uL (ref 3.77–5.28)
RDW: 12.8 % (ref 11.7–15.4)
RPR Ser Ql: NONREACTIVE
Rh Factor: NEGATIVE
Rubella Antibodies, IGG: 6.74 {index} (ref >0.99)
WBC: 8 10*3/uL (ref 3.4–10.8)

## 2023-12-19 LAB — HEMOGLOBIN A1C
Est. average glucose Bld gHb Est-mCnc: 103 mg/dL
Hgb A1c MFr Bld: 5.2 % (ref 4.8–5.6)

## 2023-12-23 ENCOUNTER — Encounter: Payer: Self-pay | Admitting: Women's Health

## 2023-12-23 LAB — PANORAMA PRENATAL TEST FULL PANEL:PANORAMA TEST PLUS 5 ADDITIONAL MICRODELETIONS: FETAL FRACTION: 11.5

## 2023-12-25 LAB — HORIZON CUSTOM: REPORT SUMMARY: NEGATIVE

## 2023-12-29 ENCOUNTER — Encounter: Payer: Self-pay | Admitting: Women's Health

## 2024-01-08 ENCOUNTER — Encounter: Payer: Self-pay | Admitting: Women's Health

## 2024-01-08 ENCOUNTER — Other Ambulatory Visit: Payer: Self-pay | Admitting: Women's Health

## 2024-01-08 ENCOUNTER — Encounter: Payer: Self-pay | Admitting: Advanced Practice Midwife

## 2024-01-08 MED ORDER — OSELTAMIVIR PHOSPHATE 75 MG PO CAPS: 75.0000 mg | ORAL_CAPSULE | Freq: Every day | ORAL | 0 refills | Status: DC

## 2024-01-13 ENCOUNTER — Ambulatory Visit (INDEPENDENT_AMBULATORY_CARE_PROVIDER_SITE_OTHER): Payer: Self-pay | Admitting: Women's Health

## 2024-01-13 ENCOUNTER — Encounter: Payer: Self-pay | Admitting: Women's Health

## 2024-01-13 VITALS — BP 122/82 | HR 90 | Wt 178.0 lb

## 2024-01-13 DIAGNOSIS — R6889 Other general symptoms and signs: Secondary | ICD-10-CM

## 2024-01-13 DIAGNOSIS — Z3482 Encounter for supervision of other normal pregnancy, second trimester: Secondary | ICD-10-CM

## 2024-01-13 DIAGNOSIS — Z3480 Encounter for supervision of other normal pregnancy, unspecified trimester: Secondary | ICD-10-CM

## 2024-01-13 DIAGNOSIS — Z1379 Encounter for other screening for genetic and chromosomal anomalies: Secondary | ICD-10-CM

## 2024-01-13 DIAGNOSIS — Z363 Encounter for antenatal screening for malformations: Secondary | ICD-10-CM

## 2024-01-13 DIAGNOSIS — Z3A15 15 weeks gestation of pregnancy: Secondary | ICD-10-CM

## 2024-01-13 MED ORDER — PREDNISONE 20 MG PO TABS: 40.0000 mg | ORAL_TABLET | Freq: Every day | ORAL | 0 refills | Status: DC

## 2024-01-13 MED ORDER — AZITHROMYCIN 250 MG PO TABS: ORAL_TABLET | ORAL | 0 refills | Status: DC

## 2024-01-13 NOTE — Patient Instructions (Signed)
Aimee Burns, thank you for choosing our office today! We appreciate the opportunity to meet your healthcare needs. You may receive a short survey by mail, e-mail, or through [employer]. If you are happy with your care we would appreciate if you could take just a few minutes to complete the survey questions. We read all of your comments and take your feedback very seriously. Thank you again for choosing our office.  Center for [employer] Team at [facility] ([address] [place], [place] [address]) Entrance C, located off of [address] Free 24/7 valet parking  Go to [email or web address] to register for FREE online childbirth classes  Call the office [phone]) or go to [facility] if: You begin to severe cramping Your water breaks.  Sometimes it is a big gush of fluid, sometimes it is just a trickle that keeps getting your panties wet or running down your legs You have vaginal bleeding.  It is normal to have a small amount of spotting if your cervix was checked.   [facility] Pediatricians/Family Doctors [facility]): [address] [place]lette Ribas, [phone]           [facility]: [address], [phone]                [facility]): [address], [phone] (call to ask if accepting patients) [facility] Department: [address], [place], [phone]    [facility] Pediatricians/Family Doctors [facility]): [phone] [address], [phone] [facility]: [address] [place], [phone] [facility]: [address], [phone]  [facility] Doctors  [facility] [place]): [phone] [facility]: [address], [phone]   [facility] Doctors [facility]: [address], [phone]  [facility] Doctors  [facility]: [phone], [phone]  Home Blood Pressure Monitoring for Patients   Your provider has recommended that you check your blood pressure (BP) at least once a week at home. If you do not have a blood pressure cuff at home, one will be provided for you. Contact your provider if you have not received your monitor within 1 week.   Helpful Tips for Accurate Home Blood Pressure Checks  Don't smoke, exercise, or drink caffeine 30 minutes before checking your BP Use the restroom before checking your BP (a full bladder can raise your pressure) Relax in a comfortable upright chair Feet on the ground Left arm resting comfortably on a flat surface at the level of your heart Legs uncrossed Back supported Sit quietly and don't talk Place the cuff on your bare arm Adjust snuggly, so that only two fingertips can fit between your skin and the top of the cuff Check 2 readings separated by at least one minute Keep a log of your BP readings For a visual, please reference this diagram: http://ccnc.care/bpdiagram  Provider Name: [facility]     Phone: [phone]  Zone 1: ALL CLEAR  Continue to monitor your symptoms:  BP reading is less than 140 (top number) or less than 90 (bottom number)  No right upper stomach pain No headaches or seeing spots No feeling nauseated or throwing up No swelling in face and hands  Zone 2: CAUTION Call your doctor's office for any of the following:  BP reading is greater than 140 (top number) or greater than  90 (bottom number)  Stomach pain under your ribs in the middle or right side Headaches or seeing spots Feeling nauseated or throwing up Swelling in face and hands  Zone 3: EMERGENCY  Seek immediate medical care if you have any of the following:  BP reading is greater than160 (top number) or greater than 110 (bottom number) Severe headaches not improving with Tylenol Serious difficulty catching your breath Any worsening symptoms from  Zone 2     Second Trimester of Pregnancy The second trimester is from week 14 through week 27 (months 4 through 6). The second trimester is often a time when you feel your best. Your body has adjusted to being pregnant, and you begin to feel better physically. Usually, morning sickness has lessened or quit completely, you may have more energy, and you may have an increase in appetite. The second trimester is also a time when the fetus is growing rapidly. At the end of the sixth month, the fetus is about 9 inches long and weighs about 1 pounds. You will likely begin to feel the baby move (quickening) between 16 and 20 weeks of pregnancy. Body changes during your second trimester Your body continues to go through many changes during your second trimester. The changes vary from woman to woman. Your weight will continue to increase. You will notice your lower abdomen bulging out. You may begin to get stretch marks on your hips, abdomen, and breasts. You may develop headaches that can be relieved by medicines. The medicines should be approved by your health care provider. You may urinate more often because the fetus is pressing on your bladder. You may develop or continue to have heartburn as a result of your pregnancy. You may develop constipation because certain hormones are causing the muscles that push waste through your intestines to slow down. You may develop hemorrhoids or swollen, bulging veins (varicose veins). You may have back pain. This is caused by: Weight gain. Pregnancy hormones that are relaxing the joints in your pelvis. A shift in weight and the muscles that support your balance. Your breasts will continue to grow and they will continue to become tender. Your gums may bleed and may be sensitive to brushing and flossing. Dark spots or blotches (chloasma, mask of pregnancy) may develop on your face. This will likely fade after the baby is born. A dark line from your belly button to  the pubic area (linea nigra) may appear. This will likely fade after the baby is born. You may have changes in your hair. These can include thickening of your hair, rapid growth, and changes in texture. Some women also have hair loss during or after pregnancy, or hair that feels dry or thin. Your hair will most likely return to normal after your baby is born.  What to expect at prenatal visits During a routine prenatal visit: You will be weighed to make sure you and the fetus are growing normally. Your blood pressure will be taken. Your abdomen will be measured to track your baby's growth. The fetal heartbeat will be listened to. Any test results from the previous visit will be discussed.  Your health care provider may ask you: How you are feeling. If you are feeling the baby move. If you have had any abnormal symptoms, such as leaking fluid, bleeding, severe headaches, or abdominal cramping. If you are using any tobacco products, including cigarettes, chewing tobacco, and electronic cigarettes. If you have any questions.  Other tests that may be performed during  your second trimester include: Blood tests that check for: Low iron levels (anemia). High blood sugar that affects pregnant women (gestational diabetes) between 64 and 28 weeks. Rh antibodies. This is to check for a protein on red blood cells (Rh factor). Urine tests to check for infections, diabetes, or protein in the urine. An ultrasound to confirm the proper growth and development of the baby. An amniocentesis to check for possible genetic problems. Fetal screens for spina bifida and Down syndrome. HIV (human immunodeficiency virus) testing. Routine prenatal testing includes screening for HIV, unless you choose not to have this test.  Follow these instructions at home: Medicines Follow your health care provider's instructions regarding medicine use. Specific medicines may be either safe or unsafe to take during  pregnancy. Take a prenatal vitamin that contains at least 600 micrograms (mcg) of folic acid. If you develop constipation, try taking a stool softener if your health care provider approves. Eating and drinking Eat a balanced diet that includes fresh fruits and vegetables, whole grains, good sources of protein such as meat, eggs, or tofu, and low-fat dairy. Your health care provider will help you determine the amount of weight gain that is right for you. Avoid raw meat and uncooked cheese. These carry germs that can cause birth defects in the baby. If you have low calcium intake from food, talk to your health care provider about whether you should take a daily calcium supplement. Limit foods that are high in fat and processed sugars, such as fried and sweet foods. To prevent constipation: Drink enough fluid to keep your urine clear or pale yellow. Eat foods that are high in fiber, such as fresh fruits and vegetables, whole grains, and beans. Activity Exercise only as directed by your health care provider. Most women can continue their usual exercise routine during pregnancy. Try to exercise for 30 minutes at least 5 days a week. Stop exercising if you experience uterine contractions. Avoid heavy lifting, wear low heel shoes, and practice good posture. A sexual relationship may be continued unless your health care provider directs you otherwise. Relieving pain and discomfort Wear a good support bra to prevent discomfort from breast tenderness. Take warm sitz baths to soothe any pain or discomfort caused by hemorrhoids. Use hemorrhoid cream if your health care provider approves. Rest with your legs elevated if you have leg cramps or low back pain. If you develop varicose veins, wear support hose. Elevate your feet for 15 minutes, 3-4 times a day. Limit salt in your diet. Prenatal Care Write down your questions. Take them to your prenatal visits. Keep all your prenatal visits as told by your health  care provider. This is important. Safety Wear your seat belt at all times when driving. Make a list of emergency phone numbers, including numbers for family, friends, the hospital, and police and fire departments. General instructions Ask your health care provider for a referral to a local prenatal education class. Begin classes no later than the beginning of month 6 of your pregnancy. Ask for help if you have counseling or nutritional needs during pregnancy. Your health care provider can offer advice or refer you to specialists for help with various needs. Do not use hot tubs, steam rooms, or saunas. Do not douche or use tampons or scented sanitary pads. Do not cross your legs for long periods of time. Avoid cat litter boxes and soil used by cats. These carry germs that can cause birth defects in the baby and possibly loss of the  fetus by miscarriage or stillbirth. Avoid all smoking, herbs, alcohol, and unprescribed drugs. Chemicals in these products can affect the formation and growth of the baby. Do not use any products that contain nicotine or tobacco, such as cigarettes and e-cigarettes. If you need help quitting, ask your health care provider. Visit your dentist if you have not gone yet during your pregnancy. Use a soft toothbrush to brush your teeth and be gentle when you floss. Contact a health care provider if: You have dizziness. You have mild pelvic cramps, pelvic pressure, or nagging pain in the abdominal area. You have persistent nausea, vomiting, or diarrhea. You have a bad smelling vaginal discharge. You have pain when you urinate. Get help right away if: You have a fever. You are leaking fluid from your vagina. You have spotting or bleeding from your vagina. You have severe abdominal cramping or pain. You have rapid weight gain or weight loss. You have shortness of breath with chest pain. You notice sudden or extreme swelling of your face, hands, ankles, feet, or legs. You  have not felt your baby move in over an hour. You have severe headaches that do not go away when you take medicine. You have vision changes. Summary The second trimester is from week 14 through week 27 (months 4 through 6). It is also a time when the fetus is growing rapidly. Your body goes through many changes during pregnancy. The changes vary from woman to woman. Avoid all smoking, herbs, alcohol, and unprescribed drugs. These chemicals affect the formation and growth your baby. Do not use any tobacco products, such as cigarettes, chewing tobacco, and e-cigarettes. If you need help quitting, ask your health care provider. Contact your health care provider if you have any questions. Keep all prenatal visits as told by your health care provider. This is important. This information is not intended to replace advice given to you by your health care provider. Make sure you discuss any questions you have with your health care provider. Document Released: 11/12/2001 Document Revised: 04/25/2016 Document Reviewed: 01/19/2013 Elsevier Interactive Patient Education  2017 [employer].

## 2024-01-13 NOTE — Progress Notes (Signed)
LOW-RISK PREGNANCY VISIT Patient name: Aimee Burns MRN [id number]  Date of birth: July 18, 1991 Chief Complaint:   Routine Prenatal Visit (2nd IT)  History of Present Illness:   Aimee Burns is a 33 y.o. [phone] female at [redacted]w[redacted]d with an Estimated Date of Delivery: 06/30/24 being seen today for ongoing management of a low-risk pregnancy.   Today she reports  pretty sure has flu, 2 out of 3 of her boys dx w/ flu A last week and then went into pneumonia. Rx'd prophylactic tamiflu 2/6, pt decided not to take. Started feeling sick this Sat 2/8, congestion, cough, some fever/chills- cough has started to burn in chest, feels it may be turning into pneumonia . Contractions: Not present.  .  Movement: Absent. denies leaking of fluid.     12/16/2023    1:49 PM 10/13/2020   10:05 AM  Depression screen PHQ 2/9  Decreased Interest 0 0  Down, Depressed, Hopeless 0 0  PHQ - 2 Score 0 0  Altered sleeping 0 0  Tired, decreased energy 0 0  Change in appetite 0 0  Feeling bad or failure about yourself  0 0  Trouble concentrating 0 0  Moving slowly or fidgety/restless 0 0  Suicidal thoughts 0 0  PHQ-9 Score 0 0        12/16/2023    1:49 PM 10/13/2020   10:06 AM  GAD 7 : Generalized Anxiety Score  Nervous, Anxious, on Edge 0 0  Control/stop worrying 0 0  Worry too much - different things 0 0  Trouble relaxing 0 0  Restless 0 0  Easily annoyed or irritable 0 0  Afraid - awful might happen 0 0  Total GAD 7 Score 0 0      Review of Systems:   Pertinent items are noted in HPI Denies abnormal vaginal discharge w/ itching/odor/irritation, headaches, visual changes, shortness of breath, chest pain, abdominal pain, severe nausea/vomiting, or problems with urination or bowel movements unless otherwise stated above. Pertinent History Reviewed:  Reviewed past medical,surgical, social, obstetrical and family history.  Reviewed problem list, medications and allergies. Physical  Assessment:   Vitals:   01/13/24 1612  BP: 122/82  Pulse: 90  Weight: 178 lb (80.7 kg)  Body mass index is 27.06 kg/m. Pulse ox 99% RA        Physical Examination:   General appearance: Well appearing, and in no distress  Mental status: Alert, oriented to person, place, and time  Skin: Warm & dry  Cardiovascular: Normal heart rate noted, RRR  Respiratory: Normal respiratory effort, no distress, LCTAB, a little course bilaterally  Abdomen: Soft, gravid, nontender  Pelvic: Cervical exam deferred         Extremities: Edema: None  Fetal Status: Fetal Heart Rate (bpm): 148   Movement: Absent    Chaperone: N/A   No results found for this or any previous visit (from the past 24 hours).  Assessment & Plan:  1) Low-risk pregnancy G4P3003 at [redacted]w[redacted]d with an Estimated Date of Delivery: 06/30/24   2) Likely flu A w/ possible pneumonia , 2 of 3 of her boys dx w/ this and her sx started 3d ago, has tamiflu rx (decided not to take), rx prednisone and zpak   Meds:  Meds ordered this encounter  Medications   predniSONE (DELTASONE) 20 MG tablet    Sig: Take 2 tablets (40 mg total) by mouth daily with breakfast. X 10 days    Dispense:  20 tablet  Refill:  0   azithromycin (ZITHROMAX Z-PAK) 250 MG tablet    Sig: Use as directed    Dispense:  6 each    Refill:  0   Labs/procedures today: 2nd IT  Plan:  Continue routine obstetrical care  Next visit: prefers will be in person for u/s     Reviewed: Preterm labor symptoms and general obstetric precautions including but not limited to vaginal bleeding, contractions, leaking of fluid and fetal movement were reviewed in detail with the patient.  All questions were answered. Does have home bp cuff. Office bp cuff given: not applicable. Check bp weekly, let us know if consistently >140 and/or >90.  Follow-up: Return for As scheduled.  Future Appointments  Date Time Provider Department Center  02/10/2024  3:00 PM [facility] 2 [facility] None   02/10/2024  3:50 PM Cheral Marker, CNM [facility]    Orders Placed This Encounter  Procedures   US OB Comp + 14 Wk   INTEGRATED 2   Cheral Marker CNM, [facility] 01/13/2024 4:35 PM

## 2024-01-20 ENCOUNTER — Other Ambulatory Visit: Payer: Self-pay | Admitting: Women's Health

## 2024-01-20 ENCOUNTER — Encounter: Payer: Self-pay | Admitting: Women's Health

## 2024-01-20 MED ORDER — AMOXICILLIN-POT CLAVULANATE 875-125 MG PO TABS: 1.0000 | ORAL_TABLET | Freq: Two times a day (BID) | ORAL | 0 refills | Status: DC

## 2024-01-28 ENCOUNTER — Encounter: Payer: Self-pay | Admitting: Women's Health

## 2024-01-28 ENCOUNTER — Ambulatory Visit (INDEPENDENT_AMBULATORY_CARE_PROVIDER_SITE_OTHER): Payer: Self-pay | Admitting: Advanced Practice Midwife

## 2024-01-28 VITALS — BP 127/77 | HR 123 | Wt 182.0 lb

## 2024-01-28 DIAGNOSIS — Z3480 Encounter for supervision of other normal pregnancy, unspecified trimester: Secondary | ICD-10-CM

## 2024-01-28 DIAGNOSIS — O0992 Supervision of high risk pregnancy, unspecified, second trimester: Secondary | ICD-10-CM

## 2024-01-28 DIAGNOSIS — O26892 Other specified pregnancy related conditions, second trimester: Secondary | ICD-10-CM

## 2024-01-28 DIAGNOSIS — Z3A18 18 weeks gestation of pregnancy: Secondary | ICD-10-CM

## 2024-01-28 DIAGNOSIS — Z6791 Unspecified blood type, Rh negative: Secondary | ICD-10-CM

## 2024-01-28 LAB — POCT URINALYSIS DIPSTICK
Glucose, UA: NEGATIVE
Ketones, UA: NEGATIVE
Leukocytes, UA: NEGATIVE
Nitrite, UA: NEGATIVE
Protein, UA: NEGATIVE

## 2024-01-28 NOTE — Progress Notes (Signed)
   LOW-RISK PREGNANCY VISIT- work in for bldg Patient name: Aimee Burns MRN [id number]  Date of birth: 1991/10/05 Chief Complaint:   Routine Prenatal Visit (Having bleeding)  History of Present Illness:   Aimee Burns is a 33 y.o. [phone] female at [redacted]w[redacted]d with an Estimated Date of Delivery: 06/30/24 being seen today for ongoing management of a low-risk pregnancy.  Today she reports  vag bleeding this morning; attempted sex last night but stopped prior to penetration; noted dk blood in underwear this morning and some with wiping; none with wiping in office; no pain . Contractions: Not present. Vag. Bleeding: Scant.  Movement: Present. denies leaking of fluid. Review of Systems:   Pertinent items are noted in HPI Denies abnormal vaginal discharge w/ itching/odor/irritation, headaches, visual changes, shortness of breath, chest pain, abdominal pain, severe nausea/vomiting, or problems with urination or bowel movements unless otherwise stated above. Pertinent History Reviewed:  Reviewed past medical,surgical, social, obstetrical and family history.  Reviewed problem list, medications and allergies. Physical Assessment:   Vitals:   01/28/24 0925  BP: 127/77  Pulse: (!) 123  Weight: 182 lb (82.6 kg)  Body mass index is 27.67 kg/m.        Physical Examination:   General appearance: Well appearing, and in no distress  Mental status: Alert, oriented to person, place, and time  Skin: Warm & dry  Cardiovascular: Normal heart rate noted  Respiratory: Normal respiratory effort, no distress  Abdomen: Soft, gravid, nontender  Pelvic: Cervical exam performed ; SE: sm mucousy dk blood at cx Dilation: Closed Effacement (%): Thick    Extremities:    Fetal Status: Fetal Heart Rate (bpm): 158   Movement: Present    Results for orders placed or performed in visit on 01/28/24 (from the past 24 hours)  POCT Urinalysis Dipstick   Collection Time: 01/28/24  9:28 AM  Result Value Ref  Range   Color, UA     Clarity, UA     Glucose, UA Negative Negative   Bilirubin, UA     Ketones, UA neg    Spec Grav, UA     Blood, UA large    pH, UA     Protein, UA Negative Negative   Urobilinogen, UA     Nitrite, UA neg    Leukocytes, UA Negative Negative   Appearance     Odor      Assessment & Plan:  1) Low-risk pregnancy G4P3003 at [redacted]w[redacted]d with an Estimated Date of Delivery: 06/30/24   2) Vag spotting, pelvic rest x 7d; bleeding precautions given  3) Rh neg, Rhogam today as precaution   Meds: No orders of the defined types were placed in this encounter.  Labs/procedures today: cx exam; Rhogam  Plan:  Continue routine obstetrical care   Reviewed: Preterm labor symptoms and general obstetric precautions including but not limited to vaginal bleeding, contractions, leaking of fluid and fetal movement were reviewed in detail with the patient.  All questions were answered. Has home bp cuff.  Check bp weekly, let us know if >140/90.   Follow-up: Return for As scheduled; can she be provided with the self-pay BTL information please?.  Orders Placed This Encounter  Procedures   RHO (D) Immune Globulin   POCT Urinalysis Dipstick   Arabella Merles [facility] 01/28/2024 9:46 AM

## 2024-02-10 ENCOUNTER — Other Ambulatory Visit (HOSPITAL_COMMUNITY)
Admission: RE | Admit: 2024-02-10 | Discharge: 2024-02-10 | Disposition: A | Discharge status: Home / Self Care | Payer: MEDICAID | Source: Ambulatory Visit | Attending: Women's Health | Admitting: Women's Health

## 2024-02-10 ENCOUNTER — Encounter: Payer: Self-pay | Admitting: Women's Health

## 2024-02-10 ENCOUNTER — Ambulatory Visit (INDEPENDENT_AMBULATORY_CARE_PROVIDER_SITE_OTHER): Payer: Self-pay | Admitting: Women's Health

## 2024-02-10 ENCOUNTER — Ambulatory Visit (INDEPENDENT_AMBULATORY_CARE_PROVIDER_SITE_OTHER): Payer: Self-pay | Admitting: Radiology

## 2024-02-10 VITALS — BP 116/74 | HR 84 | Wt 185.0 lb

## 2024-02-10 DIAGNOSIS — N9489 Other specified conditions associated with female genital organs and menstrual cycle: Secondary | ICD-10-CM | POA: Insufficient documentation

## 2024-02-10 DIAGNOSIS — Z3482 Encounter for supervision of other normal pregnancy, second trimester: Secondary | ICD-10-CM | POA: Insufficient documentation

## 2024-02-10 DIAGNOSIS — Z3480 Encounter for supervision of other normal pregnancy, unspecified trimester: Secondary | ICD-10-CM

## 2024-02-10 DIAGNOSIS — R350 Frequency of micturition: Secondary | ICD-10-CM

## 2024-02-10 DIAGNOSIS — Z1379 Encounter for other screening for genetic and chromosomal anomalies: Secondary | ICD-10-CM

## 2024-02-10 DIAGNOSIS — Z3A19 19 weeks gestation of pregnancy: Secondary | ICD-10-CM

## 2024-02-10 DIAGNOSIS — Z363 Encounter for antenatal screening for malformations: Secondary | ICD-10-CM

## 2024-02-10 MED ORDER — NITROFURANTOIN MONOHYD MACRO 100 MG PO CAPS: 100.0000 mg | ORAL_CAPSULE | Freq: Two times a day (BID) | ORAL | 0 refills | Status: DC

## 2024-02-10 NOTE — Progress Notes (Signed)
 LOW-RISK PREGNANCY VISIT Patient name: Aimee Burns MRN [id number]  Date of birth: Jan 11, 1991 Chief Complaint:   Routine Prenatal Visit and Pregnancy Ultrasound (2 ndIT. discharge)  History of Present Illness:   Aimee Burns is a 33 y.o. [phone] female at 105w6d with an Estimated Date of Delivery: 06/30/24 being seen today for ongoing management of a low-risk pregnancy.   Today she reports  urinary frequency, vulvar burning- no abnormal d/c or itching/irritation . Contractions: Not present.  .  Movement: Present. denies leaking of fluid.     12/16/2023    1:49 PM 10/13/2020   10:05 AM  Depression screen PHQ 2/9  Decreased Interest 0 0  Down, Depressed, Hopeless 0 0  PHQ - 2 Score 0 0  Altered sleeping 0 0  Tired, decreased energy 0 0  Change in appetite 0 0  Feeling bad or failure about yourself  0 0  Trouble concentrating 0 0  Moving slowly or fidgety/restless 0 0  Suicidal thoughts 0 0  PHQ-9 Score 0 0        12/16/2023    1:49 PM 10/13/2020   10:06 AM  GAD 7 : Generalized Anxiety Score  Nervous, Anxious, on Edge 0 0  Control/stop worrying 0 0  Worry too much - different things 0 0  Trouble relaxing 0 0  Restless 0 0  Easily annoyed or irritable 0 0  Afraid - awful might happen 0 0  Total GAD 7 Score 0 0      Review of Systems:   Pertinent items are noted in HPI Denies abnormal vaginal discharge w/ itching/odor/irritation, headaches, visual changes, shortness of breath, chest pain, abdominal pain, severe nausea/vomiting, or problems with urination or bowel movements unless otherwise stated above. Pertinent History Reviewed:  Reviewed past medical,surgical, social, obstetrical and family history.  Reviewed problem list, medications and allergies. Physical Assessment:   Vitals:   02/10/24 1552  BP: 116/74  Pulse: 84  Weight: 185 lb (83.9 kg)  Body mass index is 28.13 kg/m.        Physical Examination:   General appearance: Well  appearing, and in no distress  Mental status: Alert, oriented to person, place, and time  Skin: Warm & dry  Cardiovascular: Normal heart rate noted  Respiratory: Normal respiratory effort, no distress  Abdomen: Soft, gravid, nontender  Pelvic:  spec exam: cx visually closed, CV swab obtained          Extremities: Edema: None  Fetal Status:     Movement: Present   [place]:  GA = 19+6 weeks  Single active female fetus, breech, FHR = 150 bpm,  Left lateral pl, gr1,  marginal pl ci, ~ 23 mm from posterior right margin.  Placenta wraps around Left Ut wall and seen anteriorly and posteriorly.  Normal amn fluid volume,  MVP = 4.6 cm, EFW 73%, 349g Anatomy screen completed and appears normal,  nl ov's, CL = 4.4 cm, closed  Chaperone: Peggy Dones No results found for this or any previous visit (from the past 24 hours).  Assessment & Plan:  1) Low-risk pregnancy G4P3003 at [redacted]w[redacted]d with an Estimated Date of Delivery: 06/30/24   2) Vulvar burning, CV swab  3) Urinary frequency> rx macrobid, send urine cx  4) Marginal cord insertion> EFW 32, 38w  5) H/O FGR> EFW 73% today   Meds:  Meds ordered this encounter  Medications   nitrofurantoin, macrocrystal-monohydrate, (MACROBID) 100 MG capsule    Sig: Take 1 capsule (100  mg total) by mouth 2 (two) times daily. X 7 days    Dispense:  14 capsule    Refill:  0   Labs/procedures today: CV swab, U/S, and urine culture, not doing 2nd IT (lost insurance)  Plan:  Continue routine obstetrical care  Next visit: prefers in person    Reviewed: Preterm labor symptoms and general obstetric precautions including but not limited to vaginal bleeding, contractions, leaking of fluid and fetal movement were reviewed in detail with the patient.  All questions were answered. Does have home bp cuff. Office bp cuff given: not applicable. Check bp weekly, let us know if consistently >140 and/or >90.  Follow-up: Return in about 4 weeks (around 03/09/2024) for LROB, CNM, in  person.  Future Appointments  Date Time Provider Department Center  03/09/2024  8:50 AM Cheral Marker, CNM [facility]    Orders Placed This Encounter  Procedures   Urine Culture   INTEGRATED 2   Cheral Marker CNM, [facility] 02/10/2024 4:22 PM

## 2024-02-10 NOTE — Patient Instructions (Signed)
 Aimee Burns, thank you for choosing our office today! We appreciate the opportunity to meet your healthcare needs. You may receive a short survey by mail, e-mail, or through [employer]. If you are happy with your care we would appreciate if you could take just a few minutes to complete the survey questions. We read all of your comments and take your feedback very seriously. Thank you again for choosing our office.  Center for [employer] Team at [facility] at [facility] ([address] [place], [place] [address]) Entrance C, located off of [address] Free 24/7 valet parking  Go to [email or web address] to register for FREE online childbirth classes  Call the office [phone]) or go to [facility] if: You begin to severe cramping Your water breaks.  Sometimes it is a big gush of fluid, sometimes it is just a trickle that keeps getting your panties wet or running down your legs You have vaginal bleeding.  It is normal to have a small amount of spotting if your cervix was checked.   [facility] Pediatricians/Family Doctors [facility]): [address] [place] Ribas, [phone]           [facility]: [address], [phone]                [facility]): [address], [phone] (call to ask if accepting patients) [facility] Department: [address], [place], [phone]    [facility] Pediatricians/Family Doctors [facility]): [phone] [address], [phone] [facility]: [address] [place], [phone] [facility]: [address], [phone]  [facility] Doctors  [facility] [place]): [phone] [facility]: [address], [phone]   [facility] Doctors [facility]: [address], [phone]  [facility] Doctors  [facility]: [phone], [phone]  Home Blood Pressure Monitoring for Patients   Your provider has recommended that you check your blood pressure (BP) at least once a week at home. If you do not have a blood pressure cuff at home, one will be provided for you. Contact your provider if you have not received your monitor within 1 week.   Helpful Tips for Accurate Home Blood Pressure Checks  Don't smoke, exercise, or drink caffeine 30 minutes before checking your BP Use the restroom before checking your BP (a full bladder can raise your pressure) Relax in a comfortable upright chair Feet on the ground Left arm resting comfortably on a flat surface at the level of your heart Legs uncrossed Back supported Sit quietly and don't talk Place the cuff on your bare arm Adjust snuggly, so that only two fingertips can fit between your skin and the top of the cuff Check 2 readings separated by at least one minute Keep a log of your BP readings For a visual, please reference this diagram: http://ccnc.care/bpdiagram  Provider Name: [facility]     Phone: [phone]  Zone 1: ALL CLEAR  Continue to monitor your symptoms:  BP reading is less than 140 (top number) or less than 90 (bottom number)  No right upper stomach pain No headaches or seeing spots No feeling nauseated or throwing up No swelling in face and hands  Zone 2: CAUTION Call your doctor's office for any of the following:  BP reading is greater than 140 (top number) or greater than  90 (bottom number)  Stomach pain under your ribs in the middle or right side Headaches or seeing spots Feeling nauseated or throwing up Swelling in face and hands  Zone 3: EMERGENCY  Seek immediate medical care if you have any of the following:  BP reading is greater than160 (top number) or greater than 110 (bottom number) Severe headaches not improving with Tylenol Serious difficulty catching your breath Any worsening symptoms from  Zone 2     Second Trimester of Pregnancy The second trimester is from week 14 through week 27 (months 4 through 6). The second trimester is often a time when you feel your best. Your body has adjusted to being pregnant, and you begin to feel better physically. Usually, morning sickness has lessened or quit completely, you may have more energy, and you may have an increase in appetite. The second trimester is also a time when the fetus is growing rapidly. At the end of the sixth month, the fetus is about 9 inches long and weighs about 1 pounds. You will likely begin to feel the baby move (quickening) between 16 and 20 weeks of pregnancy. Body changes during your second trimester Your body continues to go through many changes during your second trimester. The changes vary from woman to woman. Your weight will continue to increase. You will notice your lower abdomen bulging out. You may begin to get stretch marks on your hips, abdomen, and breasts. You may develop headaches that can be relieved by medicines. The medicines should be approved by your health care provider. You may urinate more often because the fetus is pressing on your bladder. You may develop or continue to have heartburn as a result of your pregnancy. You may develop constipation because certain hormones are causing the muscles that push waste through your intestines to slow down. You may develop hemorrhoids or swollen, bulging veins (varicose veins). You may have back pain. This is caused by: Weight gain. Pregnancy hormones that are relaxing the joints in your pelvis. A shift in weight and the muscles that support your balance. Your breasts will continue to grow and they will continue to become tender. Your gums may bleed and may be sensitive to brushing and flossing. Dark spots or blotches (chloasma, mask of pregnancy) may develop on your face. This will likely fade after the baby is born. A dark line from your belly button to  the pubic area (linea nigra) may appear. This will likely fade after the baby is born. You may have changes in your hair. These can include thickening of your hair, rapid growth, and changes in texture. Some women also have hair loss during or after pregnancy, or hair that feels dry or thin. Your hair will most likely return to normal after your baby is born.  What to expect at prenatal visits During a routine prenatal visit: You will be weighed to make sure you and the fetus are growing normally. Your blood pressure will be taken. Your abdomen will be measured to track your baby's growth. The fetal heartbeat will be listened to. Any test results from the previous visit will be discussed.  Your health care provider may ask you: How you are feeling. If you are feeling the baby move. If you have had any abnormal symptoms, such as leaking fluid, bleeding, severe headaches, or abdominal cramping. If you are using any tobacco products, including cigarettes, chewing tobacco, and electronic cigarettes. If you have any questions.  Other tests that may be performed during  your second trimester include: Blood tests that check for: Low iron levels (anemia). High blood sugar that affects pregnant women (gestational diabetes) between 64 and 28 weeks. Rh antibodies. This is to check for a protein on red blood cells (Rh factor). Urine tests to check for infections, diabetes, or protein in the urine. An ultrasound to confirm the proper growth and development of the baby. An amniocentesis to check for possible genetic problems. Fetal screens for spina bifida and Down syndrome. HIV (human immunodeficiency virus) testing. Routine prenatal testing includes screening for HIV, unless you choose not to have this test.  Follow these instructions at home: Medicines Follow your health care provider's instructions regarding medicine use. Specific medicines may be either safe or unsafe to take during  pregnancy. Take a prenatal vitamin that contains at least 600 micrograms (mcg) of folic acid. If you develop constipation, try taking a stool softener if your health care provider approves. Eating and drinking Eat a balanced diet that includes fresh fruits and vegetables, whole grains, good sources of protein such as meat, eggs, or tofu, and low-fat dairy. Your health care provider will help you determine the amount of weight gain that is right for you. Avoid raw meat and uncooked cheese. These carry germs that can cause birth defects in the baby. If you have low calcium intake from food, talk to your health care provider about whether you should take a daily calcium supplement. Limit foods that are high in fat and processed sugars, such as fried and sweet foods. To prevent constipation: Drink enough fluid to keep your urine clear or pale yellow. Eat foods that are high in fiber, such as fresh fruits and vegetables, whole grains, and beans. Activity Exercise only as directed by your health care provider. Most women can continue their usual exercise routine during pregnancy. Try to exercise for 30 minutes at least 5 days a week. Stop exercising if you experience uterine contractions. Avoid heavy lifting, wear low heel shoes, and practice good posture. A sexual relationship may be continued unless your health care provider directs you otherwise. Relieving pain and discomfort Wear a good support bra to prevent discomfort from breast tenderness. Take warm sitz baths to soothe any pain or discomfort caused by hemorrhoids. Use hemorrhoid cream if your health care provider approves. Rest with your legs elevated if you have leg cramps or low back pain. If you develop varicose veins, wear support hose. Elevate your feet for 15 minutes, 3-4 times a day. Limit salt in your diet. Prenatal Care Write down your questions. Take them to your prenatal visits. Keep all your prenatal visits as told by your health  care provider. This is important. Safety Wear your seat belt at all times when driving. Make a list of emergency phone numbers, including numbers for family, friends, the hospital, and police and fire departments. General instructions Ask your health care provider for a referral to a local prenatal education class. Begin classes no later than the beginning of month 6 of your pregnancy. Ask for help if you have counseling or nutritional needs during pregnancy. Your health care provider can offer advice or refer you to specialists for help with various needs. Do not use hot tubs, steam rooms, or saunas. Do not douche or use tampons or scented sanitary pads. Do not cross your legs for long periods of time. Avoid cat litter boxes and soil used by cats. These carry germs that can cause birth defects in the baby and possibly loss of the  fetus by miscarriage or stillbirth. Avoid all smoking, herbs, alcohol, and unprescribed drugs. Chemicals in these products can affect the formation and growth of the baby. Do not use any products that contain nicotine or tobacco, such as cigarettes and e-cigarettes. If you need help quitting, ask your health care provider. Visit your dentist if you have not gone yet during your pregnancy. Use a soft toothbrush to brush your teeth and be gentle when you floss. Contact a health care provider if: You have dizziness. You have mild pelvic cramps, pelvic pressure, or nagging pain in the abdominal area. You have persistent nausea, vomiting, or diarrhea. You have a bad smelling vaginal discharge. You have pain when you urinate. Get help right away if: You have a fever. You are leaking fluid from your vagina. You have spotting or bleeding from your vagina. You have severe abdominal cramping or pain. You have rapid weight gain or weight loss. You have shortness of breath with chest pain. You notice sudden or extreme swelling of your face, hands, ankles, feet, or legs. You  have not felt your baby move in over an hour. You have severe headaches that do not go away when you take medicine. You have vision changes. Summary The second trimester is from week 14 through week 27 (months 4 through 6). It is also a time when the fetus is growing rapidly. Your body goes through many changes during pregnancy. The changes vary from woman to woman. Avoid all smoking, herbs, alcohol, and unprescribed drugs. These chemicals affect the formation and growth your baby. Do not use any tobacco products, such as cigarettes, chewing tobacco, and e-cigarettes. If you need help quitting, ask your health care provider. Contact your health care provider if you have any questions. Keep all prenatal visits as told by your health care provider. This is important. This information is not intended to replace advice given to you by your health care provider. Make sure you discuss any questions you have with your health care provider. Document Released: 11/12/2001 Document Revised: 04/25/2016 Document Reviewed: 01/19/2013 Elsevier Interactive Patient Education  2017 [employer].

## 2024-02-10 NOTE — Progress Notes (Signed)
 [place]:  GA = 19+6 weeks  Single active female fetus, breech, FHR = 150 bpm,  Left lateral pl, gr1,  marginal pl ci, ~ 23 mm from posterior right margin.  Placenta wraps around Left Ut wall and seen anteriorly and posteriorly.  Normal amn fluid volume,  MVP = 4.6 cm, EFW 73%, 349g Anatomy screen completed and appears normal,  nl ov's, CL = 4.4 cm, closed

## 2024-02-12 ENCOUNTER — Encounter: Payer: Self-pay | Admitting: Women's Health

## 2024-02-12 LAB — CERVICOVAGINAL ANCILLARY ONLY
Bacterial Vaginitis (gardnerella): POSITIVE — AB
Candida Glabrata: NEGATIVE
Candida Vaginitis: NEGATIVE
Chlamydia: NEGATIVE
Comment: NEGATIVE
Comment: NEGATIVE
Comment: NEGATIVE
Comment: NEGATIVE
Comment: NEGATIVE
Comment: NORMAL
Neisseria Gonorrhea: NEGATIVE
Trichomonas: NEGATIVE

## 2024-02-12 LAB — URINE CULTURE: Organism ID, Bacteria: NO GROWTH

## 2024-02-12 MED ORDER — METRONIDAZOLE 500 MG PO TABS: 500.0000 mg | ORAL_TABLET | Freq: Two times a day (BID) | ORAL | 0 refills | Status: DC

## 2024-02-12 NOTE — Addendum Note (Signed)
 Addended by: Cheral Marker on: 02/12/2024 12:45 PM   Modules accepted: Orders

## 2024-03-09 ENCOUNTER — Encounter: Payer: Self-pay | Admitting: Women's Health

## 2024-03-09 ENCOUNTER — Ambulatory Visit (INDEPENDENT_AMBULATORY_CARE_PROVIDER_SITE_OTHER): Payer: Self-pay | Admitting: Women's Health

## 2024-03-09 VITALS — BP 129/73 | HR 99 | Wt 188.0 lb

## 2024-03-09 DIAGNOSIS — Z3482 Encounter for supervision of other normal pregnancy, second trimester: Secondary | ICD-10-CM | POA: Diagnosis not present

## 2024-03-09 DIAGNOSIS — Z3A23 23 weeks gestation of pregnancy: Secondary | ICD-10-CM

## 2024-03-09 NOTE — Progress Notes (Signed)
 LOW-RISK PREGNANCY VISIT Patient name: Aimee Burns MRN [id number]  Date of birth: 23-Dec-1990 Chief Complaint:   Routine Prenatal Visit  History of Present Illness:   Aimee Burns is a 33 y.o. [phone] female at [redacted]w[redacted]d with an Estimated Date of Delivery: 06/30/24 being seen today for ongoing management of a low-risk pregnancy.   Today she reports no complaints. New job at [employer] in [place], [place] lower back was hurting yesterday, thinks it was from sitting in chair. Contractions: Not present. Vag. Bleeding: None.  Movement: Present. denies leaking of fluid.     12/16/2023    1:49 PM 10/13/2020   10:05 AM  Depression screen PHQ 2/9  Decreased Interest 0 0  Down, Depressed, Hopeless 0 0  PHQ - 2 Score 0 0  Altered sleeping 0 0  Tired, decreased energy 0 0  Change in appetite 0 0  Feeling bad or failure about yourself  0 0  Trouble concentrating 0 0  Moving slowly or fidgety/restless 0 0  Suicidal thoughts 0 0  PHQ-9 Score 0 0        12/16/2023    1:49 PM 10/13/2020   10:06 AM  GAD 7 : Generalized Anxiety Score  Nervous, Anxious, on Edge 0 0  Control/stop worrying 0 0  Worry too much - different things 0 0  Trouble relaxing 0 0  Restless 0 0  Easily annoyed or irritable 0 0  Afraid - awful might happen 0 0  Total GAD 7 Score 0 0      Review of Systems:   Pertinent items are noted in HPI Denies abnormal vaginal discharge w/ itching/odor/irritation, headaches, visual changes, shortness of breath, chest pain, abdominal pain, severe nausea/vomiting, or problems with urination or bowel movements unless otherwise stated above. Pertinent History Reviewed:  Reviewed past medical,surgical, social, obstetrical and family history.  Reviewed problem list, medications and allergies. Physical Assessment:   Vitals:   03/09/24 0852  BP: 129/73  Pulse: 99  Weight: 188 lb (85.3 kg)  Body mass index is 28.59 kg/m.        Physical Examination:   General  appearance: Well appearing, and in no distress  Mental status: Alert, oriented to person, place, and time  Skin: Warm & dry  Cardiovascular: Normal heart rate noted  Respiratory: Normal respiratory effort, no distress  Abdomen: Soft, gravid, nontender  Pelvic: Cervical exam deferred         Extremities: Edema: None  Fetal Status: Fetal Heart Rate (bpm): 142 Fundal Height: 24 cm Movement: Present    Chaperone: N/A No results found for this or any previous visit (from the past 24 hours).  Assessment & Plan:  1) Low-risk pregnancy G4P3003 at [redacted]w[redacted]d with an Estimated Date of Delivery: 06/30/24   2) Marginal cord insertion w/ h/o FGR> EFW 32 & 38w   Meds: No orders of the defined types were placed in this encounter.  Labs/procedures today: none  Plan:  Continue routine obstetrical care  Next visit: prefers will be in person for pn2     Reviewed: Preterm labor symptoms and general obstetric precautions including but not limited to vaginal bleeding, contractions, leaking of fluid and fetal movement were reviewed in detail with the patient.  All questions were answered. Does have home bp cuff. Office bp cuff given: not applicable. Check bp weekly, let us know if consistently >140 and/or >90.  Follow-up: Return in about 4 weeks (around 04/06/2024) for LROB, PN2, CNM, in person.  No  future appointments.  No orders of the defined types were placed in this encounter.  Cheral Marker CNM, [facility] 03/09/2024 9:14 AM

## 2024-03-09 NOTE — Patient Instructions (Signed)
 Aimee Burns, thank you for choosing our office today! We appreciate the opportunity to meet your healthcare needs. You may receive a short survey by mail, e-mail, or through [employer]. If you are happy with your care we would appreciate if you could take just a few minutes to complete the survey questions. We read all of your comments and take your feedback very seriously. Thank you again for choosing our office.  Center for [employer] Team at [facility]  [facility] at [facility] ([address] [place], [place] [address]) Entrance C, located off of [address] Free 24/7 valet parking   You will have your sugar test next visit.  Please do not eat or drink anything after midnight the night before you come, not even water.  You will be here for at least two hours.  Please make an appointment online for the bloodwork at [email or web address] for 8:00am (or as close to this as possible). Make sure you select the [facility] service center.   CLASSES: Go to [email or web address] to register for classes (childbirth, breastfeeding, waterbirth, infant CPR, daddy bootcamp, etc.)  Call the office [phone]) or go to [facility] if: You begin to have strong, frequent contractions Your water breaks.  Sometimes it is a big gush of fluid, sometimes it is just a trickle that keeps getting your panties wet or running down your legs You have vaginal bleeding.  It is normal to have a small amount of spotting if your cervix was checked.  You don't feel your baby moving like normal.  If you don't, get you something to eat and drink and lay down and focus on feeling your baby move.   If your baby is still not moving like normal, you should call the office or go to [facility]  Call the office [phone]) or go to [facility] hospital for these signs of pre-eclampsia: Severe headache that does not go away with Tylenol Visual changes- seeing spots, double, blurred vision Pain under your right breast or  upper abdomen that does not go away with Tums or heartburn medicine Nausea and/or vomiting Severe swelling in your hands, feet, and face    [place] [facility]/Family Doctors [facility]): [address] Colette Ribas, [phone]           [facility]: [address], [phone]                [facility]): [address], [phone]  [facility] Department: [address], [place], [phone]    [facility] Pediatricians/Family Doctors [facility]): [address], [phone] [facility]: [address] [place], [phone] [facility] [place]: [address], [phone]  [facility] Doctors  [facility]): [phone] [facility]: [address] [phone]   [facility] Doctors [facility]: [address], [phone]  [facility] Doctors  [facility]: [phone], [phone]  Home Blood Pressure Monitoring for Patients   Your provider has recommended that you check your blood pressure (BP) at least once a week at home. If you do not have a blood pressure cuff at home, one will be provided for you. Contact your provider if you have not received your monitor within 1 week.   Helpful Tips for Accurate Home Blood Pressure Checks  Don't smoke, exercise, or drink caffeine 30 minutes before checking  your BP Use the restroom before checking your BP (a full bladder can raise your pressure) Relax in a comfortable upright chair Feet on the ground Left arm resting comfortably on a flat surface at the level of your heart Legs uncrossed Back supported Sit quietly and don't talk Place the cuff on your bare arm Adjust snuggly, so that only two fingertips can fit between your skin and the top of the cuff Check 2 readings separated by at least one  minute Keep a log of your BP readings For a visual, please reference this diagram: http://ccnc.care/bpdiagram  Provider Name: [facility]     Phone: [phone]  Zone 1: ALL CLEAR  Continue to monitor your symptoms:  BP reading is less than 140 (top number) or less than 90 (bottom number)  No right upper stomach pain No headaches or seeing spots No feeling nauseated or throwing up No swelling in face and hands  Zone 2: CAUTION Call your doctor's office for any of the following:  BP reading is greater than 140 (top number) or greater than 90 (bottom number)  Stomach pain under your ribs in the middle or right side Headaches or seeing spots Feeling nauseated or throwing up Swelling in face and hands  Zone 3: EMERGENCY  Seek immediate medical care if you have any of the following:  BP reading is greater than160 (top number) or greater than 110 (bottom number) Severe headaches not improving with Tylenol Serious difficulty catching your breath Any worsening symptoms from Zone 2   Second Trimester of Pregnancy The second trimester is from week 13 through week 28, months 4 through 6. The second trimester is often a time when you feel your best. Your body has also adjusted to being pregnant, and you begin to feel better physically. Usually, morning sickness has lessened or quit completely, you may have more energy, and you may have an increase in appetite. The second trimester is also a time when the fetus is growing rapidly. At the end of the sixth month, the fetus is about 9 inches long and weighs about 1 pounds. You will likely begin to feel the baby move (quickening) between 18 and 20 weeks of the pregnancy. BODY CHANGES Your body goes through many changes during pregnancy. The changes vary from woman to woman.  Your weight will continue to increase. You will notice your lower abdomen bulging out. You may begin to get stretch marks on your hips, abdomen, and breasts. You may  develop headaches that can be relieved by medicines approved by your health care provider. You may urinate more often because the fetus is pressing on your bladder. You may develop or continue to have heartburn as a result of your pregnancy. You may develop constipation because certain hormones are causing the muscles that push waste through your intestines to slow down. You may develop hemorrhoids or swollen, bulging veins (varicose veins). You may have back pain because of the weight gain and pregnancy hormones relaxing your joints between the bones in your pelvis and as a result of a shift in weight and the muscles that support your balance. Your breasts will continue to grow and be tender. Your gums may bleed and may be sensitive to brushing and flossing. Dark spots or blotches (chloasma, mask of pregnancy) may develop on your face. This will likely fade after the baby is born. A dark line from your belly button to the pubic area (linea nigra) may appear. This will likely fade after the  baby is born. You may have changes in your hair. These can include thickening of your hair, rapid growth, and changes in texture. Some women also have hair loss during or after pregnancy, or hair that feels dry or thin. Your hair will most likely return to normal after your baby is born. WHAT TO EXPECT AT YOUR PRENATAL VISITS During a routine prenatal visit: You will be weighed to make sure you and the fetus are growing normally. Your blood pressure will be taken. Your abdomen will be measured to track your baby's growth. The fetal heartbeat will be listened to. Any test results from the previous visit will be discussed. Your health care provider may ask you: How you are feeling. If you are feeling the baby move. If you have had any abnormal symptoms, such as leaking fluid, bleeding, severe headaches, or abdominal cramping. If you have any questions. Other tests that may be performed during your second  trimester include: Blood tests that check for: Low iron levels (anemia). Gestational diabetes (between 24 and 28 weeks). Rh antibodies. Urine tests to check for infections, diabetes, or protein in the urine. An ultrasound to confirm the proper growth and development of the baby. An amniocentesis to check for possible genetic problems. Fetal screens for spina bifida and Down syndrome. HOME CARE INSTRUCTIONS  Avoid all smoking, herbs, alcohol, and unprescribed drugs. These chemicals affect the formation and growth of the baby. Follow your health care provider's instructions regarding medicine use. There are medicines that are either safe or unsafe to take during pregnancy. Exercise only as directed by your health care provider. Experiencing uterine cramps is a good sign to stop exercising. Continue to eat regular, healthy meals. Wear a good support bra for breast tenderness. Do not use hot tubs, steam rooms, or saunas. Wear your seat belt at all times when driving. Avoid raw meat, uncooked cheese, cat litter boxes, and soil used by cats. These carry germs that can cause birth defects in the baby. Take your prenatal vitamins. Try taking a stool softener (if your health care provider approves) if you develop constipation. Eat more high-fiber foods, such as fresh vegetables or fruit and whole grains. Drink plenty of fluids to keep your urine clear or pale yellow. Take warm sitz baths to soothe any pain or discomfort caused by hemorrhoids. Use hemorrhoid cream if your health care provider approves. If you develop varicose veins, wear support hose. Elevate your feet for 15 minutes, 3-4 times a day. Limit salt in your diet. Avoid heavy lifting, wear low heel shoes, and practice good posture. Rest with your legs elevated if you have leg cramps or low back pain. Visit your dentist if you have not gone yet during your pregnancy. Use a soft toothbrush to brush your teeth and be gentle when you floss. A  sexual relationship may be continued unless your health care provider directs you otherwise. Continue to go to all your prenatal visits as directed by your health care provider. SEEK MEDICAL CARE IF:  You have dizziness. You have mild pelvic cramps, pelvic pressure, or nagging pain in the abdominal area. You have persistent nausea, vomiting, or diarrhea. You have a bad smelling vaginal discharge. You have pain with urination. SEEK IMMEDIATE MEDICAL CARE IF:  You have a fever. You are leaking fluid from your vagina. You have spotting or bleeding from your vagina. You have severe abdominal cramping or pain. You have rapid weight gain or loss. You have shortness of breath with chest pain. You  notice sudden or extreme swelling of your face, hands, ankles, feet, or legs. You have not felt your baby move in over an hour. You have severe headaches that do not go away with medicine. You have vision changes. Document Released: 11/12/2001 Document Revised: 11/23/2013 Document Reviewed: 01/19/2013 [facility] Patient Information 2015 [place], [place]. This information is not intended to replace advice given to you by your health care provider. Make sure you discuss any questions you have with your health care provider.

## 2024-03-13 DIAGNOSIS — Z419 Encounter for procedure for purposes other than remedying health state, unspecified: Secondary | ICD-10-CM | POA: Diagnosis not present

## 2024-03-29 ENCOUNTER — Encounter: Payer: Self-pay | Admitting: Women's Health

## 2024-04-06 ENCOUNTER — Other Ambulatory Visit

## 2024-04-06 ENCOUNTER — Ambulatory Visit: Admitting: Obstetrics and Gynecology

## 2024-04-06 VITALS — BP 125/78 | HR 85 | Wt 193.0 lb

## 2024-04-06 DIAGNOSIS — O43199 Other malformation of placenta, unspecified trimester: Secondary | ICD-10-CM

## 2024-04-06 DIAGNOSIS — Z1332 Encounter for screening for maternal depression: Secondary | ICD-10-CM | POA: Diagnosis not present

## 2024-04-06 DIAGNOSIS — O43192 Other malformation of placenta, second trimester: Secondary | ICD-10-CM | POA: Diagnosis not present

## 2024-04-06 DIAGNOSIS — Z3480 Encounter for supervision of other normal pregnancy, unspecified trimester: Secondary | ICD-10-CM

## 2024-04-06 DIAGNOSIS — Z23 Encounter for immunization: Secondary | ICD-10-CM | POA: Diagnosis not present

## 2024-04-06 DIAGNOSIS — Z3482 Encounter for supervision of other normal pregnancy, second trimester: Secondary | ICD-10-CM | POA: Diagnosis not present

## 2024-04-06 DIAGNOSIS — Z3A27 27 weeks gestation of pregnancy: Secondary | ICD-10-CM | POA: Diagnosis not present

## 2024-04-06 DIAGNOSIS — Z131 Encounter for screening for diabetes mellitus: Secondary | ICD-10-CM

## 2024-04-06 NOTE — Progress Notes (Signed)
   PRENATAL VISIT NOTE  Subjective:  Aimee Burns is a 33 y.o. [phone] at [redacted]w[redacted]d being seen today for ongoing prenatal care.  She is currently monitored for the following issues for this low-risk pregnancy and has Renal calculi; Thyroid tumor, benign; History of prior pregnancy with IUGR newborn; Panic disorder; Rh negative state in antepartum period; and Encounter for supervision of normal pregnancy, antepartum on their problem list.  Patient reports Contractions: Not present. Vag. Bleeding: None.  Movement: Present. Denies leaking of fluid.   The following portions of the patient's history were reviewed and updated as appropriate: allergies, current medications, past family history, past medical history, past social history, past surgical history and problem list.   Objective:   Vitals:   04/06/24 0844  BP: 125/78  Pulse: 85  Weight: 193 lb (87.5 kg)    Fetal Status: Fetal Heart Rate (bpm): 145 Fundal Height: 27 cm Movement: Present     General:  Alert, oriented and cooperative. Patient is in no acute distress.  Skin: Skin is warm and dry. No rash noted.   Cardiovascular: Normal heart rate noted  Respiratory: Normal respiratory effort, no problems with respiration noted  Abdomen: Soft, gravid, appropriate for gestational age.  Pain/Pressure: Absent     Pelvic: Cervical exam deferred        Extremities: Normal range of motion.     Mental Status: Normal mood and affect. Normal behavior. Normal judgment and thought content.   Assessment and Plan:  Pregnancy: G4P3003 at [redacted]w[redacted]d 1. Encounter for supervision of other normal pregnancy in second trimester (Primary) BP and FHR normal Doing well, feeling regular movement  FH appropriate  2. [redacted] weeks gestation of pregnancy GTT and labs today  Tdap given today Rhogam next visit  3. Marginal insertion of umbilical cord affecting management of mother Normal growth, follow up @ 32 weeks  - US  OB Follow Up; Future schedule at 32  weeks    Preterm labor symptoms and general obstetric precautions including but not limited to vaginal bleeding, contractions, leaking of fluid and fetal movement were reviewed in detail with the patient. Please refer to After Visit Summary for other counseling recommendations.   Future Appointments  Date Time Provider Department Center  04/20/2024  4:10 PM Zelma Hidden, FNP [facility]  05/04/2024  8:30 AM [facility] - FT IMG 2 [facility] None  05/04/2024  9:30 AM Zelma Hidden, FNP [facility]      Susi Eric, FNP

## 2024-04-07 LAB — AB SCR+ANTIBODY ID

## 2024-04-08 LAB — GLUCOSE TOLERANCE, 2 HOURS W/ 1HR
Glucose, 1 hour: 113 mg/dL (ref 70–179)
Glucose, 2 hour: 91 mg/dL (ref 70–152)
Glucose, Fasting: 73 mg/dL (ref 70–91)

## 2024-04-08 LAB — HIV ANTIBODY (ROUTINE TESTING W REFLEX): HIV Screen 4th Generation wRfx: NONREACTIVE

## 2024-04-08 LAB — CBC
Hematocrit: 31.3 % — ABNORMAL LOW (ref 34.0–46.6)
Hemoglobin: 10.6 g/dL — ABNORMAL LOW (ref 11.1–15.9)
MCH: 31.2 pg (ref 26.6–33.0)
MCHC: 33.9 g/dL (ref 31.5–35.7)
MCV: 92 fL (ref 79–97)
Platelets: 264 10*3/uL (ref 150–450)
RBC: 3.4 x10E6/uL — ABNORMAL LOW (ref 3.77–5.28)
RDW: 13.2 % (ref 11.7–15.4)
WBC: 9.3 10*3/uL (ref 3.4–10.8)

## 2024-04-08 LAB — AB SCR+ANTIBODY ID

## 2024-04-08 LAB — ANTIBODY SCREEN

## 2024-04-08 LAB — RPR: RPR Ser Ql: NONREACTIVE

## 2024-04-09 ENCOUNTER — Encounter: Payer: Self-pay | Admitting: Women's Health

## 2024-04-12 DIAGNOSIS — Z419 Encounter for procedure for purposes other than remedying health state, unspecified: Secondary | ICD-10-CM | POA: Diagnosis not present

## 2024-04-20 ENCOUNTER — Ambulatory Visit (INDEPENDENT_AMBULATORY_CARE_PROVIDER_SITE_OTHER): Admitting: Obstetrics and Gynecology

## 2024-04-20 ENCOUNTER — Encounter: Payer: Self-pay | Admitting: Obstetrics and Gynecology

## 2024-04-20 VITALS — BP 125/75 | HR 105 | Wt 194.8 lb

## 2024-04-20 DIAGNOSIS — O43193 Other malformation of placenta, third trimester: Secondary | ICD-10-CM | POA: Diagnosis not present

## 2024-04-20 DIAGNOSIS — Z3A29 29 weeks gestation of pregnancy: Secondary | ICD-10-CM

## 2024-04-20 DIAGNOSIS — Z3480 Encounter for supervision of other normal pregnancy, unspecified trimester: Secondary | ICD-10-CM

## 2024-04-20 DIAGNOSIS — O43199 Other malformation of placenta, unspecified trimester: Secondary | ICD-10-CM

## 2024-04-20 NOTE — Addendum Note (Signed)
 Addended by: Laurinda Porch A on: 04/20/2024 05:03 PM   Modules accepted: Orders

## 2024-04-20 NOTE — Progress Notes (Signed)
   PRENATAL VISIT NOTE  Subjective:  Aimee Burns is a 33 y.o. [phone] at [redacted]w[redacted]d being seen today for ongoing prenatal care.  She is currently monitored for the following issues for this low-risk pregnancy and has Renal calculi; Thyroid tumor, benign; History of prior pregnancy with IUGR newborn; Panic disorder; Rh negative state in antepartum period; and Encounter for supervision of normal pregnancy, antepartum on their problem list.  Patient reports no complaints.  Contractions: Irritability.  .  Movement: Present. Denies leaking of fluid.   The following portions of the patient's history were reviewed and updated as appropriate: allergies, current medications, past family history, past medical history, past social history, past surgical history and problem list.   Objective:    Vitals:   04/20/24 1617  BP: 125/75  Pulse: (!) 105  Weight: 194 lb 12.8 oz (88.4 kg)    Fetal Status:      Movement: Present    General: Alert, oriented and cooperative. Patient is in no acute distress.  Skin: Skin is warm and dry. No rash noted.   Cardiovascular: Normal heart rate noted  Respiratory: Normal respiratory effort, no problems with respiration noted  Abdomen: Soft, gravid, appropriate for gestational age.  Pain/Pressure: Absent     Pelvic: Cervical exam deferred        Extremities: Normal range of motion.  Edema: None  Mental Status: Normal mood and affect. Normal behavior. Normal judgment and thought content.   Assessment and Plan:  Pregnancy: G4P3003 at [redacted]w[redacted]d 1. Supervision of other normal pregnancy, antepartum (Primary) BP and FHR normal   2. [redacted] weeks gestation of pregnancy Rhogam today    3. Marginal insertion of umbilical cord affecting management of mother Follow up growth next visit    Preterm labor symptoms and general obstetric precautions including but not limited to vaginal bleeding, contractions, leaking of fluid and fetal movement were reviewed in detail with  the patient. Please refer to After Visit Summary for other counseling recommendations.   Return in two weeks for routine prenatal   Future Appointments  Date Time Provider Department Center  05/04/2024  8:30 AM [facility] - FT IMG 2 [facility] None  05/04/2024  9:30 AM Zelma Hidden, FNP [facility]    Susi Eric, FNP

## 2024-05-03 ENCOUNTER — Other Ambulatory Visit: Payer: Self-pay | Admitting: Obstetrics and Gynecology

## 2024-05-03 DIAGNOSIS — Z3480 Encounter for supervision of other normal pregnancy, unspecified trimester: Secondary | ICD-10-CM

## 2024-05-03 DIAGNOSIS — O43199 Other malformation of placenta, unspecified trimester: Secondary | ICD-10-CM

## 2024-05-03 DIAGNOSIS — O0993 Supervision of high risk pregnancy, unspecified, third trimester: Secondary | ICD-10-CM

## 2024-05-03 DIAGNOSIS — Z3493 Encounter for supervision of normal pregnancy, unspecified, third trimester: Secondary | ICD-10-CM

## 2024-05-04 ENCOUNTER — Ambulatory Visit: Admitting: Obstetrics and Gynecology

## 2024-05-04 ENCOUNTER — Ambulatory Visit (INDEPENDENT_AMBULATORY_CARE_PROVIDER_SITE_OTHER): Admitting: Radiology

## 2024-05-04 VITALS — BP 117/64 | HR 94 | Wt 196.0 lb

## 2024-05-04 DIAGNOSIS — Z3A31 31 weeks gestation of pregnancy: Secondary | ICD-10-CM | POA: Diagnosis not present

## 2024-05-04 DIAGNOSIS — O43199 Other malformation of placenta, unspecified trimester: Secondary | ICD-10-CM

## 2024-05-04 DIAGNOSIS — Z3480 Encounter for supervision of other normal pregnancy, unspecified trimester: Secondary | ICD-10-CM

## 2024-05-04 DIAGNOSIS — O43193 Other malformation of placenta, third trimester: Secondary | ICD-10-CM | POA: Diagnosis not present

## 2024-05-04 DIAGNOSIS — Z3493 Encounter for supervision of normal pregnancy, unspecified, third trimester: Secondary | ICD-10-CM

## 2024-05-04 NOTE — Progress Notes (Signed)
   PRENATAL VISIT NOTE  Subjective:  Aimee Burns is a 33 y.o. [phone] at [redacted]w[redacted]d being seen today for ongoing prenatal care.  She is currently monitored for the following issues for this low-risk pregnancy and has Renal calculi; Thyroid tumor, benign; History of prior pregnancy with IUGR newborn; Panic disorder; Rh negative state in antepartum period; and Encounter for supervision of normal pregnancy, antepartum on their problem list.  Patient reports will sometimes have visual disturbance especially in the morning, normal BPs at home.  Contractions: Not present. Vag. Bleeding: None.  Movement: Present. Denies leaking of fluid.   The following portions of the patient's history were reviewed and updated as appropriate: allergies, current medications, past family history, past medical history, past social history, past surgical history and problem list.   Objective:    Vitals:   05/04/24 0855  BP: 117/64  Pulse: 94  Weight: 196 lb (88.9 kg)    Fetal Status:      Movement: Present    General: Alert, oriented and cooperative. Patient is in no acute distress.  Skin: Skin is warm and dry. No rash noted.   Cardiovascular: Normal heart rate noted  Respiratory: Normal respiratory effort, no problems with respiration noted  Abdomen: Soft, gravid, appropriate for gestational age.  Pain/Pressure: Absent     Pelvic: Cervical exam deferred        Extremities: Normal range of motion.     Mental Status: Normal mood and affect. Normal behavior. Normal judgment and thought content.  US : GA = 31+6 weeks Single active female fetus, cephalic, FHR = 155 bpm, Left lateral pl, gr1, marginal placental cord insertion seen at Left posterior lower margin.  AFI = 14.8 cm, 53%, MVP = 6.2 cm,  EFW 72%, 2074g, nl ov's, cx closed  Assessment and Plan:  Pregnancy: G4P3003 at [redacted]w[redacted]d 1. Supervision of other normal pregnancy, antepartum (Primary) BP and FHR normal Doing well, feeling regular movement    2.  [redacted] weeks gestation of pregnancy GTT and labs today  Desires  BTL, consent signed today, consented previously with MD  3. Marginal insertion of umbilical cord affecting management of mother Ultrasound today, normal growth, follow up 38 wks  Preterm labor symptoms and general obstetric precautions including but not limited to vaginal bleeding, contractions, leaking of fluid and fetal movement were reviewed in detail with the patient. Please refer to After Visit Summary for other counseling recommendations.   Return in about 2 weeks (around 05/18/2024) for OB VISIT (MD or APP).  Future Appointments  Date Time Provider Department Center  05/17/2024  4:10 PM Ferd Householder, CNM [facility]    Aimee Eric, FNP

## 2024-05-04 NOTE — Progress Notes (Signed)
 US : GA = 31+6 weeks Single active female fetus, cephalic, FHR = 155 bpm, Left lateral pl, gr1, marginal placental cord insertion seen at Left posterior lower margin.  AFI = 14.8 cm, 53%, MVP = 6.2 cm,  EFW 72%, 2074g, nl ov's, cx closed

## 2024-05-13 DIAGNOSIS — Z419 Encounter for procedure for purposes other than remedying health state, unspecified: Secondary | ICD-10-CM | POA: Diagnosis not present

## 2024-05-17 ENCOUNTER — Encounter: Payer: Self-pay | Admitting: Women's Health

## 2024-05-17 ENCOUNTER — Ambulatory Visit: Admitting: Women's Health

## 2024-05-17 ENCOUNTER — Other Ambulatory Visit (HOSPITAL_COMMUNITY)
Admission: RE | Admit: 2024-05-17 | Discharge: 2024-05-17 | Disposition: A | Discharge status: Home / Self Care | Source: Ambulatory Visit | Attending: Women's Health | Admitting: Women's Health

## 2024-05-17 VITALS — BP 119/73 | HR 85 | Wt 201.0 lb

## 2024-05-17 DIAGNOSIS — Z331 Pregnant state, incidental: Secondary | ICD-10-CM

## 2024-05-17 DIAGNOSIS — Z3483 Encounter for supervision of other normal pregnancy, third trimester: Secondary | ICD-10-CM | POA: Diagnosis not present

## 2024-05-17 DIAGNOSIS — Z1389 Encounter for screening for other disorder: Secondary | ICD-10-CM

## 2024-05-17 DIAGNOSIS — Z3A33 33 weeks gestation of pregnancy: Secondary | ICD-10-CM

## 2024-05-17 DIAGNOSIS — N898 Other specified noninflammatory disorders of vagina: Secondary | ICD-10-CM | POA: Diagnosis present

## 2024-05-17 DIAGNOSIS — R300 Dysuria: Secondary | ICD-10-CM

## 2024-05-17 DIAGNOSIS — Z3480 Encounter for supervision of other normal pregnancy, unspecified trimester: Secondary | ICD-10-CM

## 2024-05-17 LAB — POCT URINALYSIS DIPSTICK OB
Blood, UA: NEGATIVE
Glucose, UA: NEGATIVE
Ketones, UA: NEGATIVE
Nitrite, UA: NEGATIVE
POC,PROTEIN,UA: NEGATIVE

## 2024-05-17 MED ORDER — NITROFURANTOIN MONOHYD MACRO 100 MG PO CAPS: 100.0000 mg | ORAL_CAPSULE | Freq: Two times a day (BID) | ORAL | 0 refills | Status: DC

## 2024-05-17 MED ORDER — PANTOPRAZOLE SODIUM 20 MG PO TBEC: 20.0000 mg | DELAYED_RELEASE_TABLET | Freq: Every day | ORAL | 2 refills | Status: DC

## 2024-05-17 NOTE — Progress Notes (Signed)
 LOW-RISK PREGNANCY VISIT Patient name: Aimee Burns MRN [id number]  Date of birth: December 05, 1990 Chief Complaint:   Routine Prenatal Visit (Urine odor,feel irritated,left leg swollen)  History of Present Illness:   Aimee Burns is a 33 y.o. [phone] female at [redacted]w[redacted]d with an Estimated Date of Delivery: 06/30/24 being seen today for ongoing management of a low-risk pregnancy.   Today she reports varicose veins Lt leg, dysuria and odor to urine, vaginal irritation, reflux- TUMS not helping, leg cramps. Contractions: Not present.  .  Movement: Present. denies leaking of fluid.     04/06/2024    9:30 AM 12/16/2023    1:49 PM 10/13/2020   10:05 AM  Depression screen PHQ 2/9  Decreased Interest 0 0 0  Down, Depressed, Hopeless 0 0 0  PHQ - 2 Score 0 0 0  Altered sleeping 0 0 0  Tired, decreased energy 0 0 0  Change in appetite 0 0 0  Feeling bad or failure about yourself  0 0 0  Trouble concentrating 0 0 0  Moving slowly or fidgety/restless 0 0 0  Suicidal thoughts 0 0 0  PHQ-9 Score 0 0 0        04/06/2024    9:30 AM 12/16/2023    1:49 PM 10/13/2020   10:06 AM  GAD 7 : Generalized Anxiety Score  Nervous, Anxious, on Edge 0 0 0  Control/stop worrying 0 0 0  Worry too much - different things 0 0 0  Trouble relaxing 0 0 0  Restless 0 0 0  Easily annoyed or irritable 0 0 0  Afraid - awful might happen 0 0 0  Total GAD 7 Score 0 0 0      Review of Systems:   Pertinent items are noted in HPI Denies abnormal vaginal discharge w/ itching/odor/irritation, headaches, visual changes, shortness of breath, chest pain, abdominal pain, severe nausea/vomiting, or problems with urination or bowel movements unless otherwise stated above. Pertinent History Reviewed:  Reviewed past medical,surgical, social, obstetrical and family history.  Reviewed problem list, medications and allergies. Physical Assessment:   Vitals:   05/17/24 1621  BP: 119/73  Pulse: 85  Weight: 201  lb (91.2 kg)  Body mass index is 30.56 kg/m.        Physical Examination:   General appearance: Well appearing, and in no distress  Mental status: Alert, oriented to person, place, and time  Skin: Warm & dry  Cardiovascular: Normal heart rate noted  Respiratory: Normal respiratory effort, no distress  Abdomen: Soft, gravid, nontender  Pelvic: spec exam: cx visually long/closed, CV swab         Extremities: Edema: Trace, varicose veins Lt upper leg  Fetal Status: Fetal Heart Rate (bpm): 147 Fundal Height: 33 cm Movement: Present    Chaperone: N/A Results for orders placed or performed in visit on 05/17/24 (from the past 24 hours)  POC Urinalysis Dipstick OB   Collection Time: 05/17/24  4:38 PM  Result Value Ref Range   Color, UA     Clarity, UA     Glucose, UA Negative Negative   Bilirubin, UA     Ketones, UA negative    Spec Grav, UA     Blood, UA negative    pH, UA     POC,PROTEIN,UA Negative Negative, Trace, Small (1+), Moderate (2+), Large (3+), 4+   Urobilinogen, UA     Nitrite, UA negative    Leukocytes, UA Small (1+) (A) Negative   Appearance  Odor      Assessment & Plan:  1) Low-risk pregnancy G4P3003 at [redacted]w[redacted]d with an Estimated Date of Delivery: 06/30/24   2) Marginal cord insertion w/ h/o FGR, EFW 72% @ 32w, rpt at 38w  3) Reflux> rx protonix  4) Urine odor/dysuria> rx macrobid , send cx  5) Vaginal irritation> CV swab  6) Varicose veins Lt leg> thigh high compression stockings  7) Leg cramps> discussed and gave printed info   Meds:  Meds ordered this encounter  Medications   pantoprazole (PROTONIX) 20 MG tablet    Sig: Take 1 tablet (20 mg total) by mouth daily.    Dispense:  30 tablet    Refill:  2   nitrofurantoin , macrocrystal-monohydrate, (MACROBID ) 100 MG capsule    Sig: Take 1 capsule (100 mg total) by mouth 2 (two) times daily. X 7 days    Dispense:  14 capsule    Refill:  0   Labs/procedures today: spec exam, CV swab, and urine  culture  Plan:  Continue routine obstetrical care  Next visit: prefers in person    Reviewed: Preterm labor symptoms and general obstetric precautions including but not limited to vaginal bleeding, contractions, leaking of fluid and fetal movement were reviewed in detail with the patient.  All questions were answered. Does have home bp cuff. Office bp cuff given: not applicable. Check bp weekly, let us  know if consistently >140 and/or >90.  Follow-up: Return in about 2 weeks (around 05/31/2024) for LROB, CNM, in person.  Future Appointments  Date Time Provider Department Center  05/31/2024  4:10 PM Ferd Householder, [place] [facility]  06/16/2024  8:30 AM [facility] - [facility] US  [facility] None  06/16/2024  9:30 AM Ferd Householder, CNM [facility]    Orders Placed This Encounter  Procedures   Urine Culture   POC Urinalysis Dipstick OB   Ferd Householder CNM, [facility] 05/17/2024 4:59 PM

## 2024-05-17 NOTE — Patient Instructions (Addendum)
 Aimee Burns, thank you for choosing our office today! We appreciate the opportunity to meet your healthcare needs. You may receive a short survey by mail, e-mail, or through [employer]. If you are happy with your care we would appreciate if you could take just a few minutes to complete the survey questions. We read all of your comments and take your feedback very seriously. Thank you again for choosing our office.  Center for [employer] Team at [facility]  [facility] at [facility] ([address] [place], [place] [address]) Entrance C, located off of [address] 24/7 valet parking   Tips to Help Leg Cramps Increase dietary sources of calcium (milk, yogurt, cheese, leafy greens, seafood, legumes, and fruit) and magnesium (dark leafy greens, nuts, seeds, fish, beans, whole grains, avocados, yogurt, bananas, dried fruit, dark chocolate) Spoonful of regular yellow mustard every night Pickle juice Magnesium supplement: 5mmol in the morning, 10mmol at night (can find in the vitamin aisle) Thiamine (Vit B1) 100mg  and pyridoxine (Vit B6) 40mg  daily for 2 weeks Dorsiflexion of foot: pointing your toes back towards your knee during the cramp      CLASSES: Go to [email or web address] to register for classes (childbirth, breastfeeding, waterbirth, infant CPR, daddy bootcamp, etc.)  Call the office [phone]) or go to [facility] if: You begin to have strong, frequent contractions Your water breaks.  Sometimes it is a big gush of fluid, sometimes it is just a trickle that keeps getting your panties wet or running down your legs You have vaginal bleeding.  It is normal to have a small amount of spotting if your cervix was checked.  You don't feel your baby moving like normal.  If you don't, get you something to eat and drink and lay down and focus on feeling your baby move.   If your baby is still not moving like normal, you should call the office or go to [facility].  Call  the office [phone]) or go to [facility] hospital for these signs of pre-eclampsia: Severe headache that does not go away with Tylenol  Visual changes- seeing spots, double, blurred vision Pain under your right breast or upper abdomen that does not go away with Tums or heartburn medicine Nausea and/or vomiting Severe swelling in your hands, feet, and face   Tdap Vaccine It is recommended that you get the Tdap vaccine during the third trimester of EACH pregnancy to help protect your baby from getting pertussis (whooping cough) 27-36 weeks is the BEST time to do this so that you can pass the protection on to your baby. During pregnancy is better than after pregnancy, but if you are unable to get it during pregnancy it will be offered at the hospital.  You can get this vaccine with us , at the health department, your family doctor, or some local pharmacies Everyone who will be around your baby should also be up-to-date on their vaccines before the baby comes. Adults (who are not pregnant) only need 1 dose of Tdap during adulthood.   [facility] Pediatricians/Family Doctors [facility]): [address] Meg Spina, [phone]           [facility]: [address], [phone]                [facility]): [address], [phone] (call to ask if accepting patients) [facility] Department: [address], [place], [phone]    [facility] Pediatricians/Family Doctors  [facility]): [address], [phone] [facility]: [address] [place], [phone] [facility]: [address], [phone]  [facility] Doctors  [facility]): [phone] [facility]: [address] [phone]   [facility] Doctors [facility]: [address], [phone]  [facility] Doctors   [facility]: [phone], [phone]  Home Blood Pressure Monitoring for Patients   Your provider has recommended that you check your blood pressure (BP) at least once a week at home. If you do not have a blood pressure cuff at home, one will be provided for you. Contact your provider if you have not received your monitor within 1 week.   Helpful Tips for Accurate Home Blood Pressure Checks  Don't smoke, exercise, or drink caffeine 30 minutes before checking your BP Use the restroom before checking your BP (a full bladder can raise your pressure) Relax in a comfortable upright chair Feet on the ground Left arm resting comfortably on a flat surface at the level of your heart Legs uncrossed Back supported Sit quietly and don't talk Place the cuff on your bare arm Adjust snuggly, so that only two fingertips can fit between your skin and the top of the cuff Check 2 readings separated by at least one minute Keep a log of your BP readings For a visual, please reference this diagram: http://ccnc.care/bpdiagram  Provider Name: [facility]     Phone: [phone]  Zone 1: ALL CLEAR  Continue to monitor your symptoms:  BP reading is less than 140 (top number) or less than 90 (bottom number)  No right upper stomach pain No headaches or seeing spots No feeling nauseated or throwing up No swelling in face and hands  Zone 2: CAUTION Call your doctor's office for any of the following:  BP reading is greater than 140 (top number) or greater than 90 (bottom number)  Stomach pain under your ribs in the middle or right side Headaches or seeing spots Feeling nauseated or throwing up Swelling in face and hands  Zone 3: EMERGENCY  Seek immediate medical care if you have any of the following:  BP reading is greater than160 (top number) or greater than 110 (bottom number) Severe headaches not improving with Tylenol  Serious difficulty catching your breath Any worsening  symptoms from Zone 2  Preterm Labor and Birth Information  The normal length of a pregnancy is 39-41 weeks. Preterm labor is when labor starts before 37 completed weeks of pregnancy. What are the risk factors for preterm labor? Preterm labor is more likely to occur in women who: Have certain infections during pregnancy such as a bladder infection, sexually transmitted infection, or infection inside the uterus (chorioamnionitis). Have a shorter-than-normal cervix. Have gone into preterm labor before. Have had surgery on their cervix. Are younger than age 99 or older than age 60. Are African American. Are pregnant with twins or multiple babies (multiple gestation). Take street drugs or smoke while pregnant. Do not gain enough weight while pregnant. Became pregnant shortly after having been pregnant. What are the symptoms of preterm labor? Symptoms of preterm labor include: Cramps similar to those that can happen during a menstrual period. The cramps may happen with diarrhea. Pain in the abdomen or lower back. Regular uterine contractions that may feel like tightening of the abdomen. A feeling of increased pressure in the pelvis. Increased watery or bloody mucus discharge  from the vagina. Water breaking (ruptured amniotic sac). Why is it important to recognize signs of preterm labor? It is important to recognize signs of preterm labor because babies who are born prematurely may not be fully developed. This can put them at an increased risk for: Long-term (chronic) heart and lung problems. Difficulty immediately after birth with regulating body systems, including blood sugar, body temperature, heart rate, and breathing rate. Bleeding in the brain. Cerebral palsy. Learning difficulties. Death. These risks are highest for babies who are born before 34 weeks of pregnancy. How is preterm labor treated? Treatment depends on the length of your pregnancy, your condition, and the health of your  baby. It may involve: Having a stitch (suture) placed in your cervix to prevent your cervix from opening too early (cerclage). Taking or being given medicines, such as: Hormone medicines. These may be given early in pregnancy to help support the pregnancy. Medicine to stop contractions. Medicines to help mature the baby's lungs. These may be prescribed if the risk of delivery is high. Medicines to prevent your baby from developing cerebral palsy. If the labor happens before 34 weeks of pregnancy, you may need to stay in the hospital. What should I do if I think I am in preterm labor? If you think that you are going into preterm labor, call your health care provider right away. How can I prevent preterm labor in future pregnancies? To increase your chance of having a full-term pregnancy: Do not use any tobacco products, such as cigarettes, chewing tobacco, and e-cigarettes. If you need help quitting, ask your health care provider. Do not use street drugs or medicines that have not been prescribed to you during your pregnancy. Talk with your health care provider before taking any herbal supplements, even if you have been taking them regularly. Make sure you gain a healthy amount of weight during your pregnancy. Watch for infection. If you think that you might have an infection, get it checked right away. Make sure to tell your health care provider if you have gone into preterm labor before. This information is not intended to replace advice given to you by your health care provider. Make sure you discuss any questions you have with your health care provider. Document Revised: 03/12/2019 Document Reviewed: 04/10/2016 Elsevier Patient Education  2020 [employer].

## 2024-05-19 ENCOUNTER — Encounter: Payer: Self-pay | Admitting: Women's Health

## 2024-05-19 ENCOUNTER — Other Ambulatory Visit: Payer: Self-pay | Admitting: Women's Health

## 2024-05-19 ENCOUNTER — Ambulatory Visit: Payer: Self-pay | Admitting: Women's Health

## 2024-05-19 LAB — CERVICOVAGINAL ANCILLARY ONLY
Bacterial Vaginitis (gardnerella): POSITIVE — AB
Candida Glabrata: NEGATIVE
Candida Vaginitis: NEGATIVE
Chlamydia: NEGATIVE
Comment: NEGATIVE
Comment: NEGATIVE
Comment: NEGATIVE
Comment: NEGATIVE
Comment: NEGATIVE
Comment: NORMAL
Neisseria Gonorrhea: NEGATIVE
Trichomonas: NEGATIVE

## 2024-05-19 MED ORDER — PREDNISONE 20 MG PO TABS: 20.0000 mg | ORAL_TABLET | Freq: Every day | ORAL | 0 refills | Status: DC

## 2024-05-19 MED ORDER — METRONIDAZOLE 500 MG PO TABS: 500.0000 mg | ORAL_TABLET | Freq: Two times a day (BID) | ORAL | 0 refills | Status: DC

## 2024-05-20 LAB — URINE CULTURE

## 2024-05-31 ENCOUNTER — Encounter: Payer: Self-pay | Admitting: Women's Health

## 2024-05-31 ENCOUNTER — Ambulatory Visit: Admitting: Women's Health

## 2024-05-31 VITALS — BP 131/76 | HR 98 | Wt 206.4 lb

## 2024-05-31 DIAGNOSIS — Z3483 Encounter for supervision of other normal pregnancy, third trimester: Secondary | ICD-10-CM

## 2024-05-31 DIAGNOSIS — Z3A35 35 weeks gestation of pregnancy: Secondary | ICD-10-CM

## 2024-05-31 DIAGNOSIS — Z3480 Encounter for supervision of other normal pregnancy, unspecified trimester: Secondary | ICD-10-CM

## 2024-05-31 NOTE — Progress Notes (Signed)
 LOW-RISK PREGNANCY VISIT Patient name: Aimee Burns MRN [id number]  Date of birth: February 24, 1991 Chief Complaint:   Routine Prenatal Visit (culture)  History of Present Illness:   Aimee Burns is a 32 y.o. [phone] female at [redacted]w[redacted]d with an Estimated Date of Delivery: 06/30/24 being seen today for ongoing management of a low-risk pregnancy.   Today she reports lots of pressure. Contractions: Not present.  .  Movement: Present. denies leaking of fluid.     04/06/2024    9:30 AM 12/16/2023    1:49 PM 10/13/2020   10:05 AM  Depression screen PHQ 2/9  Decreased Interest 0 0 0  Down, Depressed, Hopeless 0 0 0  PHQ - 2 Score 0 0 0  Altered sleeping 0 0 0  Tired, decreased energy 0 0 0  Change in appetite 0 0 0  Feeling bad or failure about yourself  0 0 0  Trouble concentrating 0 0 0  Moving slowly or fidgety/restless 0 0 0  Suicidal thoughts 0 0 0  PHQ-9 Score 0 0 0        04/06/2024    9:30 AM 12/16/2023    1:49 PM 10/13/2020   10:06 AM  GAD 7 : Generalized Anxiety Score  Nervous, Anxious, on Edge 0 0 0  Control/stop worrying 0 0 0  Worry too much - different things 0 0 0  Trouble relaxing 0 0 0  Restless 0 0 0  Easily annoyed or irritable 0 0 0  Afraid - awful might happen 0 0 0  Total GAD 7 Score 0 0 0      Review of Systems:   Pertinent items are noted in HPI Denies abnormal vaginal discharge w/ itching/odor/irritation, headaches, visual changes, shortness of breath, chest pain, abdominal pain, severe nausea/vomiting, or problems with urination or bowel movements unless otherwise stated above. Pertinent History Reviewed:  Reviewed past medical,surgical, social, obstetrical and family history.  Reviewed problem list, medications and allergies. Physical Assessment:   Vitals:   05/31/24 1619  BP: 131/76  Pulse: 98  Weight: 206 lb 6.4 oz (93.6 kg)  Body mass index is 31.38 kg/m.        Physical Examination:   General appearance: Well appearing,  and in no distress  Mental status: Alert, oriented to person, place, and time  Skin: Warm & dry  Cardiovascular: Normal heart rate noted  Respiratory: Normal respiratory effort, no distress  Abdomen: Soft, gravid, nontender  Pelvic: Cervical exam performed per request  Dilation: 1 Effacement (%): Thick Station: -3  Extremities: Edema: Trace  Fetal Status: Fetal Heart Rate (bpm): 150 Fundal Height: 36 cm Movement: Present Presentation: Vertex  Chaperone: Peggy Dones No results found for this or any previous visit (from the past 24 hours).  Assessment & Plan:  1) Low-risk pregnancy G4P3003 at [redacted]w[redacted]d with an Estimated Date of Delivery: 06/30/24   2) Marginal cord, EFW 72% at 32w, repeat next visit as scheduled   Meds: No orders of the defined types were placed in this encounter.  Labs/procedures today: SVE  Plan:  Continue routine obstetrical care  Next visit: prefers will be in person for cultures    Reviewed: Preterm labor symptoms and general obstetric precautions including but not limited to vaginal bleeding, contractions, leaking of fluid and fetal movement were reviewed in detail with the patient.  All questions were answered. Does have home bp cuff. Office bp cuff given: not applicable. Check bp daily, let us  know if consistently >140 and/or >  90 or pre-e s/s.  Follow-up: Return for LROB, weekly, CNM, in person.  Future Appointments  Date Time Provider Department Center  06/16/2024  8:30 AM [facility] - [facility] US  [facility] None  06/16/2024  9:30 AM Kizzie Suzen SAUNDERS, CNM [facility]    No orders of the defined types were placed in this encounter.  Suzen SAUNDERS Kizzie CNM, [name] 05/31/2024 4:47 PM

## 2024-05-31 NOTE — Patient Instructions (Signed)
 Aimee Burns, thank you for choosing our office today! We appreciate the opportunity to meet your healthcare needs. You may receive a short survey by mail, e-mail, or through [employer]. If you are happy with your care we would appreciate if you could take just a few minutes to complete the survey questions. We read all of your comments and take your feedback very seriously. Thank you again for choosing our office.  Center for [employer] Team at [facility]  [facility] at [facility] ([address] [place], [place] [address]) Entrance C, located off of [address] Free 24/7 valet parking   CLASSES: Go to [email or web address] to register for classes (childbirth, breastfeeding, waterbirth, infant CPR, daddy bootcamp, etc.)  Call the office [phone]) or go to [facility] if: You begin to have strong, frequent contractions Your water breaks.  Sometimes it is a big gush of fluid, sometimes it is just a trickle that keeps getting your panties wet or running down your legs You have vaginal bleeding.  It is normal to have a small amount of spotting if your cervix was checked.  You don't feel your baby moving like normal.  If you don't, get you something to eat and drink and lay down and focus on feeling your baby move.   If your baby is still not moving like normal, you should call the office or go to [facility].  Call the office [phone]) or go to [facility] hospital for these signs of pre-eclampsia: Severe headache that does not go away with Tylenol Visual changes- seeing spots, double, blurred vision Pain under your right breast or upper abdomen that does not go away with Tums or heartburn medicine Nausea and/or vomiting Severe swelling in your hands, feet, and face   Tdap Vaccine It is recommended that you get the Tdap vaccine during the third trimester of EACH pregnancy to help protect your baby from getting pertussis (whooping cough) 27-36 weeks is the BEST time to do  this so that you can pass the protection on to your baby. During pregnancy is better than after pregnancy, but if you are unable to get it during pregnancy it will be offered at the hospital.  You can get this vaccine with [place], at the health department, your family doctor, or some local pharmacies Everyone who will be around your baby should also be up-to-date on their vaccines before the baby comes. Adults (who are not pregnant) only need 1 dose of Tdap during adulthood.   [facility] Pediatricians/Family Doctors [facility]): [address] Colette Ribas, [phone]           [facility]: [address], [phone]                [facility] Medicine [facility]): [address], [phone] (call to ask if accepting patients) [facility] Department: [address], [place], [phone]    [facility] Pediatricians/Family Doctors [facility]): [phone], [phone] [facility]: [address] [place], [phone] [facility]: [address], [phone]  [facility] Doctors  [facility]): [phone] [facility]: [address] [phone]   [facility] Doctors [facility]: [address], [phone]  [facility] Family Doctors  [facility]: [phone], [phone]  Home Blood Pressure Monitoring for Patients   Your provider has recommended that you check your  blood pressure (BP) at least once a week at home. If you do not have a blood pressure cuff at home, one will be provided for you. Contact your provider if you have not received your monitor within 1 week.   Helpful Tips for Accurate Home Blood Pressure Checks  Don't smoke, exercise, or drink caffeine 30 minutes before checking your BP Use the restroom before checking your BP (a full bladder can raise your  pressure) Relax in a comfortable upright chair Feet on the ground Left arm resting comfortably on a flat surface at the level of your heart Legs uncrossed Back supported Sit quietly and don't talk Place the cuff on your bare arm Adjust snuggly, so that only two fingertips can fit between your skin and the top of the cuff Check 2 readings separated by at least one minute Keep a log of your BP readings For a visual, please reference this diagram: http://ccnc.care/bpdiagram  Provider Name: [facility]     Phone: [phone]  Zone 1: ALL CLEAR  Continue to monitor your symptoms:  BP reading is less than 140 (top number) or less than 90 (bottom number)  No right upper stomach pain No headaches or seeing spots No feeling nauseated or throwing up No swelling in face and hands  Zone 2: CAUTION Call your doctor's office for any of the following:  BP reading is greater than 140 (top number) or greater than 90 (bottom number)  Stomach pain under your ribs in the middle or right side Headaches or seeing spots Feeling nauseated or throwing up Swelling in face and hands  Zone 3: EMERGENCY  Seek immediate medical care if you have any of the following:  BP reading is greater than160 (top number) or greater than 110 (bottom number) Severe headaches not improving with Tylenol Serious difficulty catching your breath Any worsening symptoms from Zone 2  Preterm Labor and Birth Information  The normal length of a pregnancy is 39-41 weeks. Preterm labor is when labor starts before 37 completed weeks of pregnancy. What are the risk factors for preterm labor? Preterm labor is more likely to occur in women who: Have certain infections during pregnancy such as a bladder infection, sexually transmitted infection, or infection inside the uterus (chorioamnionitis). Have a shorter-than-normal cervix. Have gone into preterm labor before. Have had surgery on their cervix. Are younger than age 40  or older than age 67. Are African American. Are pregnant with twins or multiple babies (multiple gestation). Take street drugs or smoke while pregnant. Do not gain enough weight while pregnant. Became pregnant shortly after having been pregnant. What are the symptoms of preterm labor? Symptoms of preterm labor include: Cramps similar to those that can happen during a menstrual period. The cramps may happen with diarrhea. Pain in the abdomen or lower back. Regular uterine contractions that may feel like tightening of the abdomen. A feeling of increased pressure in the pelvis. Increased watery or bloody mucus discharge from the vagina. Water breaking (ruptured amniotic sac). Why is it important to recognize signs of preterm labor? It is important to recognize signs of preterm labor because babies who are born prematurely may not be fully developed. This can put them at an increased risk for: Long-term (chronic) heart and lung problems. Difficulty immediately after birth with regulating body systems, including blood sugar, body temperature, heart rate, and breathing rate. Bleeding in the brain. Cerebral palsy. Learning difficulties. Death. These risks are highest for babies who are born before 34 weeks  of pregnancy. How is preterm labor treated? Treatment depends on the length of your pregnancy, your condition, and the health of your baby. It may involve: Having a stitch (suture) placed in your cervix to prevent your cervix from opening too early (cerclage). Taking or being given medicines, such as: Hormone medicines. These may be given early in pregnancy to help support the pregnancy. Medicine to stop contractions. Medicines to help mature the baby's lungs. These may be prescribed if the risk of delivery is high. Medicines to prevent your baby from developing cerebral palsy. If the labor happens before 34 weeks of pregnancy, you may need to stay in the hospital. What should I do if I  think I am in preterm labor? If you think that you are going into preterm labor, call your health care provider right away. How can I prevent preterm labor in future pregnancies? To increase your chance of having a full-term pregnancy: Do not use any tobacco products, such as cigarettes, chewing tobacco, and e-cigarettes. If you need help quitting, ask your health care provider. Do not use street drugs or medicines that have not been prescribed to you during your pregnancy. Talk with your health care provider before taking any herbal supplements, even if you have been taking them regularly. Make sure you gain a healthy amount of weight during your pregnancy. Watch for infection. If you think that you might have an infection, get it checked right away. Make sure to tell your health care provider if you have gone into preterm labor before. This information is not intended to replace advice given to you by your health care provider. Make sure you discuss any questions you have with your health care provider. Document Revised: 03/12/2019 Document Reviewed: 04/10/2016 Elsevier Patient Education  2020 [employer].

## 2024-06-01 ENCOUNTER — Encounter: Payer: Self-pay | Admitting: Women's Health

## 2024-06-08 ENCOUNTER — Encounter: Admitting: Obstetrics & Gynecology

## 2024-06-09 ENCOUNTER — Ambulatory Visit: Admitting: Advanced Practice Midwife

## 2024-06-09 VITALS — BP 134/79 | HR 108 | Wt 203.0 lb

## 2024-06-09 DIAGNOSIS — Z3A37 37 weeks gestation of pregnancy: Secondary | ICD-10-CM | POA: Diagnosis not present

## 2024-06-09 DIAGNOSIS — Z3483 Encounter for supervision of other normal pregnancy, third trimester: Secondary | ICD-10-CM

## 2024-06-09 DIAGNOSIS — O43193 Other malformation of placenta, third trimester: Secondary | ICD-10-CM

## 2024-06-09 DIAGNOSIS — O43199 Other malformation of placenta, unspecified trimester: Secondary | ICD-10-CM | POA: Insufficient documentation

## 2024-06-09 DIAGNOSIS — Z3480 Encounter for supervision of other normal pregnancy, unspecified trimester: Secondary | ICD-10-CM

## 2024-06-09 LAB — POCT URINALYSIS DIPSTICK OB
Blood, UA: NEGATIVE
Glucose, UA: NEGATIVE
Ketones, UA: NEGATIVE
Nitrite, UA: NEGATIVE

## 2024-06-09 MED ORDER — AZITHROMYCIN 250 MG PO TABS
ORAL_TABLET | ORAL | 0 refills | Status: DC
Start: 2024-06-09 — End: 2024-06-28

## 2024-06-09 NOTE — Progress Notes (Signed)
 LOW-RISK PREGNANCY VISIT Patient name: Aimee Burns MRN [id number]  Date of birth: 01-22-1991 Chief Complaint:   Routine Prenatal Visit  History of Present Illness:   Aimee Burns is a 33 y.o. [phone] female at [redacted]w[redacted]d with an Estimated Date of Delivery: 06/30/24 being seen today for ongoing management of a low-risk pregnancy.  Today she reports cough/congestion x few days- no fever; no s/s pre-e. Contractions: Not present. Vag. Bleeding: None.  Movement: Present. denies leaking of fluid. Review of Systems:   Pertinent items are noted in HPI Denies abnormal vaginal discharge w/ itching/odor/irritation, headaches, visual changes, shortness of breath, chest pain, abdominal pain, severe nausea/vomiting, or problems with urination or bowel movements unless otherwise stated above. Pertinent History Reviewed:  Reviewed past medical,surgical, social, obstetrical and family history.  Reviewed problem list, medications and allergies. Physical Assessment:   Vitals:   06/09/24 1515 06/09/24 1517 06/09/24 1520  BP: (!) 143/81 137/87 134/79  Pulse: (!) 108    Weight:   203 lb (92.1 kg)  Body mass index is 30.87 kg/m.        Physical Examination:   General appearance: Well appearing, and in no distress  Mental status: Alert, oriented to person, place, and time  Skin: Warm & dry  Cardiovascular: Normal heart rate noted  Respiratory: Normal respiratory effort, no distress  Abdomen: Soft, gravid, nontender  Pelvic: Cervical exam performed  Dilation: 1 Effacement (%): 50 Station: -3  Extremities:    Fetal Status: Fetal Heart Rate (bpm): 154   Movement: Present Presentation: Vertex  Results for orders placed or performed in visit on 06/09/24 (from the past 24 hours)  POC Urinalysis Dipstick OB   Collection Time: 06/09/24  3:24 PM  Result Value Ref Range   Color, UA     Clarity, UA     Glucose, UA Negative Negative   Bilirubin, UA     Ketones, UA neg    Spec Grav, UA      Blood, UA neg    pH, UA     POC,PROTEIN,UA Trace Negative, Trace, Small (1+), Moderate (2+), Large (3+), 4+   Urobilinogen, UA     Nitrite, UA neg    Leukocytes, UA Small (1+) (A) Negative   Appearance     Odor      Assessment & Plan:  1) Low-risk pregnancy G4P3003 at [redacted]w[redacted]d with an Estimated Date of Delivery: 06/30/24   2) Marg cord insertion, EFW 72% @ 32wks; has another EFW next week  3) Wants BTS, consent under media (05/04/24)  4) First BP ^, then normal x 2, trace protein and asymptomatic; 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  All questions were answered. Has home bp cuff. Check bp weekly, let us  know if >140/90.   Follow-up: Return for As scheduled.  Orders Placed This Encounter  Procedures   Culture, beta strep (group b only)   POC Urinalysis Dipstick OB   Suzen JONETTA Gentry [facility] 06/09/2024 3:45 PM

## 2024-06-12 DIAGNOSIS — Z419 Encounter for procedure for purposes other than remedying health state, unspecified: Secondary | ICD-10-CM | POA: Diagnosis not present

## 2024-06-13 ENCOUNTER — Encounter: Payer: Self-pay | Admitting: Advanced Practice Midwife

## 2024-06-13 DIAGNOSIS — B951 Streptococcus, group B, as the cause of diseases classified elsewhere: Secondary | ICD-10-CM | POA: Insufficient documentation

## 2024-06-13 LAB — CULTURE, BETA STREP (GROUP B ONLY): Strep Gp B Culture: POSITIVE — AB

## 2024-06-15 ENCOUNTER — Other Ambulatory Visit: Payer: Self-pay | Admitting: Obstetrics & Gynecology

## 2024-06-15 DIAGNOSIS — O09299 Supervision of pregnancy with other poor reproductive or obstetric history, unspecified trimester: Secondary | ICD-10-CM

## 2024-06-16 ENCOUNTER — Ambulatory Visit: Admitting: Women's Health

## 2024-06-16 ENCOUNTER — Other Ambulatory Visit

## 2024-06-16 ENCOUNTER — Encounter: Payer: Self-pay | Admitting: Women's Health

## 2024-06-16 VITALS — BP 126/81 | HR 80 | Wt 207.4 lb

## 2024-06-16 DIAGNOSIS — O09299 Supervision of pregnancy with other poor reproductive or obstetric history, unspecified trimester: Secondary | ICD-10-CM

## 2024-06-16 DIAGNOSIS — Z3480 Encounter for supervision of other normal pregnancy, unspecified trimester: Secondary | ICD-10-CM

## 2024-06-16 DIAGNOSIS — B951 Streptococcus, group B, as the cause of diseases classified elsewhere: Secondary | ICD-10-CM

## 2024-06-16 DIAGNOSIS — O09293 Supervision of pregnancy with other poor reproductive or obstetric history, third trimester: Secondary | ICD-10-CM

## 2024-06-16 DIAGNOSIS — Z3483 Encounter for supervision of other normal pregnancy, third trimester: Secondary | ICD-10-CM

## 2024-06-16 DIAGNOSIS — O43199 Other malformation of placenta, unspecified trimester: Secondary | ICD-10-CM

## 2024-06-16 DIAGNOSIS — Z3A38 38 weeks gestation of pregnancy: Secondary | ICD-10-CM

## 2024-06-16 DIAGNOSIS — L299 Pruritus, unspecified: Secondary | ICD-10-CM

## 2024-06-16 NOTE — Progress Notes (Signed)
 LOW-RISK PREGNANCY VISIT Patient name: Aimee Burns MRN [id number]  Date of birth: 1991-11-22 Chief Complaint:   Routine Prenatal Visit and Pregnancy Ultrasound (Cervical check)  History of Present Illness:   Aimee Burns is a 33 y.o. [phone] female at [redacted]w[redacted]d with an Estimated Date of Delivery: 06/30/24 being seen today for ongoing management of a low-risk pregnancy.   Today she reports itching all over, not necessarily worse at night. Home bp's wnl. Swelling legs.. Contractions: Irritability.  .  Movement: Present. denies leaking of fluid.     04/06/2024    9:30 AM 12/16/2023    1:49 PM 10/13/2020   10:05 AM  Depression screen PHQ 2/9  Decreased Interest 0 0 0  Down, Depressed, Hopeless 0 0 0  PHQ - 2 Score 0 0 0  Altered sleeping 0 0 0  Tired, decreased energy 0 0 0  Change in appetite 0 0 0  Feeling bad or failure about yourself  0 0 0  Trouble concentrating 0 0 0  Moving slowly or fidgety/restless 0 0 0  Suicidal thoughts 0 0 0  PHQ-9 Score 0 0 0        04/06/2024    9:30 AM 12/16/2023    1:49 PM 10/13/2020   10:06 AM  GAD 7 : Generalized Anxiety Score  Nervous, Anxious, on Edge 0 0 0  Control/stop worrying 0 0 0  Worry too much - different things 0 0 0  Trouble relaxing 0 0 0  Restless 0 0 0  Easily annoyed or irritable 0 0 0  Afraid - awful might happen 0 0 0  Total GAD 7 Score 0 0 0      Review of Systems:   Pertinent items are noted in HPI Denies abnormal vaginal discharge w/ itching/odor/irritation, headaches, visual changes, shortness of breath, chest pain, abdominal pain, severe nausea/vomiting, or problems with urination or bowel movements unless otherwise stated above. Pertinent History Reviewed:  Reviewed past medical,surgical, social, obstetrical and family history.  Reviewed problem list, medications and allergies. Physical Assessment:   Vitals:   06/16/24 0914  BP: 126/81  Pulse: 80  Weight: 207 lb 6.4 oz (94.1 kg)  Body  mass index is 31.54 kg/m.        Physical Examination:   General appearance: Well appearing, and in no distress  Mental status: Alert, oriented to person, place, and time  Skin: Warm & dry  Cardiovascular: Normal heart rate noted  Respiratory: Normal respiratory effort, no distress  Abdomen: Soft, gravid, nontender  Pelvic: Cervical exam performed  Dilation: 3 Effacement (%): 50 Station: -2  Extremities: Edema: Trace  Fetal Status:     Movement: Present Presentation: VertexUS 38 wks,cephalic,left lateral placenta gr 0,marginal CI not seen on today scan,FHR 138 BPM,AFI 17 cm,EFW 3940 g 95%   Chaperone: Peggy Dones No results found for this or any previous visit (from the past 24 hours).  Assessment & Plan:  1) Low-risk pregnancy G4P3003 at [redacted]w[redacted]d with an Estimated Date of Delivery: 06/30/24   2) Marginal cord insertion, EFW today 95%  3) Suspected LGA> EFW 95% today, extrapolates to 4165g @ 39w, last baby 3805g @ 39.4 w/o difficulty  4) Itching> fasting today, will check ICP labs   Meds: No orders of the defined types were placed in this encounter.  Labs/procedures today: SVE and U/S  Plan:  Continue routine obstetrical care  Next visit: prefers in person    Reviewed: Term labor symptoms and general obstetric precautions  including but not limited to vaginal bleeding, contractions, leaking of fluid and fetal movement were reviewed in detail with the patient.  All questions were answered. Does have home bp cuff. Office bp cuff given: not applicable. Check bp daily, let us  know if consistently >140 and/or >90, reviewed pre-e s/s, reasons to seek care.  Follow-up: Return in about 1 week (around 06/23/2024) for LROB, CNM, in person.  Future Appointments  Date Time Provider Department Center  06/23/2024  2:50 PM Kizzie Suzen SAUNDERS, [place] [facility]  06/30/2024  3:30 PM Delores Nidia CROME, FNP [facility]    Orders Placed This Encounter  Procedures   Comprehensive metabolic panel  with GFR   Bile acids, total   Suzen SAUNDERS Kizzie CNM, [facility] 06/16/2024 9:42 AM

## 2024-06-16 NOTE — Patient Instructions (Signed)
Aimee Burns, thank you for choosing our office today! We appreciate the opportunity to meet your healthcare needs. You may receive a short survey by mail, e-mail, or through MyChart. If you are happy with your care we would appreciate if you could take just a few minutes to complete the survey questions. We read all of your comments and take your feedback very seriously. Thank you again for choosing our office.  [facility]  [facility] ([address] [place], [place] [address]) Entrance C, located off of [address] Free 24/7 valet parking   CLASSES: Go to [email or web address] to register for classes (childbirth, breastfeeding, waterbirth, infant CPR, daddy bootcamp, etc.)  Call the office ([phone]) or go to [facility] if: You begin to have strong, frequent contractions Your water breaks.  Sometimes it is a big gush of fluid, sometimes it is just a trickle that keeps getting your panties wet or running down your legs You have vaginal bleeding.  It is normal to have a small amount of spotting if your cervix was checked.  You don't feel your baby moving like normal.  If you don't, get you something to eat and drink and lay down and focus on feeling your baby move.   If your baby is still not moving like normal, you should call the office or go to [facility].  Call the office ([phone]) or go to [facility] for these signs of pre-eclampsia: Severe headache that does not go away with Tylenol Visual changes- seeing spots, double, blurred vision Pain under your right breast or upper abdomen that does not go away with Tums or heartburn medicine Nausea and/or vomiting Severe swelling in your hands, feet, and face   [place] [facility]/Family Doctors [facility] ([facility]): [address], [phone]           [facility]: [address], [phone]                  [facility] ([facility]): [address], [phone] (call to ask if accepting patients) [facility]: [address], [place], [phone]    [place] Pediatric[facility]/Family Doctors [facility] ([facility]): [address], [phone] [facility]: [address], [phone] [facility]: [address], [phone]  [place] [facility]  [facility] ([facility]): [phone] [facility]: [address], [phone]   [place] [facility]: [address], [phone]  [place] [facility]  [facility]: [address], [phone]  Home Blood Pressure Monitoring for Patients   Your provider has recommended that you check your blood pressure (BP) at least once a week at home. If you do not have a blood pressure cuff at home, one will be provided for you. Contact your provider if you have not received your monitor within 1 week.   Helpful Tips for Accurate Home Blood Pressure Checks  Don't smoke, exercise, or drink caffeine 30 minutes before checking your BP Use the restroom before checking your BP (a full bladder can raise your pressure) Relax in a comfortable upright chair Feet on the ground Left arm resting comfortably on a flat surface at the level of your heart Legs uncrossed Back supported Sit quietly and don't talk Place the cuff on your bare arm Adjust snuggly, so that only two fingertips   can fit between your skin and the top of the cuff Check 2 readings separated by at least one minute Keep a log of your BP readings For a visual, please reference this diagram: http://ccnc.care/bpdiagram  Provider Name: [facility]     Phone: [phone]  Zone 1: ALL CLEAR  Continue to monitor your symptoms:  BP reading is less than 140 (top number) or less than 90 (bottom number)  No right  upper stomach pain No headaches or seeing spots No feeling nauseated or throwing up No swelling in face and hands  Zone 2: CAUTION Call your doctor's office for any of the following:  BP reading is greater than 140 (top number) or greater than 90 (bottom number)  Stomach pain under your ribs in the middle or right side Headaches or seeing spots Feeling nauseated or throwing up Swelling in face and hands  Zone 3: EMERGENCY  Seek immediate medical care if you have any of the following:  BP reading is greater than160 (top number) or greater than 110 (bottom number) Severe headaches not improving with Tylenol Serious difficulty catching your breath Any worsening symptoms from Zone 2   Braxton Hicks Contractions Contractions of the uterus can occur throughout pregnancy, but they are not always a sign that you are in labor. You may have practice contractions called Braxton Hicks contractions. These false labor contractions are sometimes confused with true labor. What are Braxton Hicks contractions? Braxton Hicks contractions are tightening movements that occur in the muscles of the uterus before labor. Unlike true labor contractions, these contractions do not result in opening (dilation) and thinning of the cervix. Toward the end of pregnancy (32-34 weeks), Braxton Hicks contractions can happen more often and may become stronger. These contractions are sometimes difficult to tell apart from true labor because they can be very uncomfortable. You should not feel embarrassed if you go to the hospital with false labor. Sometimes, the only way to tell if you are in true labor is for your health care provider to look for changes in the cervix. The health care provider will do a physical exam and may monitor your contractions. If you are not in true labor, the exam should show that your cervix is not dilating and your water has not broken. If there are no other health problems associated with your  pregnancy, it is completely safe for you to be sent home with false labor. You may continue to have Braxton Hicks contractions until you go into true labor. How to tell the difference between true labor and false labor True labor Contractions last 30-70 seconds. Contractions become very regular. Discomfort is usually felt in the top of the uterus, and it spreads to the lower abdomen and low back. Contractions do not go away with walking. Contractions usually become more intense and increase in frequency. The cervix dilates and gets thinner. False labor Contractions are usually shorter and not as strong as true labor contractions. Contractions are usually irregular. Contractions are often felt in the front of the lower abdomen and in the groin. Contractions may go away when you walk around or change positions while lying down. Contractions get weaker and are shorter-lasting as time goes on. The cervix usually does not dilate or become thin. Follow these instructions at home:  Take over-the-counter and prescription medicines only as told by your health care provider. Keep up with your usual exercises and follow other instructions from your health care provider. Eat and drink lightly if you think   you are going into labor. If Braxton Hicks contractions are making you uncomfortable: Change your position from lying down or resting to walking, or change from walking to resting. Sit and rest in a tub of warm water. Drink enough fluid to keep your urine pale yellow. Dehydration may cause these contractions. Do slow and deep breathing several times an hour. Keep all follow-up prenatal visits as told by your health care provider. This is important. Contact a health care provider if: You have a fever. You have continuous pain in your abdomen. Get help right away if: Your contractions become stronger, more regular, and closer together. You have fluid leaking or gushing from your vagina. You pass  blood-tinged mucus (bloody show). You have bleeding from your vagina. You have low back pain that you never had before. You feel your baby's head pushing down and causing pelvic pressure. Your baby is not moving inside you as much as it used to. Summary Contractions that occur before labor are called Braxton Hicks contractions, false labor, or practice contractions. Braxton Hicks contractions are usually shorter, weaker, farther apart, and less regular than true labor contractions. True labor contractions usually become progressively stronger and regular, and they become more frequent. Manage discomfort from Braxton Hicks contractions by changing position, resting in a warm bath, drinking plenty of water, or practicing deep breathing. This information is not intended to replace advice given to you by your health care provider. Make sure you discuss any questions you have with your health care provider. Document Revised: 10/31/2017 Document Reviewed: 04/03/2017 Elsevier Patient Education  2020 Elsevier Inc.   

## 2024-06-16 NOTE — Progress Notes (Signed)
 US  38 wks,cephalic,left lateral placenta gr 0,marginal CI not seen on today scan,FHR 138 BPM,AFI 17 cm,EFW 3940 g 95%

## 2024-06-18 ENCOUNTER — Encounter: Payer: Self-pay | Admitting: Women's Health

## 2024-06-18 LAB — COMPREHENSIVE METABOLIC PANEL WITH GFR
ALT: 15 IU/L (ref 0–32)
AST: 22 IU/L (ref 0–40)
Albumin: 4 g/dL (ref 4.0–5.0)
Alkaline Phosphatase: 108 IU/L (ref 44–121)
BUN/Creatinine Ratio: 13 (ref 9–23)
BUN: 7 mg/dL (ref 6–20)
Bilirubin Total: 0.3 mg/dL (ref 0.0–1.2)
CO2: 17 mmol/L — ABNORMAL LOW (ref 20–29)
Calcium: 9.3 mg/dL (ref 8.7–10.2)
Chloride: 102 mmol/L (ref 96–106)
Creatinine, Ser: 0.56 mg/dL — ABNORMAL LOW (ref 0.57–1.00)
Globulin, Total: 2.7 g/dL (ref 1.5–4.5)
Glucose: 71 mg/dL (ref 70–99)
Potassium: 4.3 mmol/L (ref 3.5–5.2)
Sodium: 136 mmol/L (ref 134–144)
Total Protein: 6.7 g/dL (ref 6.0–8.5)
eGFR: 126 mL/min/1.73 (ref >59)

## 2024-06-18 LAB — BILE ACIDS, TOTAL: Bile Acids Total: 3.8 umol/L (ref 0.0–10.0)

## 2024-06-21 ENCOUNTER — Ambulatory Visit: Payer: Self-pay | Admitting: Women's Health

## 2024-06-23 ENCOUNTER — Encounter: Payer: Self-pay | Admitting: Women's Health

## 2024-06-23 ENCOUNTER — Ambulatory Visit (INDEPENDENT_AMBULATORY_CARE_PROVIDER_SITE_OTHER): Admitting: Women's Health

## 2024-06-23 VITALS — BP 134/80 | HR 91 | Wt 209.0 lb

## 2024-06-23 DIAGNOSIS — O43193 Other malformation of placenta, third trimester: Secondary | ICD-10-CM

## 2024-06-23 DIAGNOSIS — R600 Localized edema: Secondary | ICD-10-CM | POA: Diagnosis not present

## 2024-06-23 DIAGNOSIS — Z3483 Encounter for supervision of other normal pregnancy, third trimester: Secondary | ICD-10-CM

## 2024-06-23 DIAGNOSIS — Z3A39 39 weeks gestation of pregnancy: Secondary | ICD-10-CM

## 2024-06-23 DIAGNOSIS — R0989 Other specified symptoms and signs involving the circulatory and respiratory systems: Secondary | ICD-10-CM

## 2024-06-23 DIAGNOSIS — O43199 Other malformation of placenta, unspecified trimester: Secondary | ICD-10-CM

## 2024-06-23 LAB — POCT URINALYSIS DIPSTICK OB
Blood, UA: NEGATIVE
Glucose, UA: NEGATIVE
Ketones, UA: NEGATIVE
Leukocytes, UA: NEGATIVE
Nitrite, UA: NEGATIVE
POC,PROTEIN,UA: NEGATIVE

## 2024-06-23 NOTE — Patient Instructions (Signed)
Jamila, thank you for choosing our office today! We appreciate the opportunity to meet your healthcare needs. You may receive a short survey by mail, e-mail, or through MyChart. If you are happy with your care we would appreciate if you could take just a few minutes to complete the survey questions. We read all of your comments and take your feedback very seriously. Thank you again for choosing our office.  [facility]  [facility] ([address] [place], [place] [address]) Entrance C, located off of [address] Free 24/7 valet parking   CLASSES: Go to [email or web address] to register for classes (childbirth, breastfeeding, waterbirth, infant CPR, daddy bootcamp, etc.)  Call the office ([phone]) or go to [facility] if: You begin to have strong, frequent contractions Your water breaks.  Sometimes it is a big gush of fluid, sometimes it is just a trickle that keeps getting your panties wet or running down your legs You have vaginal bleeding.  It is normal to have a small amount of spotting if your cervix was checked.  You don't feel your baby moving like normal.  If you don't, get you something to eat and drink and lay down and focus on feeling your baby move.   If your baby is still not moving like normal, you should call the office or go to [facility].  Call the office ([phone]) or [facility] for these signs of pre-eclampsia: Severe headache that does not go away with Tylenol Visual changes- seeing spots, double, blurred vision Pain under your right breast or upper abdomen that does not go away with Tums or heartburn medicine Nausea and/or vomiting Severe swelling in your hands, feet, and face   [place] [facility]ians/Family Doctors [facility] ([facility]): [address], [phone]           [facility]: [address], [phone]                  [facility] ([facility]): [address], [phone] (call to ask if accepting patients) [facility]: [address], [place], [phone]    [place] Pediatricians/Family Doctors [facility] ([facility]): [address], [phone] [facility]: [address], [phone] [facility]: [address], [phone]  [place] [facility]  [facility] ([facility]): [phone] [facility]: [address], [phone]   [place] [facility]: [address], [phone]  [place] [facility]  [facility]: [address], [phone]  Home Blood Pressure Monitoring for Patients   Your provider has recommended that you check your blood pressure (BP) at least once a week at home. If you do not have a blood pressure cuff at home, one will be provided for you. Contact your provider if you have not received your monitor within 1 week.   Helpful Tips for Accurate Home Blood Pressure Checks  Don't smoke, exercise, or drink caffeine 30 minutes before checking your BP Use the restroom before checking your BP (a full bladder can raise your pressure) Relax in a comfortable upright chair Feet on the ground Left arm resting comfortably on a flat surface at the level of your heart Legs uncrossed Back supported Sit quietly and don't talk Place the cuff on your bare arm Adjust snuggly, so that only two fingertips   can fit between your skin and the top of the cuff Check 2 readings separated by at least one minute Keep a log of your BP readings For a visual, please reference this diagram: http://ccnc.care/bpdiagram  Provider Name: [facility]     Phone: [phone]  Zone 1: ALL CLEAR  Continue to monitor your symptoms:  BP reading is less than 140 (top number) or less than 90 (bottom number)  No right  upper stomach pain No headaches or seeing spots No feeling nauseated or throwing up No swelling in face and hands  Zone 2: CAUTION Call your doctor's office for any of the following:  BP reading is greater than 140 (top number) or greater than 90 (bottom number)  Stomach pain under your ribs in the middle or right side Headaches or seeing spots Feeling nauseated or throwing up Swelling in face and hands  Zone 3: EMERGENCY  Seek immediate medical care if you have any of the following:  BP reading is greater than160 (top number) or greater than 110 (bottom number) Severe headaches not improving with Tylenol Serious difficulty catching your breath Any worsening symptoms from Zone 2   Braxton Hicks Contractions Contractions of the uterus can occur throughout pregnancy, but they are not always a sign that you are in labor. You may have practice contractions called Braxton Hicks contractions. These false labor contractions are sometimes confused with true labor. What are Braxton Hicks contractions? Braxton Hicks contractions are tightening movements that occur in the muscles of the uterus before labor. Unlike true labor contractions, these contractions do not result in opening (dilation) and thinning of the cervix. Toward the end of pregnancy (32-34 weeks), Braxton Hicks contractions can happen more often and may become stronger. These contractions are sometimes difficult to tell apart from true labor because they can be very uncomfortable. You should not feel embarrassed if you go to the hospital with false labor. Sometimes, the only way to tell if you are in true labor is for your health care provider to look for changes in the cervix. The health care provider will do a physical exam and may monitor your contractions. If you are not in true labor, the exam should show that your cervix is not dilating and your water has not broken. If there are no other health problems associated with your  pregnancy, it is completely safe for you to be sent home with false labor. You may continue to have Braxton Hicks contractions until you go into true labor. How to tell the difference between true labor and false labor True labor Contractions last 30-70 seconds. Contractions become very regular. Discomfort is usually felt in the top of the uterus, and it spreads to the lower abdomen and low back. Contractions do not go away with walking. Contractions usually become more intense and increase in frequency. The cervix dilates and gets thinner. False labor Contractions are usually shorter and not as strong as true labor contractions. Contractions are usually irregular. Contractions are often felt in the front of the lower abdomen and in the groin. Contractions may go away when you walk around or change positions while lying down. Contractions get weaker and are shorter-lasting as time goes on. The cervix usually does not dilate or become thin. Follow these instructions at home:  Take over-the-counter and prescription medicines only as told by your health care provider. Keep up with your usual exercises and follow other instructions from your health care provider. Eat and drink lightly if you think   you are going into labor. If Braxton Hicks contractions are making you uncomfortable: Change your position from lying down or resting to walking, or change from walking to resting. Sit and rest in a tub of warm water. Drink enough fluid to keep your urine pale yellow. Dehydration may cause these contractions. Do slow and deep breathing several times an hour. Keep all follow-up prenatal visits as told by your health care provider. This is important. Contact a health care provider if: You have a fever. You have continuous pain in your abdomen. Get help right away if: Your contractions become stronger, more regular, and closer together. You have fluid leaking or gushing from your vagina. You pass  blood-tinged mucus (bloody show). You have bleeding from your vagina. You have low back pain that you never had before. You feel your baby's head pushing down and causing pelvic pressure. Your baby is not moving inside you as much as it used to. Summary Contractions that occur before labor are called Braxton Hicks contractions, false labor, or practice contractions. Braxton Hicks contractions are usually shorter, weaker, farther apart, and less regular than true labor contractions. True labor contractions usually become progressively stronger and regular, and they become more frequent. Manage discomfort from Braxton Hicks contractions by changing position, resting in a warm bath, drinking plenty of water, or practicing deep breathing. This information is not intended to replace advice given to you by your health care provider. Make sure you discuss any questions you have with your health care provider. Document Revised: 10/31/2017 Document Reviewed: 04/03/2017 Elsevier Patient Education  2020 Elsevier Inc.   

## 2024-06-23 NOTE — Progress Notes (Signed)
 LOW-RISK PREGNANCY VISIT Patient name: Aimee Burns MRN [id number]  Date of birth: 1991/04/12 Chief Complaint:   Routine Prenatal Visit  History of Present Illness:   Aimee Burns is a 33 y.o. [phone] female at [redacted]w[redacted]d with an Estimated Date of Delivery: 06/30/24 being seen today for ongoing management of a low-risk pregnancy.   Today she reports occ contractions, Rt ear pain, bilateral leg swelling, Lt calf pain when sitting down x 1wk- concerned about DVT, losing some mucous. BP normal at work, denies severe ha, visual changes, ruq/epigastric pain.   Contractions: Not present. Vag. Bleeding: None.  Movement: Present. denies leaking of fluid.     04/06/2024    9:30 AM 12/16/2023    1:49 PM 10/13/2020   10:05 AM  Depression screen PHQ 2/9  Decreased Interest 0 0 0  Down, Depressed, Hopeless 0 0 0  PHQ - 2 Score 0 0 0  Altered sleeping 0 0 0  Tired, decreased energy 0 0 0  Change in appetite 0 0 0  Feeling bad or failure about yourself  0 0 0  Trouble concentrating 0 0 0  Moving slowly or fidgety/restless 0 0 0  Suicidal thoughts 0 0 0  PHQ-9 Score 0 0 0        04/06/2024    9:30 AM 12/16/2023    1:49 PM 10/13/2020   10:06 AM  GAD 7 : Generalized Anxiety Score  Nervous, Anxious, on Edge 0 0 0  Control/stop worrying 0 0 0  Worry too much - different things 0 0 0  Trouble relaxing 0 0 0  Restless 0 0 0  Easily annoyed or irritable 0 0 0  Afraid - awful might happen 0 0 0  Total GAD 7 Score 0 0 0      Review of Systems:   Pertinent items are noted in HPI Denies abnormal vaginal discharge w/ itching/odor/irritation, headaches, visual changes, shortness of breath, chest pain, abdominal pain, severe nausea/vomiting, or problems with urination or bowel movements unless otherwise stated above. Pertinent History Reviewed:  Reviewed past medical,surgical, social, obstetrical and family history.  Reviewed problem list, medications and allergies. Physical  Assessment:   Vitals:   06/23/24 1456 06/23/24 1503  BP: (!) 142/79 134/80  Pulse: 91   Weight: 209 lb (94.8 kg)   Body mass index is 31.78 kg/m.        Physical Examination:   General appearance: Well appearing, and in no distress  Mental status: Alert, oriented to person, place, and time  Skin: Warm & dry  Ears: bilateral TM wnl, no evidence of infection  Cardiovascular: Normal heart rate noted  Respiratory: Normal respiratory effort, no distress  Abdomen: Soft, gravid, nontender  Pelvic: Cervical exam performed  Dilation: 4 Effacement (%): 50 Station: -2, Offered membrane sweeping, discussed r/b- pt decided to proceed, so membranes swept.   Extremities: Edema: Mild pitting, slight indentation, bilateral LE edema, multiple varicose veins, no obvious chords/edema/erythema Lt calf, mild +Homans  Fetal Status: Fetal Heart Rate (bpm): 160 Fundal Height: 38 cm Movement: Present Presentation: Vertex  Chaperone: Alan Fischer Results for orders placed or performed in visit on 06/23/24 (from the past 24 hours)  POC Urinalysis Dipstick OB   Collection Time: 06/23/24  3:32 PM  Result Value Ref Range   Color, UA     Clarity, UA     Glucose, UA Negative Negative   Bilirubin, UA     Ketones, UA neg    Spec Grav,  UA     Blood, UA neg    pH, UA     POC,PROTEIN,UA Negative Negative, Trace, Small (1+), Moderate (2+), Large (3+), 4+   Urobilinogen, UA     Nitrite, UA neg    Leukocytes, UA Negative Negative   Appearance     Odor      Assessment & Plan:  1) Low-risk pregnancy [id number] at [redacted]w[redacted]d with an Estimated Date of Delivery: 06/30/24   2) Marginal cord insertion, EFW 95% @ 38w  3) Suspected LGA> 95% @ 38w, proven to 3805g/no difficulty, declines eIOL until 7/27  4) 1st bp elevated> rpt wnl, have been normal at work, asymptomatic, no proteinuria. To return tomorrow am for bp check w/ nurse  5) Bilateral LE edema, Lt calf pain when sitting x 1wk> mildly +Homan's, multiple varicose  veins, no obvious evidence of DVT, pt concerned- will get u/s, order placed and note routed to [facility] to call [facility] to see when she can come   Meds: No orders of the defined types were placed in this encounter.  Labs/procedures today: SVE and membrane sweep  Plan:  Continue routine obstetrical care  Next visit: prefers in person    Reviewed: Term labor symptoms and general obstetric precautions including but not limited to vaginal bleeding, contractions, leaking of fluid and fetal movement were reviewed in detail with the patient.  All questions were answered. Does have home bp cuff. Office bp cuff given: not applicable. Check bp daily, let us  know if consistently >140 and/or >90.  Follow-up: Return for tomorrow am for nurse bp check.  Future Appointments  Date Time Provider Department Center  06/24/2024  8:50 AM [facility] NURSE [facility]  06/30/2024  3:30 PM Delores Nidia CROME, FNP [facility]    Orders Placed This Encounter  Procedures   US  Venous Img Lower Unilateral Left (DVT)   POC Urinalysis Dipstick OB   Suzen JONELLE Fetters CNM, [facility] 06/23/2024 3:58 PM

## 2024-06-24 ENCOUNTER — Ambulatory Visit (INDEPENDENT_AMBULATORY_CARE_PROVIDER_SITE_OTHER)

## 2024-06-24 VITALS — BP 125/72

## 2024-06-24 DIAGNOSIS — Z3483 Encounter for supervision of other normal pregnancy, third trimester: Secondary | ICD-10-CM | POA: Diagnosis not present

## 2024-06-24 DIAGNOSIS — Z3A39 39 weeks gestation of pregnancy: Secondary | ICD-10-CM

## 2024-06-24 NOTE — Progress Notes (Addendum)
   NURSE VISIT- BLOOD PRESSURE CHECK  SUBJECTIVE:  Aimee Burns is a 33 y.o. [phone] female here for BP check. She is [redacted]w[redacted]d pregnant   Slight decreased fm this morning HYPERTENSION ROS:  Pregnant/postpartum:  Severe headaches that don't go away with tylenol /other medicines: No  Visual changes (seeing spots/double/blurred vision) No  Severe pain under right breast breast or in center of upper chest No  Severe nausea/vomiting No  Taking medicines as instructed not applicable    OBJECTIVE:  BP 125/72 (BP Location: Left Arm, Patient Position: Sitting, Cuff Size: Normal)   LMP 09/24/2023 (Exact Date)   Appearance alert, well appearing, and in no distress. Placed patient on EFM for NST. Luke Fetters to review.   ASSESSMENT: Pregnancy [redacted]w[redacted]d  blood pressure check  PLAN: Discussed with Luke Fetters, CNM, [facility]   Recommendations: no changes needed   Follow-up: as scheduled   Aleck FORBES Blase  06/24/2024 9:16 AM   Chart reviewed for nurse visit. Agree with plan of care. Pt w/ good fm. Reactive NST.  NST: FHR baseline 145 bpm, Variability: moderate, Accelerations:present, Decelerations:  Absent= Cat 1/reactive Toco: none  SVE per request 4/50/-2, vtx, no change from yesterday.  No more Lt calf pain, will cancel DVT u/s.  BP normal, asymptomatic Wants eIOL 7/27- called and put on list, then PD IOL 8/6 if needed. Fetters Suzen SAUNDERS, [place] 06/24/2024 11:41 AM

## 2024-06-28 ENCOUNTER — Encounter (HOSPITAL_COMMUNITY): Payer: Self-pay | Admitting: Family Medicine

## 2024-06-28 ENCOUNTER — Inpatient Hospital Stay (HOSPITAL_COMMUNITY)
Admission: AD | Admit: 2024-06-28 | Discharge: 2024-06-30 | DRG: 798 | Disposition: A | Discharge status: Home / Self Care | Attending: Family Medicine | Admitting: Family Medicine

## 2024-06-28 ENCOUNTER — Inpatient Hospital Stay (HOSPITAL_COMMUNITY)
Admission: AD | Admit: 2024-06-28 | Discharge: 2024-06-28 | Disposition: A | Discharge status: Home / Self Care | Source: Home / Self Care | Attending: Family Medicine | Admitting: Family Medicine

## 2024-06-28 ENCOUNTER — Encounter (HOSPITAL_COMMUNITY): Payer: Self-pay | Admitting: Anesthesiology

## 2024-06-28 DIAGNOSIS — O9902 Anemia complicating childbirth: Secondary | ICD-10-CM | POA: Diagnosis present

## 2024-06-28 DIAGNOSIS — O43193 Other malformation of placenta, third trimester: Secondary | ICD-10-CM | POA: Diagnosis present

## 2024-06-28 DIAGNOSIS — O479 False labor, unspecified: Secondary | ICD-10-CM

## 2024-06-28 DIAGNOSIS — Z3A39 39 weeks gestation of pregnancy: Secondary | ICD-10-CM

## 2024-06-28 DIAGNOSIS — Z833 Family history of diabetes mellitus: Secondary | ICD-10-CM | POA: Diagnosis not present

## 2024-06-28 DIAGNOSIS — O471 False labor at or after 37 completed weeks of gestation: Secondary | ICD-10-CM | POA: Insufficient documentation

## 2024-06-28 DIAGNOSIS — O4423 Partial placenta previa NOS or without hemorrhage, third trimester: Secondary | ICD-10-CM | POA: Diagnosis not present

## 2024-06-28 DIAGNOSIS — O99824 Streptococcus B carrier state complicating childbirth: Secondary | ICD-10-CM | POA: Diagnosis present

## 2024-06-28 DIAGNOSIS — O26899 Other specified pregnancy related conditions, unspecified trimester: Secondary | ICD-10-CM

## 2024-06-28 DIAGNOSIS — O99820 Streptococcus B carrier state complicating pregnancy: Secondary | ICD-10-CM | POA: Diagnosis not present

## 2024-06-28 DIAGNOSIS — M79605 Pain in left leg: Secondary | ICD-10-CM | POA: Insufficient documentation

## 2024-06-28 DIAGNOSIS — O26893 Other specified pregnancy related conditions, third trimester: Principal | ICD-10-CM | POA: Diagnosis present

## 2024-06-28 DIAGNOSIS — Z302 Encounter for sterilization: Secondary | ICD-10-CM

## 2024-06-28 DIAGNOSIS — O4202 Full-term premature rupture of membranes, onset of labor within 24 hours of rupture: Secondary | ICD-10-CM | POA: Diagnosis not present

## 2024-06-28 DIAGNOSIS — Z7982 Long term (current) use of aspirin: Secondary | ICD-10-CM

## 2024-06-28 DIAGNOSIS — Z6791 Unspecified blood type, Rh negative: Secondary | ICD-10-CM | POA: Diagnosis not present

## 2024-06-28 DIAGNOSIS — Z9851 Tubal ligation status: Secondary | ICD-10-CM

## 2024-06-28 LAB — TYPE AND SCREEN
ABO/RH(D): A NEG
Antibody Screen: POSITIVE

## 2024-06-28 LAB — CBC
HCT: 31.3 % — ABNORMAL LOW (ref 36.0–46.0)
Hemoglobin: 10.3 g/dL — ABNORMAL LOW (ref 12.0–15.0)
MCH: 30 pg (ref 26.0–34.0)
MCHC: 32.9 g/dL (ref 30.0–36.0)
MCV: 91.3 fL (ref 80.0–100.0)
Platelets: 244 K/uL (ref 150–400)
RBC: 3.43 MIL/uL — ABNORMAL LOW (ref 3.87–5.11)
RDW: 14.1 % (ref 11.5–15.5)
WBC: 13.9 K/uL — ABNORMAL HIGH (ref 4.0–10.5)
nRBC: 0 % (ref 0.0–0.2)

## 2024-06-28 MED ORDER — OXYCODONE-ACETAMINOPHEN 5-325 MG PO TABS: 2.0000 | ORAL_TABLET | ORAL | Status: DC | PRN

## 2024-06-28 MED ORDER — PHENYLEPHRINE 80 MCG/ML (10ML) SYRINGE FOR IV PUSH (FOR BLOOD PRESSURE SUPPORT): 80.0000 ug | PREFILLED_SYRINGE | INTRAVENOUS | Status: DC | PRN

## 2024-06-28 MED ORDER — TRANEXAMIC ACID-NACL 1000-0.7 MG/100ML-% IV SOLN
INTRAVENOUS | Status: AC
  Filled 2024-06-28: qty 100

## 2024-06-28 MED ORDER — DIPHENHYDRAMINE HCL 50 MG/ML IJ SOLN: 12.5000 mg | INTRAMUSCULAR | Status: DC | PRN

## 2024-06-28 MED ORDER — SOD CITRATE-CITRIC ACID 500-334 MG/5ML PO SOLN: 30.0000 mL | ORAL | Status: DC | PRN

## 2024-06-28 MED ORDER — LACTATED RINGERS IV SOLN: INTRAVENOUS | Status: DC

## 2024-06-28 MED ORDER — TRANEXAMIC ACID-NACL 1000-0.7 MG/100ML-% IV SOLN
1000.0000 mg | Freq: Once | INTRAVENOUS | Status: AC
  Administered 2024-06-28: 1000 mg via INTRAVENOUS

## 2024-06-28 MED ORDER — FENTANYL CITRATE (PF) 100 MCG/2ML IJ SOLN
50.0000 ug | INTRAMUSCULAR | Status: DC | PRN
  Administered 2024-06-28: 100 ug via INTRAVENOUS
  Filled 2024-06-28: qty 2

## 2024-06-28 MED ORDER — EPHEDRINE 5 MG/ML INJ: 10.0000 mg | INTRAVENOUS | Status: DC | PRN

## 2024-06-28 MED ORDER — ACETAMINOPHEN 325 MG PO TABS: 650.0000 mg | ORAL_TABLET | ORAL | Status: DC | PRN

## 2024-06-28 MED ORDER — FENTANYL-BUPIVACAINE-NACL 0.5-0.125-0.9 MG/250ML-% EP SOLN
12.0000 mL/h | EPIDURAL | Status: DC | PRN
  Filled 2024-06-28: qty 250

## 2024-06-28 MED ORDER — OXYCODONE-ACETAMINOPHEN 5-325 MG PO TABS: 1.0000 | ORAL_TABLET | ORAL | Status: DC | PRN

## 2024-06-28 MED ORDER — SODIUM CHLORIDE 0.9 % IV SOLN
5.0000 10*6.[IU] | Freq: Once | INTRAVENOUS | Status: AC
  Administered 2024-06-28: 5 10*6.[IU] via INTRAVENOUS
  Filled 2024-06-28: qty 5

## 2024-06-28 MED ORDER — LIDOCAINE HCL (PF) 1 % IJ SOLN: 30.0000 mL | INTRAMUSCULAR | Status: DC | PRN

## 2024-06-28 MED ORDER — OXYTOCIN-SODIUM CHLORIDE 30-0.9 UT/500ML-% IV SOLN
2.5000 [IU]/h | INTRAVENOUS | Status: DC
  Filled 2024-06-28: qty 500

## 2024-06-28 MED ORDER — LACTATED RINGERS IV SOLN
500.0000 mL | INTRAVENOUS | Status: DC | PRN
  Administered 2024-06-28: 1000 mL via INTRAVENOUS

## 2024-06-28 MED ORDER — PENICILLIN G POT IN DEXTROSE 60000 UNIT/ML IV SOLN: 3.0000 10*6.[IU] | INTRAVENOUS | Status: DC

## 2024-06-28 MED ORDER — OXYTOCIN BOLUS FROM INFUSION
333.0000 mL | Freq: Once | INTRAVENOUS | Status: AC
  Administered 2024-06-28: 333 mL via INTRAVENOUS

## 2024-06-28 MED ORDER — ONDANSETRON HCL 4 MG/2ML IJ SOLN: 4.0000 mg | Freq: Four times a day (QID) | INTRAMUSCULAR | Status: DC | PRN

## 2024-06-28 MED ORDER — LACTATED RINGERS IV SOLN: 500.0000 mL | Freq: Once | INTRAVENOUS | Status: DC

## 2024-06-28 NOTE — MAU Provider Note (Signed)
 None      S: Ms. Lorijean Husser is a 33 y.o. [phone] at [redacted]w[redacted]d  who presents to MAU today complaining contractions q 5-7 minutes since 0300. She denies vaginal bleeding. She denies LOF. She reports normal fetal movement.    O: BP 119/72   Pulse 97   Temp 98.2 F (36.8 C) (Oral)   Resp 18   Ht 5' 7 (1.702 m)   Wt 94 kg   LMP 09/24/2023 (Exact Date)   SpO2 100%   BMI 32.45 kg/m  RN labor check  Cervical exam:  Dilation: 4.5 Effacement (%): 50 Station: -3 Exam by:: Nat Mule, RN   Fetal Monitoring: Baseline: 140 Variability: mod Accelerations: present Decelerations: absent Contractions: every 5-7 min   A/P: SIUP at [redacted]w[redacted]d here with contractions at term Cervix unchanged over 1 hour period   Von Reasoner, MD 06/28/2024 8:46 AM

## 2024-06-28 NOTE — Anesthesia Preprocedure Evaluation (Addendum)
 Anesthesia Evaluation  Patient identified by MRN, date of birth, ID band Patient awake    Reviewed: Allergy & Precautions, Patient's Chart, lab work & pertinent test results  Airway Mallampati: II  TM Distance: >3 FB     Dental no notable dental hx.    Pulmonary neg pulmonary ROS   Pulmonary exam normal        Cardiovascular negative cardio ROS Normal cardiovascular exam Rhythm:Regular     Neuro/Psych  PSYCHIATRIC DISORDERS Anxiety     negative neurological ROS     GI/Hepatic Neg liver ROS,GERD  ,,  Endo/Other  Obesity  Renal/GU Renal diseaseHx/o renal calculi  negative genitourinary   Musculoskeletal negative musculoskeletal ROS (+)    Abdominal  (+) + obese  Peds  Hematology  (+) Blood dyscrasia, anemia   Anesthesia Other Findings   Reproductive/Obstetrics (+) Pregnancy                              Anesthesia Physical Anesthesia Plan  ASA: 2  Anesthesia Plan: Epidural   Post-op Pain Management: Minimal or no pain anticipated   Induction:   PONV Risk Score and Plan: Treatment may vary due to age or medical condition  Airway Management Planned: Natural Airway  Additional Equipment: Fetal Monitoring and None  Intra-op Plan:   Post-operative Plan:   Informed Consent: I have reviewed the patients History and Physical, chart, labs and discussed the procedure including the risks, benefits and alternatives for the proposed anesthesia with the patient or authorized representative who has indicated his/her understanding and acceptance.       Plan Discussed with: Anesthesiologist  Anesthesia Plan Comments: (Patient fully dilated. Epidural not performed.)         Anesthesia Quick Evaluation

## 2024-06-28 NOTE — Discharge Summary (Signed)
 Postpartum Discharge Summary    Patient Name: Aimee Burns DOB: September 30, 1991 MRN: [id number]  Date of admission: 06/28/2024 Delivery date:06/28/2024 Delivering provider: CHUBB, CASEY C Date of discharge: 07/13/2024  Admitting diagnosis: Indication for care in labor or delivery [O75.9] Intrauterine pregnancy: [redacted]w[redacted]d     Secondary diagnosis:  Active Problems:   Rh negative state in antepartum period   Indication for care in labor or delivery   History of bilateral tubal ligation  Additional problems:     Discharge diagnosis: Term Pregnancy Delivered                                              Post partum procedures:postpartum tubal ligation Augmentation: N/A Complications: None  Hospital course: Onset of Labor With Vaginal Delivery      33 y.o. yo [id number] at [redacted]w[redacted]d was admitted in Active Labor on 06/28/2024. Labor course was uncomplicated.  Membrane Rupture Time/Date: 8:38 PM,06/28/2024  Delivery Method:Vaginal, Spontaneous Operative Delivery:N/A Episiotomy: None Lacerations:  None Patient had a postpartum course complicated by n/a.  She is ambulating, tolerating a regular diet, passing flatus, and urinating well. Patient is discharged home in stable condition on 07/13/24.  Newborn Data: Birth date:06/28/2024 Birth time:10:05 PM Gender:Female Living status:Living Apgars:8 ,9  Weight:3.969 kg  Magnesium Sulfate received: No BMZ received: No Rhophylac :No, infant Rh neg MMR:N/A T-DaP:Given prenatally Flu: No RSV Vaccine received: No Transfusion:No  Immunizations received: Immunization History  Administered Date(s) Administered   HPV Quadrivalent 04/24/2010, 08/24/2012, 04/25/2015   Rho (D) Immune Globulin  04/06/2013, 02/21/2021, 01/28/2024, 04/20/2024   Tdap 03/25/2011, 05/18/2013, 12/26/2016, 02/21/2021, 04/06/2024    Physical exam  Vitals:   06/29/24 1510 06/29/24 1938 06/29/24 2318 06/30/24 0438  BP: 119/67 (!) 113/58 (!) 107/55 (!) 110/57  Pulse: 67 72 77  72  Resp: 20 20 18    Temp: 98.2 F (36.8 C) 98.6 F (37 C) 98.1 F (36.7 C) 98.1 F (36.7 C)  TempSrc: Axillary Oral Oral Oral  SpO2:  98% 98% 98%  Weight:      Height:       General: alert, cooperative, and no distress Lochia: appropriate Uterine Fundus: firm Incision: Dressing is clean, dry, and intact DVT Evaluation: No evidence of DVT seen on physical exam. No significant calf/ankle edema. Labs: Lab Results  Component Value Date   WBC 18.9 (H) 06/29/2024   HGB 9.2 (L) 06/29/2024   HCT 27.0 (L) 06/29/2024   MCV 90.9 06/29/2024   PLT 242 06/29/2024      Latest Ref Rng & Units 06/16/2024    9:57 AM  CMP  Glucose 70 - 99 mg/dL 71   BUN 6 - 20 mg/dL 7   Creatinine 9.42 - 8.99 mg/dL 9.43   Sodium 865 - 855 mmol/L 136   Potassium 3.5 - 5.2 mmol/L 4.3   Chloride 96 - 106 mmol/L 102   CO2 20 - 29 mmol/L 17   Calcium 8.7 - 10.2 mg/dL 9.3   Total Protein 6.0 - 8.5 g/dL 6.7   Total Bilirubin 0.0 - 1.2 mg/dL 0.3   Alkaline Phos 44 - 121 IU/L 108   AST 0 - 40 IU/L 22   ALT 0 - 32 IU/L 15    Edinburgh Score:    07/07/2024   12:11 PM  Edinburgh Postnatal Depression Scale Screening Tool  I have been able to laugh and see the funny side  of things. 2  I have looked forward with enjoyment to things. 1  I have blamed myself unnecessarily when things went wrong. 3  I have been anxious or worried for no good reason. 3  I have felt scared or panicky for no good reason. 3  Things have been getting on top of me. 2  I have been so unhappy that I have had difficulty sleeping. 1  I have felt sad or miserable. 2  I have been so unhappy that I have been crying. 1  The thought of harming myself has occurred to me. 0  Edinburgh Postnatal Depression Scale Total 18   No data recorded  After visit meds:  Allergies as of 06/30/2024   No Known Allergies      Medication List     STOP taking these medications    aspirin EC 81 MG tablet       TAKE these medications     acetaminophen  325 MG tablet Commonly known as: Tylenol  Take 2 tablets (650 mg total) by mouth every 4 (four) hours as needed (for pain scale < 4).   ferrous sulfate  325 (65 FE) MG tablet Take 1 tablet (325 mg total) by mouth every other day.   ibuprofen  600 MG tablet Commonly known as: ADVIL  Take 1 tablet (600 mg total) by mouth every 6 (six) hours.         Discharge home in stable condition Infant Feeding: Bottle Infant Disposition:home with mother Discharge instruction: per After Visit Summary and Postpartum booklet. Activity: Advance as tolerated. Pelvic rest for 6 weeks.  Diet: routine diet Future Appointments: Future Appointments  Date Time Provider Department Center  08/11/2024 10:30 AM Kizzie Suzen SAUNDERS, CNM [facility]   Follow up Visit: Message to [facility] 7/28  Please schedule this patient for a In person postpartum visit in 4 weeks with the following provider: Any provider. Additional Postpartum F/U:n/a  Low risk pregnancy complicated by: n/a Delivery mode:  Vaginal, Spontaneous Anticipated Birth Control:  tubal    07/13/2024 Donnice CHRISTELLA Carolus, MD

## 2024-06-28 NOTE — H&P (Cosign Needed Addendum)
 OBSTETRIC ADMISSION HISTORY AND PHYSICAL  Aimee Burns is a 33 y.o. female [phone] with IUP at [redacted]w[redacted]d by LMP presenting for spontaneous onset of labor. She reports +FMs, No LOF, no VB, no blurry vision, headaches or peripheral edema, and RUQ pain.  She plans on breast feeding. She request tubal ligation for birth control. She received her prenatal care at [facility]   Dating: By LMP US  --->  Estimated Date of Delivery: 06/30/24  Sono:    @[redacted]w[redacted]d , CWD, normal anatomy, cephalic presentation, 3940 g, 95% EFW    Prenatal History/Complications:  Marginal code insertion    Past Medical History: Past Medical History:  Diagnosis Date   Pregnancy    Renal calculi    2011,right parathyroidectomy   Thyroid tumor, benign    Vaginal Pap smear, abnormal     Past Surgical History: Past Surgical History:  Procedure Laterality Date   Parathyroid  gland removed     PARATHYROIDECTOMY / EXPLORATION OF PARATHYROIDS Right 2011   [place], at age18   THYROIDECTOMY, PARTIAL Right 2011    Obstetrical History: OB History     Gravida  4   Para  3   Term  3   Preterm      AB      Living  3      SAB      IAB      Ectopic      Multiple  0   Live Births  3           Social History Social History   Socioeconomic History   Marital status: Married    Spouse name: Not on file   Number of children: Not on file   Years of education: Not on file   Highest education level: Not on file  Occupational History   Not on file  Tobacco Use   Smoking status: Never   Smokeless tobacco: Never  Vaping Use   Vaping status: Never Used  Substance and Sexual Activity   Alcohol use: Not Currently    Comment: occassionaly   Drug use: No   Sexual activity: Yes    Birth control/protection: None  Other Topics Concern   Not on file  Social History Narrative   Not on file   Social Drivers of Health   Financial Resource Strain: Low Risk  (12/16/2023)   Overall Financial  Resource Strain (CARDIA)    Difficulty of Paying Living Expenses: Not hard at all  Food Insecurity: No Food Insecurity (06/28/2024)   Hunger Vital Sign    Worried About Running Out of Food in the Last Year: Never true    Ran Out of Food in the Last Year: Never true  Transportation Needs: No Transportation Needs (06/28/2024)   PRAPARE - Administrator, Civil Service (Medical): No    Lack of Transportation (Non-Medical): No  Physical Activity: Sufficiently Active (12/16/2023)   Exercise Vital Sign    Days of Exercise per Week: 5 days    Minutes of Exercise per Session: 30 min  Stress: No Stress Concern Present (12/16/2023)   [employer] of Occupational Health - Occupational Stress Questionnaire    Feeling of Stress : Not at all  Social Connections: Moderately Isolated (12/16/2023)   Social Connection and Isolation Panel    Frequency of Communication with Friends and Family: More than three times a week    Frequency of Social Gatherings with Friends and Family: Once a week    Attends Religious Services: Never  Active Member of Clubs or Organizations: No    Attends Banker Meetings: Never    Marital Status: Married    Family History: Family History  Problem Relation Age of Onset   Diabetes Paternal Grandmother    Cancer Paternal Grandmother        lung   Thyroid disease Mother     Allergies: No Known Allergies  Medications Prior to Admission  Medication Sig Dispense Refill Last Dose/Taking   aspirin EC 81 MG tablet Take 81 mg by mouth daily. Swallow whole.      pantoprazole  (PROTONIX ) 20 MG tablet Take 1 tablet (20 mg total) by mouth daily. 30 tablet 2    Prenatal Vit-Fe Fumarate-FA (PRENATAL VITAMIN PO) Take by mouth.        Review of Systems   All systems reviewed and negative except as stated in HPI  Blood pressure 135/73, pulse 95, temperature 98.5 F (36.9 C), temperature source Oral, resp. rate 18, height 5' 9 (1.753 m), weight 95.4  kg, last menstrual period 09/24/2023, SpO2 100%. General appearance: alert and cooperative Lungs: clear to auscultation bilaterally Heart: regular rate and rhythm Abdomen: soft, non-tender; bowel sounds normal Pelvic: not accessed  Extremities: Homans sign is negative, no sign of DVT DTR's not evaluated  Presentation: cephalic Fetal monitoringBaseline: 145 bpm, Variability: Good {> 6 bpm), Accelerations: Reactive, and Decelerations: Absent Uterine activity: 5-7 (min)  Dilation: 6.5 Effacement (%): 80 Station: -1 Exam by:: Maurilio Hover, RN   Prenatal labs: ABO, Rh: A/Negative/-- (01/14 1504) Antibody: Positive, See Final Results (05/06 0818) Rubella: 6.74 (01/14 1504) RPR: Non Reactive (05/06 0818)  HBsAg: Negative (01/14 1504)  HIV: Non Reactive (05/06 0818)  GBS: Positive/-- (07/09 1530)    Lab Results  Component Value Date   GBS Positive (A) 06/09/2024   GTT : WNL  Genetic screening  : Low risk female Anatomy US  : WNL   Immunization History  Administered Date(s) Administered   HPV Quadrivalent 04/24/2010, 08/24/2012, 04/25/2015   Rho (D) Immune Globulin  04/06/2013, 02/21/2021, 01/28/2024, 04/20/2024   Tdap 03/25/2011, 05/18/2013, 12/26/2016, 02/21/2021, 04/06/2024          NURSING  PROVIDER  Office Location [facility] Dating by LMP c/w U/S at 8 wks  [facility] Model Traditional Anatomy U/S Normal female 'Paxton', marginal insertion  Initiated care at  [employer]  English               LAB RESULTS   Support Person   Genetics NIPS: LR female AFP:       NT/IT (FT only) Normal NT, not doing 2nd IT (lost insurance)      Carrier Screen Horizon: neg  Rhogam  04/20/24 A1C/GTT Early HgbA1C: 5.2 Third trimester 2 hr GTT:   Flu Vaccine        TDaP Vaccine  04/06/24 Blood Type A/Negative/-- (01/14 1504)  RSV Vaccine   Antibody Negative (01/14 1504)  COVID Vaccine   Rubella 6.74 (01/14 1504)  Feeding Plan bottle RPR Non Reactive (01/14 1504)  Contraception BTL HBsAg  Negative (01/14 1504)  Circumcision yes HIV Non Reactive (01/14 1504)  Pediatrician  [facility] HCVAb Non Reactive (01/14 1504)  Prenatal Classes discussed      BTL Consent 05/04/24 Pap 06/30/23 [facility] PCP  BTL Pre-payment   GC/CT Initial:  -/- 36wks: -/- (6/16)   VBAC Consent N/a GBS  POS For PCN allergy, check  sensitivities   BRx Optimized? [ ]  yes   [ ]  no      DME Rx [x ] BP cuff [ ]  Weight Scale Waterbirth  [ ]  Class [ ]  Consent [ ]  CNM visit  PHQ9 & GAD7 [ x ] new OB [  ] 28 weeks  [  ] 36 weeks Induction  [ ]  Orders Entered [ ] Foley Y/N    Prenatal Transfer Tool  Maternal Diabetes: No Genetic Screening: Normal Maternal Ultrasounds/Referrals: Normal Fetal Ultrasounds or other Referrals:  None Maternal Substance Abuse:  No Significant Maternal Medications:  None Significant Maternal Lab Results: Group B Strep positive Number of Prenatal Visits:greater than 3 verified prenatal visits Maternal Vaccinations:TDap Other Comments:  None   Results for orders placed or performed during the hospital encounter of 06/28/24 (from the past 24 hours)  CBC   Collection Time: 06/28/24  8:13 PM  Result Value Ref Range   WBC 13.9 (H) 4.0 - 10.5 K/uL   RBC 3.43 (L) 3.87 - 5.11 MIL/uL   Hemoglobin 10.3 (L) 12.0 - 15.0 g/dL   HCT 68.6 (L) 63.9 - 53.9 %   MCV 91.3 80.0 - 100.0 fL   MCH 30.0 26.0 - 34.0 pg   MCHC 32.9 30.0 - 36.0 g/dL   RDW 85.8 88.4 - 84.4 %   Platelets 244 150 - 400 K/uL   nRBC 0.0 0.0 - 0.2 %    Patient Active Problem List   Diagnosis Date Noted   Indication for care in labor or delivery 06/28/2024   Positive testing for group B Streptococcus 06/13/2024   Marginal insertion of umbilical cord affecting management of mother 06/09/2024   Encounter for supervision of normal pregnancy, antepartum 12/12/2023   Rh negative state in antepartum period 10/11/2020   Panic disorder 04/16/2018   History of prior pregnancy with IUGR newborn 08/07/2016   Renal calculi  02/11/2013   Thyroid tumor, benign 02/11/2013    Assessment/Plan:  Pricila Bridge is a 33 y.o. G4P3003 at [redacted]w[redacted]d here for Spontaneous Onset of Labor.   #Labor:Active labor 80/80/-2, expectant management #Pain: Requests Epidural for pain  #FWB: Category 1  #GBS status:  Positive, PCN  #Feeding: Breastmilk  #Reproductive Life planning: Tubal Ligation #Circ:  Unsure  #Rh neg: PP Rhogam  #Marginal Cord Insertion: caution at delivery  Belynda JINNY Norvell Jacques, Medical Student  06/28/2024, 8:45 PM   Evaluation and management procedures were performed by the MS3 under my supervision. I was immediately available for direct supervision, assistance and direction throughout this encounter.  I also confirm that I have verified the information documented in the student's note, and that I have also personally reperformed the pertinent components of the physical exam and all of the medical decision making activities.  I have also made any necessary editorial changes.   Augustin Slade, MD Family Medicine - Obstetrics Fellow

## 2024-06-28 NOTE — MAU Note (Signed)
 Aimee Burns is a 33 y.o. at [redacted]w[redacted]d here in MAU reporting: ctx since 1600. Was here earlier for labor eval and sent home. Denies VB or LOF. +FM  LMP: NA Onset of complaint: 1600 Pain score: 8 Vitals:   06/28/24 1938  BP: 135/73  Pulse: 95  Resp: 18  Temp: 98.5 F (36.9 C)  SpO2: 100%     FHT: 145  Lab orders placed from triage: LABOR EVAL

## 2024-06-28 NOTE — MAU Note (Signed)
 Aimee Burns is a 33 y.o. at [redacted]w[redacted]d here in MAU reporting: started contracting at 0300, now radiating into pelvis.  Denies leaking or bleeding. Reports +FM.  +GBS. Is scheduled for US  of left leg on 8/29 (varicose veins and pain).  Onset of complaint: 0300 Pain score: moderate There were no vitals filed for this visit.   QYU:hntw on, reports +FM Lab orders placed from triage:

## 2024-06-29 ENCOUNTER — Ambulatory Visit (HOSPITAL_COMMUNITY)

## 2024-06-29 ENCOUNTER — Encounter: Payer: Self-pay | Admitting: Women's Health

## 2024-06-29 ENCOUNTER — Encounter (HOSPITAL_COMMUNITY)
Admission: AD | Disposition: A | Discharge status: Home / Self Care | Payer: Self-pay | Source: Home / Self Care | Attending: Family Medicine

## 2024-06-29 ENCOUNTER — Inpatient Hospital Stay (HOSPITAL_COMMUNITY): Admitting: Anesthesiology

## 2024-06-29 ENCOUNTER — Encounter (HOSPITAL_COMMUNITY): Payer: Self-pay | Admitting: Family Medicine

## 2024-06-29 DIAGNOSIS — Z302 Encounter for sterilization: Secondary | ICD-10-CM

## 2024-06-29 HISTORY — PX: TUBAL LIGATION: SHX77

## 2024-06-29 LAB — CBC
HCT: 27 % — ABNORMAL LOW (ref 36.0–46.0)
Hemoglobin: 9.2 g/dL — ABNORMAL LOW (ref 12.0–15.0)
MCH: 31 pg (ref 26.0–34.0)
MCHC: 34.1 g/dL (ref 30.0–36.0)
MCV: 90.9 fL (ref 80.0–100.0)
Platelets: 242 K/uL (ref 150–400)
RBC: 2.97 MIL/uL — ABNORMAL LOW (ref 3.87–5.11)
RDW: 14.1 % (ref 11.5–15.5)
WBC: 18.9 K/uL — ABNORMAL HIGH (ref 4.0–10.5)
nRBC: 0 % (ref 0.0–0.2)

## 2024-06-29 LAB — RPR: RPR Ser Ql: NONREACTIVE

## 2024-06-29 SURGERY — LIGATION, FALLOPIAN TUBE, POSTPARTUM
Anesthesia: General | Site: Abdomen | Laterality: Bilateral

## 2024-06-29 MED ORDER — OXYCODONE HCL 5 MG PO TABS: 5.0000 mg | ORAL_TABLET | Freq: Once | ORAL | Status: DC | PRN

## 2024-06-29 MED ORDER — SODIUM CHLORIDE 0.9 % IR SOLN
Status: DC | PRN
  Administered 2024-06-29: 1000 mL

## 2024-06-29 MED ORDER — ONDANSETRON HCL 4 MG PO TABS: 4.0000 mg | ORAL_TABLET | ORAL | Status: DC | PRN

## 2024-06-29 MED ORDER — LIDOCAINE 2% (20 MG/ML) 5 ML SYRINGE
INTRAMUSCULAR | Status: AC
  Filled 2024-06-29: qty 5

## 2024-06-29 MED ORDER — FAMOTIDINE 20 MG PO TABS
40.0000 mg | ORAL_TABLET | Freq: Once | ORAL | Status: AC
  Administered 2024-06-29: 40 mg via ORAL
  Filled 2024-06-29: qty 2

## 2024-06-29 MED ORDER — ACETAMINOPHEN 10 MG/ML IV SOLN
INTRAVENOUS | Status: DC | PRN
Start: 2024-06-29 — End: 2024-06-29
  Administered 2024-06-29: 1000 mg via INTRAVENOUS

## 2024-06-29 MED ORDER — COCONUT OIL OIL
1.0000 | TOPICAL_OIL | Status: DC | PRN
Start: 2024-06-29 — End: 2024-07-01

## 2024-06-29 MED ORDER — LIDOCAINE HCL (CARDIAC) PF 100 MG/5ML IV SOSY
PREFILLED_SYRINGE | INTRAVENOUS | Status: DC | PRN
  Administered 2024-06-29: 100 mg via INTRAVENOUS

## 2024-06-29 MED ORDER — ONDANSETRON HCL 4 MG/2ML IJ SOLN
INTRAMUSCULAR | Status: DC | PRN
  Administered 2024-06-29: 4 mg via INTRAVENOUS

## 2024-06-29 MED ORDER — DIBUCAINE (PERIANAL) 1 % EX OINT: 1.0000 | TOPICAL_OINTMENT | CUTANEOUS | Status: DC | PRN

## 2024-06-29 MED ORDER — SUCCINYLCHOLINE CHLORIDE 200 MG/10ML IV SOSY
PREFILLED_SYRINGE | INTRAVENOUS | Status: AC
Start: 2024-06-29 — End: 2024-06-29
  Filled 2024-06-29: qty 10

## 2024-06-29 MED ORDER — ACETAMINOPHEN 10 MG/ML IV SOLN: 1000.0000 mg | Freq: Once | INTRAVENOUS | Status: DC | PRN

## 2024-06-29 MED ORDER — MIDAZOLAM HCL 5 MG/5ML IJ SOLN
INTRAMUSCULAR | Status: DC | PRN
  Administered 2024-06-29: 1.5 mg via INTRAVENOUS
  Administered 2024-06-29: .5 mg via INTRAVENOUS

## 2024-06-29 MED ORDER — ZOLPIDEM TARTRATE 5 MG PO TABS: 5.0000 mg | ORAL_TABLET | Freq: Every evening | ORAL | Status: DC | PRN

## 2024-06-29 MED ORDER — METOCLOPRAMIDE HCL 10 MG PO TABS
10.0000 mg | ORAL_TABLET | Freq: Once | ORAL | Status: AC
  Administered 2024-06-29: 10 mg via ORAL
  Filled 2024-06-29: qty 1

## 2024-06-29 MED ORDER — FENTANYL CITRATE (PF) 100 MCG/2ML IJ SOLN
INTRAMUSCULAR | Status: AC
  Filled 2024-06-29: qty 2

## 2024-06-29 MED ORDER — PROPOFOL 10 MG/ML IV BOLUS
INTRAVENOUS | Status: DC | PRN
  Administered 2024-06-29: 170 mg via INTRAVENOUS

## 2024-06-29 MED ORDER — ONDANSETRON HCL 4 MG/2ML IJ SOLN: 4.0000 mg | Freq: Once | INTRAMUSCULAR | Status: DC | PRN

## 2024-06-29 MED ORDER — PROPOFOL 10 MG/ML IV BOLUS
INTRAVENOUS | Status: AC
  Filled 2024-06-29: qty 20

## 2024-06-29 MED ORDER — SENNOSIDES-DOCUSATE SODIUM 8.6-50 MG PO TABS
2.0000 | ORAL_TABLET | ORAL | Status: DC
  Administered 2024-06-30: 2 via ORAL
  Filled 2024-06-29: qty 2

## 2024-06-29 MED ORDER — WITCH HAZEL-GLYCERIN EX PADS: 1.0000 | MEDICATED_PAD | CUTANEOUS | Status: DC | PRN

## 2024-06-29 MED ORDER — MIDAZOLAM HCL 2 MG/2ML IJ SOLN
INTRAMUSCULAR | Status: AC
  Filled 2024-06-29: qty 2

## 2024-06-29 MED ORDER — SODIUM CHLORIDE 0.9% FLUSH
3.0000 mL | Freq: Two times a day (BID) | INTRAVENOUS | Status: DC
  Administered 2024-06-29 (×3): 3 mL via INTRAVENOUS

## 2024-06-29 MED ORDER — IBUPROFEN 600 MG PO TABS
600.0000 mg | ORAL_TABLET | Freq: Four times a day (QID) | ORAL | Status: DC
  Administered 2024-06-29 – 2024-06-30 (×6): 600 mg via ORAL
  Filled 2024-06-29 (×6): qty 1

## 2024-06-29 MED ORDER — SODIUM CHLORIDE 0.9 % IV SOLN
250.0000 mL | INTRAVENOUS | Status: DC | PRN
Start: 2024-06-29 — End: 2024-07-01

## 2024-06-29 MED ORDER — KETOROLAC TROMETHAMINE 30 MG/ML IJ SOLN
INTRAMUSCULAR | Status: AC
  Filled 2024-06-29: qty 1

## 2024-06-29 MED ORDER — BENZOCAINE-MENTHOL 20-0.5 % EX AERO: 1.0000 | INHALATION_SPRAY | CUTANEOUS | Status: DC | PRN

## 2024-06-29 MED ORDER — DEXAMETHASONE SODIUM PHOSPHATE 10 MG/ML IJ SOLN
INTRAMUSCULAR | Status: AC
  Filled 2024-06-29: qty 1

## 2024-06-29 MED ORDER — KETOROLAC TROMETHAMINE 30 MG/ML IJ SOLN
INTRAMUSCULAR | Status: DC | PRN
  Administered 2024-06-29: 30 mg via INTRAVENOUS

## 2024-06-29 MED ORDER — ONDANSETRON HCL 4 MG/2ML IJ SOLN: 4.0000 mg | INTRAMUSCULAR | Status: DC | PRN

## 2024-06-29 MED ORDER — LACTATED RINGERS IV SOLN: INTRAVENOUS | Status: AC

## 2024-06-29 MED ORDER — ACETAMINOPHEN 10 MG/ML IV SOLN
INTRAVENOUS | Status: AC
  Filled 2024-06-29: qty 100

## 2024-06-29 MED ORDER — ONDANSETRON HCL 4 MG/2ML IJ SOLN
INTRAMUSCULAR | Status: AC
  Filled 2024-06-29: qty 2

## 2024-06-29 MED ORDER — FENTANYL CITRATE (PF) 250 MCG/5ML IJ SOLN
INTRAMUSCULAR | Status: AC
Start: 2024-06-29 — End: 2024-06-29
  Filled 2024-06-29: qty 5

## 2024-06-29 MED ORDER — FENTANYL CITRATE (PF) 100 MCG/2ML IJ SOLN
25.0000 ug | INTRAMUSCULAR | Status: DC | PRN
  Administered 2024-06-29: 25 ug via INTRAVENOUS
  Administered 2024-06-29: 50 ug via INTRAVENOUS

## 2024-06-29 MED ORDER — RHO D IMMUNE GLOBULIN 1500 UNIT/2ML IJ SOSY
300.0000 ug | PREFILLED_SYRINGE | Freq: Once | INTRAMUSCULAR | Status: DC
  Filled 2024-06-29: qty 2

## 2024-06-29 MED ORDER — SUCCINYLCHOLINE CHLORIDE 200 MG/10ML IV SOSY
PREFILLED_SYRINGE | INTRAVENOUS | Status: DC | PRN
Start: 2024-06-29 — End: 2024-06-29
  Administered 2024-06-29: 120 mg via INTRAVENOUS

## 2024-06-29 MED ORDER — OXYCODONE HCL 5 MG/5ML PO SOLN: 5.0000 mg | Freq: Once | ORAL | Status: DC | PRN

## 2024-06-29 MED ORDER — PHENYLEPHRINE 80 MCG/ML (10ML) SYRINGE FOR IV PUSH (FOR BLOOD PRESSURE SUPPORT)
PREFILLED_SYRINGE | INTRAVENOUS | Status: AC
Start: 2024-06-29 — End: 2024-06-29
  Filled 2024-06-29: qty 10

## 2024-06-29 MED ORDER — SODIUM CHLORIDE 0.9% FLUSH: 3.0000 mL | INTRAVENOUS | Status: DC | PRN

## 2024-06-29 MED ORDER — DIPHENHYDRAMINE HCL 25 MG PO CAPS: 25.0000 mg | ORAL_CAPSULE | Freq: Four times a day (QID) | ORAL | Status: DC | PRN

## 2024-06-29 MED ORDER — STERILE WATER FOR IRRIGATION IR SOLN
Status: DC | PRN
  Administered 2024-06-29: 1000 mL

## 2024-06-29 MED ORDER — FENTANYL CITRATE (PF) 100 MCG/2ML IJ SOLN
INTRAMUSCULAR | Status: DC | PRN
  Administered 2024-06-29 (×2): 100 ug via INTRAVENOUS

## 2024-06-29 MED ORDER — EPHEDRINE SULFATE-NACL 50-0.9 MG/10ML-% IV SOSY
PREFILLED_SYRINGE | INTRAVENOUS | Status: DC | PRN
  Administered 2024-06-29: 10 mg via INTRAVENOUS

## 2024-06-29 MED ORDER — PRENATAL MULTIVITAMIN CH
1.0000 | ORAL_TABLET | Freq: Every day | ORAL | Status: DC
  Administered 2024-06-30: 1 via ORAL
  Filled 2024-06-29: qty 1

## 2024-06-29 MED ORDER — SIMETHICONE 80 MG PO CHEW: 80.0000 mg | CHEWABLE_TABLET | ORAL | Status: DC | PRN

## 2024-06-29 MED ORDER — DEXMEDETOMIDINE HCL IN NACL 80 MCG/20ML IV SOLN
INTRAVENOUS | Status: DC | PRN
Start: 2024-06-29 — End: 2024-06-29
  Administered 2024-06-29 (×2): 4 ug via INTRAVENOUS

## 2024-06-29 MED ORDER — ACETAMINOPHEN 325 MG PO TABS: 650.0000 mg | ORAL_TABLET | ORAL | Status: DC | PRN

## 2024-06-29 MED ORDER — LACTATED RINGERS IV SOLN: INTRAVENOUS | Status: DC | PRN

## 2024-06-29 SURGICAL SUPPLY — 25 items
BLADE SURG 15 STRL LF C SS BP (BLADE) ×2 IMPLANT
DISSECTOR SURG LIGASURE 21 (MISCELLANEOUS) ×2 IMPLANT
DRSG OPSITE POSTOP 3X4 (GAUZE/BANDAGES/DRESSINGS) ×2 IMPLANT
DURAPREP 26ML APPLICATOR (WOUND CARE) ×2 IMPLANT
GAUZE SPONGE 2X2 STRL 8-PLY (GAUZE/BANDAGES/DRESSINGS) ×2 IMPLANT
GLOVE BIO SURGEON STRL SZ7.5 (GLOVE) ×2 IMPLANT
GLOVE BIOGEL PI IND STRL 7.0 (GLOVE) ×2 IMPLANT
GLOVE BIOGEL PI IND STRL 8 (GLOVE) ×2 IMPLANT
GOWN STRL REUS W/TWL LRG LVL3 (GOWN DISPOSABLE) ×2 IMPLANT
GOWN STRL REUS W/TWL XL LVL3 (GOWN DISPOSABLE) ×2 IMPLANT
LIGASURE IMPACT 36 18CM CVD LR (INSTRUMENTS) ×2 IMPLANT
NEEDLE HYPO 22GX1.5 SAFETY (NEEDLE) ×2 IMPLANT
NS IRRIG 1000ML POUR BTL (IV SOLUTION) ×2 IMPLANT
PACK ABDOMINAL MINOR (CUSTOM PROCEDURE TRAY) ×2 IMPLANT
PROTECTOR NERVE ULNAR (MISCELLANEOUS) ×2 IMPLANT
SPONGE LAP 4X18 RFD (DISPOSABLE) ×2 IMPLANT
SUT MNCRL AB 3-0 PS2 27 (SUTURE) ×2 IMPLANT
SUT MON AB 2-0 CT1 36 (SUTURE) IMPLANT
SUT PLAIN 0 NONE (SUTURE) IMPLANT
SUT VIC AB 2-0 CT1 (SUTURE) IMPLANT
SUT VICRYL 0 UR6 27IN ABS (SUTURE) ×2 IMPLANT
SYR CONTROL 10ML LL (SYRINGE) ×2 IMPLANT
TOWEL OR 17X24 6PK STRL BLUE (TOWEL DISPOSABLE) ×4 IMPLANT
TRAY FOLEY W/BAG SLVR 14FR (SET/KITS/TRAYS/PACK) ×2 IMPLANT
WATER STERILE IRR 1000ML POUR (IV SOLUTION) ×2 IMPLANT

## 2024-06-29 NOTE — Anesthesia Procedure Notes (Signed)
 Procedure Name: Intubation Date/Time: 06/29/2024 11:32 AM  Performed by: Steen Clarita CROME, CRNAPre-anesthesia Checklist: Patient identified, Patient being monitored, Timeout performed, Emergency Drugs available and Suction available Patient Re-evaluated:Patient Re-evaluated prior to induction Oxygen Delivery Method: Circle System Utilized Preoxygenation: Pre-oxygenation with 100% oxygen Induction Type: IV induction Ventilation: Mask ventilation without difficulty Laryngoscope Size: Miller and 2 Grade View: Grade II Tube type: Oral Tube size: 7.0 mm Number of attempts: 1 Airway Equipment and Method: stylet Placement Confirmation: ETT inserted through vocal cords under direct vision, positive ETCO2 and breath sounds checked- equal and bilateral Secured at: 21 cm Tube secured with: Tape Dental Injury: Teeth and Oropharynx as per pre-operative assessment

## 2024-06-29 NOTE — Anesthesia Postprocedure Evaluation (Signed)
 Anesthesia Post Note  Patient: Aimee Burns  Procedure(s) Performed: LIGATION, FALLOPIAN TUBE, POSTPARTUM (Bilateral: Abdomen)     Patient location during evaluation: PACU Anesthesia Type: General Level of consciousness: awake and alert Pain management: pain level controlled Vital Signs Assessment: post-procedure vital signs reviewed and stable Respiratory status: spontaneous breathing, nonlabored ventilation, respiratory function stable and patient connected to nasal cannula oxygen Cardiovascular status: blood pressure returned to baseline and stable Postop Assessment: no apparent nausea or vomiting Anesthetic complications: no   No notable events documented.  Last Vitals:  Vitals:   06/29/24 1247 06/29/24 1300  BP:  130/86  Pulse: 81 80  Resp: 16 15  Temp:    SpO2: 97% 96%    Last Pain:  Vitals:   06/29/24 1247  TempSrc:   PainSc: 4    Pain Goal: Patients Stated Pain Goal: 4 (06/29/24 1247)                 [name]

## 2024-06-29 NOTE — Transfer of Care (Signed)
 Immediate Anesthesia Transfer of Care Note  Patient: Aimee Burns  Procedure(s) Performed: LIGATION, FALLOPIAN TUBE, POSTPARTUM (Bilateral: Abdomen)  Patient Location: PACU  Anesthesia Type:General  Level of Consciousness: awake  Airway & Oxygen Therapy: Patient Spontanous Breathing  Post-op Assessment: Report given to RN and Post -op Vital signs reviewed and stable  Post vital signs: Reviewed and stable  Last Vitals:  Vitals Value Taken Time  BP 126/69 06/29/24 12:31  Temp    Pulse 88 06/29/24 12:33  Resp 17 06/29/24 12:33  SpO2 97 % 06/29/24 12:33  Vitals shown include unfiled device data.  Last Pain:  Vitals:   06/29/24 1037  TempSrc: Oral  PainSc:          Complications: No notable events documented.

## 2024-06-29 NOTE — Social Work (Signed)
 [name] was referred for history of depression/anxiety.  * Referral screened out by Clinical Social Worker because none of the following criteria appear to apply:  ~ History of anxiety/depression during this pregnancy, or of post-partum depression following prior delivery.  ~ Diagnosis of anxiety and/or depression within last 3 years OR * [name]'s symptoms currently being treated with medication and/or therapy.  Per chart review [name] diagnosis dated back to 2019, per OB records no MH concerns noted during this pregnancy. Edinburgh=2  Please contact the Clinical Social Worker if needs arise, or by [name] request.   Aimee Burns, LCSWA Clinical Social Worker [phone]

## 2024-06-29 NOTE — Anesthesia Procedure Notes (Signed)
 Spinal  Patient location during procedure: OR Reason for block: surgical anesthesia Staffing Performed: anesthesiologist  Anesthesiologist: Boone Fess, MD Performed by: Boone Fess, MD Authorized by: Boone Fess, MD   Preanesthetic Checklist Completed: patient identified, IV checked, site marked, risks and benefits discussed, surgical consent, monitors and equipment checked, pre-op evaluation and timeout performed Spinal Block Patient position: sitting Prep: ChloraPrep and site prepped and draped Patient monitoring: heart rate, continuous pulse ox, blood pressure and cardiac monitor Approach: midline Location: L3-4 Injection technique: single-shot Needle Needle type: Whitacre and Introducer  Needle gauge: 24 G Needle length: 9 cm Assessment Sensory level: T10 Events: failed spinal Additional Notes Unable to access thecal space despite two entry sites/attempts. Converted to GETA Meticulous sterile technique used throughout (CHG prep, sterile gloves, sterile drape).

## 2024-06-29 NOTE — Anesthesia Preprocedure Evaluation (Signed)
 Anesthesia Evaluation  Patient identified by MRN, date of birth, ID band Patient awake    Reviewed: Allergy & Precautions, NPO status , Patient's Chart, lab work & pertinent test results  History of Anesthesia Complications Negative for: history of anesthetic complications  Airway Mallampati: II  TM Distance: >3 FB Neck ROM: Full    Dental no notable dental hx. (+) Teeth Intact   Pulmonary neg pulmonary ROS, neg sleep apnea, neg COPD, Patient abstained from smoking.Not current smoker   Pulmonary exam normal breath sounds clear to auscultation       Cardiovascular Exercise Tolerance: Good METS(-) hypertension(-) CAD and (-) Past MI negative cardio ROS (-) dysrhythmias  Rhythm:Regular Rate:Normal - Systolic murmurs    Neuro/Psych  PSYCHIATRIC DISORDERS Anxiety     negative neurological ROS  negative psych ROS   GI/Hepatic ,neg GERD  ,,(+)     (-) substance abuse    Endo/Other  neg diabetes    Renal/GU negative Renal ROS     Musculoskeletal   Abdominal   Peds  Hematology  (+) Blood dyscrasia, anemia Denies blood thinner use or bleeding disorders.    Anesthesia Other Findings Past Medical History: No date: Pregnancy No date: Renal calculi     Comment:  2011,right parathyroidectomy No date: Thyroid tumor, benign No date: Vaginal Pap smear, abnormal  Reproductive/Obstetrics                              Anesthesia Physical Anesthesia Plan  ASA: 2  Anesthesia Plan: Spinal   Post-op Pain Management: Ofirmev  IV (intra-op)* and Toradol  IV (intra-op)*   Induction:   PONV Risk Score and Plan: 3 and Ondansetron , Dexamethasone  and Midazolam   Airway Management Planned: Natural Airway  Additional Equipment:   Intra-op Plan:   Post-operative Plan:   Informed Consent: I have reviewed the patients History and Physical, chart, labs and discussed the procedure including the risks,  benefits and alternatives for the proposed anesthesia with the patient or authorized representative who has indicated his/her understanding and acceptance.       Plan Discussed with: CRNA and Surgeon  Anesthesia Plan Comments: (Discussed R/B/A of neuraxial anesthesia technique with patient: - rare risks of spinal/epidural hematoma, nerve damage, infection - Risk of PDPH - Risk of nausea and vomiting - Risk of conversion to general anesthesia and its associated risks, including sore throat, damage to lips/teeth/oropharynx, and rare risks such as cardiac and respiratory events. - Risk of surgical bleeding requiring blood products - Risk of allergic reactions Discussed the role of CRNA in patient's perioperative care.  Patient voiced understanding.)         Anesthesia Quick Evaluation

## 2024-06-29 NOTE — Progress Notes (Signed)
 POSTPARTUM PROGRESS NOTE  Post Partum Day 1  Subjective:  Aimee Burns is a 33 y.o. [phone] s/p SVD at [redacted]w[redacted]d.  No acute events overnight.  Pt denies problems with ambulating, voiding or po intake.  She denies nausea or vomiting.  Pain is well controlled.  She has had flatus. She has not had bowel movement.  Lochia Moderate.   Objective: Blood pressure (!) 114/56, pulse 80, temperature 97.6 F (36.4 C), temperature source Oral, resp. rate 18, height 5' 9 (1.753 m), weight 95.4 kg, last menstrual period 09/24/2023, SpO2 96%.  Physical Exam:  General: alert, cooperative and no distress Chest: no respiratory distress Heart:regular rate, distal pulses intact Abdomen: soft, nontender,  Uterine Fundus: firm, appropriately tender DVT Evaluation: No calf swelling or tenderness Extremities: no edema, +varicose vein, negative Homans Skin: warm, dry  Recent Labs    06/28/24 2013 06/29/24 0512  HGB 10.3* 9.2*  HCT 31.3* 27.0*    Assessment/Plan: Aimee Burns is a 33 y.o. [phone] s/p SVD at [redacted]w[redacted]d   PPD#1 - Doing well Contraception: BTL planned for today Feeding: formula Dispo: Plan for discharge 7/30.  Rhogam ordered   LOS: 1 day   Sarely Stracener, Hobert BROCKS, MD, CNM 06/29/2024, 7:22 AM

## 2024-06-29 NOTE — Op Note (Signed)
 Aimee Burns 06/29/2024  PREOPERATIVE DIAGNOSIS:  Undesired fertility  POSTOPERATIVE DIAGNOSIS:  Undesired fertility  PROCEDURE:  Postpartum Bilateral Tubal Sterilization using Ligasure   SURGEON:  Dr Lang Peel  ANESTHESIA:  Epidural  COMPLICATIONS:  None immediate.  ESTIMATED BLOOD LOSS:  Less than 20cc.  FLUIDS: 800 mL LR.  URINE OUTPUT:  100 mL of clear urine.  INDICATIONS: 33 y.o. yo [phone]  with undesired fertility,status post vaginal delivery, desires permanent sterilization. Risks and benefits of procedure discussed with patient including permanence of method, bleeding, infection, injury to surrounding organs and need for additional procedures. Risk failure of 0.5-1% with increased risk of ectopic gestation if pregnancy occurs was also discussed with patient.   FINDINGS:  Normal uterus, tubes, and ovaries.  TECHNIQUE:  The patient was taken to the operating room where her epidural anesthesia was dosed up to surgical level and found to be adequate.  She was then placed in the dorsal supine position and prepped and draped in sterile fashion.  After an adequate timeout was performed, attention was turned to the patient's abdomen where a small transverse skin incision was made under the umbilical fold. The incision was taken down to the layer of fascia using the scalpel, and fascia was incised, and extended bilaterally using Mayo scissors. The peritoneum was entered in a sharp fashion.   Attention was then turned to the patient's adnexa. The left fallopian tube was identified and followed out to the fimbriated end.  Ligasure device was used to cauterize and cut the mesosalpinx to proximal end of the fallopian tube, removing 6cm of tube. A similar process was carried out on the right side allowing for bilateral tubal sterilization.    Good hemostasis was noted overall.  Local analgesia was drizzled on both operative sites.The instruments were then removed from the  patient's abdomen and the fascial incision was repaired with 0 Vicryl, and the skin was closed with a 3-0 Monocryl subcuticular stitch. The patient tolerated the procedure well.  Sponge, lap, and needle counts were correct times two.  The patient was then taken to the recovery room awake, extubated and in stable condition.   [name], MD 06/29/2024 3:51 PM

## 2024-06-29 NOTE — Patient Instructions (Signed)
 If interested in an outpatient lactation consult in office or virtually please reach out to us  at [facility] (First Floor) [address], [place] Please call [phone] and press 4 for lactation.   -- Lactation support groups:  [facility], Tuesdays 10:00 am -12:00 pm at [address] on the second floor in the conference room, lactating parents and lap babies welcome.  [email or web address]  [email or web address]     Vermell CINDERELLA Pelt, [facility] Center for [facility]

## 2024-06-29 NOTE — Progress Notes (Signed)
Patient desires permanent sterilization.  Other reversible forms of contraception were discussed with patient; she declines all other modalities. Risks of procedure discussed with patient including but not limited to: risk of regret, permanence of method, bleeding, infection, injury to surrounding organs and need for additional procedures.  Failure risk of about 1% with increased risk of ectopic gestation if pregnancy occurs was also discussed with patient.  Also discussed possibility of post-tubal pain syndrome. The patient concurred with the proposed plan, giving informed written consent for the procedures.  Patient has been NPO since last night she will remain NPO for procedure. Anesthesia and OR aware.  

## 2024-06-30 ENCOUNTER — Encounter: Admitting: Obstetrics and Gynecology

## 2024-06-30 MED ORDER — FERROUS SULFATE 325 (65 FE) MG PO TABS: 325.0000 mg | ORAL_TABLET | ORAL | Status: DC

## 2024-06-30 MED ORDER — IBUPROFEN 600 MG PO TABS: 600.0000 mg | ORAL_TABLET | Freq: Four times a day (QID) | ORAL | 0 refills | Status: AC

## 2024-06-30 MED ORDER — POLYETHYLENE GLYCOL 3350 17 GM/SCOOP PO POWD: 17.0000 g | Freq: Every day | ORAL | 1 refills | Status: DC | PRN

## 2024-06-30 MED ORDER — ACETAMINOPHEN 325 MG PO TABS: 650.0000 mg | ORAL_TABLET | ORAL | 0 refills | Status: AC | PRN

## 2024-06-30 MED ORDER — FERROUS SULFATE 325 (65 FE) MG PO TABS: 325.0000 mg | ORAL_TABLET | ORAL | 0 refills | Status: AC

## 2024-07-01 LAB — SURGICAL PATHOLOGY

## 2024-07-06 ENCOUNTER — Encounter: Payer: Self-pay | Admitting: Women's Health

## 2024-07-06 ENCOUNTER — Encounter: Payer: Self-pay | Admitting: *Deleted

## 2024-07-07 ENCOUNTER — Telehealth (INDEPENDENT_AMBULATORY_CARE_PROVIDER_SITE_OTHER): Admitting: Adult Health

## 2024-07-07 ENCOUNTER — Inpatient Hospital Stay (HOSPITAL_COMMUNITY)

## 2024-07-07 ENCOUNTER — Inpatient Hospital Stay (HOSPITAL_COMMUNITY): Admission: RE | Admit: 2024-07-07 | Source: Home / Self Care | Admitting: Obstetrics & Gynecology

## 2024-07-07 ENCOUNTER — Encounter: Payer: Self-pay | Admitting: Adult Health

## 2024-07-07 DIAGNOSIS — F53 Postpartum depression: Secondary | ICD-10-CM | POA: Diagnosis not present

## 2024-07-07 MED ORDER — ESCITALOPRAM OXALATE 10 MG PO TABS: 10.0000 mg | ORAL_TABLET | Freq: Every day | ORAL | 2 refills | Status: DC

## 2024-07-07 NOTE — Progress Notes (Addendum)
 Patient ID: Aimee Burns, female   DOB: 12-Sep-1991, 33 y.o.   MRN: [id number]   TELEHEALTH GYNECOLOGY VISIT ENCOUNTER NOTE  Provider location: [facility]   Patient location: Home  I connected with Aimee Burns on 07/07/24 at 11:50 AM EDT by telephone and verified that I am speaking with the correct person using two identifiers. Patient was unable to do MyChart audiovisual encounter due to technical difficulties, she tried several times.    I discussed the limitations, risks, security and privacy concerns of performing an evaluation and management service by telephone and the availability of in person appointments. I also discussed with the patient that there may be a patient responsible charge related to this service. The patient expressed understanding and agreed to proceed.   History:  Aimee Burns is a 33 y.o. [phone] female being evaluated today for postpartum depression, she is stressing over everything and wants to isolate. She had a vaginal delivery 06/28/24, a boy, her 4th. She denies any SI or HI, heavy bleeding, pelvic pain or other concerns.   Still bleeding but light. HGB was 9.2, when left hospital, take iron and PNV.     Past Medical History:  Diagnosis Date   Pregnancy    Renal calculi    2011,right parathyroidectomy   Thyroid tumor, benign    Vaginal Pap smear, abnormal    Past Surgical History:  Procedure Laterality Date   Parathyroid  gland removed     PARATHYROIDECTOMY / EXPLORATION OF PARATHYROIDS Right 2011   [place], at age18   THYROIDECTOMY, PARTIAL Right 2011   TUBAL LIGATION Bilateral 06/29/2024   Procedure: LIGATION, FALLOPIAN TUBE, POSTPARTUM;  Surgeon: Ilean Norleen GAILS, MD;  Location: [facility] LD ORS;  Service: Gynecology;  Laterality: Bilateral;   The following portions of the patient's history were reviewed and updated as appropriate: allergies, current medications, past family history, past  medical history, past social history, past surgical history and problem list.   Health Maintenance: Pap was ASCUS 06/30/23 with PCP.   Review of Systems:  Pertinent items noted in HPI and remainder of comprehensive ROS otherwise negative.  Physical Exam:   General:  Alert, oriented and cooperative.   Mental Status: Normal mood and affect perceived. Normal judgment and thought content.  Physical exam deferred due to nature of the encounter Breastfeeding No   Edinburgh score  is 48, was 0 when left hospital. Labs and Imaging Results for orders placed or performed during the hospital encounter of 06/28/24 (from the past 2 weeks)  CBC   Collection Time: 06/28/24  8:13 PM  Result Value Ref Range   WBC 13.9 (H) 4.0 - 10.5 K/uL   RBC 3.43 (L) 3.87 - 5.11 MIL/uL   Hemoglobin 10.3 (L) 12.0 - 15.0 g/dL   HCT 68.6 (L) 63.9 - 53.9 %   MCV 91.3 80.0 - 100.0 fL   MCH 30.0 26.0 - 34.0 pg   MCHC 32.9 30.0 - 36.0 g/dL   RDW 85.8 88.4 - 84.4 %   Platelets 244 150 - 400 K/uL   nRBC 0.0 0.0 - 0.2 %  RPR   Collection Time: 06/28/24  8:13 PM  Result Value Ref Range   RPR Ser Ql NON REACTIVE NON REACTIVE  Type and screen [facility]   Collection Time: 06/28/24  8:13 PM  Result Value Ref Range   ABO/RH(D) A NEG    Antibody Screen POS    Sample Expiration 07/01/2024,2359  Antibody Identification      PASSIVELY ACQUIRED ANTI-D Performed at [facility], [address], [place], [place] [address]   CBC   Collection Time: 06/29/24  5:12 AM  Result Value Ref Range   WBC 18.9 (H) 4.0 - 10.5 K/uL   RBC 2.97 (L) 3.87 - 5.11 MIL/uL   Hemoglobin 9.2 (L) 12.0 - 15.0 g/dL   HCT 72.9 (L) 63.9 - 53.9 %   MCV 90.9 80.0 - 100.0 fL   MCH 31.0 26.0 - 34.0 pg   MCHC 34.1 30.0 - 36.0 g/dL   RDW 85.8 88.4 - 84.4 %   Platelets 242 150 - 400 K/uL   nRBC 0.0 0.0 - 0.2 %  Surgical pathology   Collection Time: 06/29/24 11:47 AM  Result Value Ref Range   SURGICAL PATHOLOGY       SURGICAL PATHOLOGY CASE: [id number] PATIENT: Aimee Burns Surgical Pathology Report     Clinical History: desires sterilization     FINAL MICROSCOPIC DIAGNOSIS:  A.  RIGHT AND LEFT FALLOPIAN TUBE, BILATERAL SALPINGECTOMY: Benign fallopian tubes Small benign paratubal cyst   GROSS DESCRIPTION:  A. Received fresh and subsequently placed in formalin labeled with the patient's name and Left and right fallopian tubes are two undesignated red-tan, fimbriated fallopian tubes, 13.5 x 0.3 cm and 14.4 x 0.3 cm, with unremarkable cut and serosal surfaces. The fimbria and representative cross sections are each submitted in their own cassettes (2 blocks total).  (LEF 06/30/2024)   Final Diagnosis performed by Prentice Pitcher, MD.   Electronically signed 07/01/2024 Technical component performed at [facility], [address] [place], [place] [address].  Professional component performed at [employer], [address], [place], [place] [address].  Immunohistochemistry Technical component (if applicable) was performed at [facility]. [address], [place], [place] [address].   IMMUNOHISTOCHEMISTRY DISCLAIMER (if applicable): Some of these immunohistochemical stains may have been developed and the performance characteristics determine by [facility] Some may not have been cleared or approved by the U.S. Food and Drug Administration. The FDA has determined that such clearance or approval is not necessary. This test is used for clinical purposes. It should not be regarded as investigational or for research. This laboratory is certified under the Clinical Laboratory Improvement Amendments of 1988 (CLIA-88) as qualified to perform high complexity clinical laboratory testing.  The controls stained appropriately.   IHC stains are performed on formalin fixed, paraffin embedded tissue using a  3,3diaminobenzidine (DAB) chromogen and Leica [employer] Autostaine r System. The staining intensity of the nucleus is score manually and is reported as the percentage of tumor cell nuclei demonstrating specific nuclear staining. The specimens are fixed in 10% Neutral Formalin for at least 6 hours and up to 72hrs. These tests are validated on decalcified tissue. Results should be interpreted with caution given the possibility of false negative results on decalcified specimens. Antibody Clones are as follows ER-clone [id number], PR-clone 16, Ki67- clone MM1. Some of these immunohistochemical stains may have been developed and the performance characteristics determined by [facility].   Results for orders placed or performed in visit on 06/23/24 (from the past 2 weeks)  POC Urinalysis Dipstick OB   Collection Time: 06/23/24  3:32 PM  Result Value Ref Range   Color, UA     Clarity, UA     Glucose, UA Negative Negative   Bilirubin, UA     Ketones, UA neg  Spec Grav, UA     Blood, UA neg    pH, UA     POC,PROTEIN,UA Negative Negative, Trace, Small (1+), Moderate (2+), Large (3+), 4+   Urobilinogen, UA     Nitrite, UA neg    Leukocytes, UA Negative Negative   Appearance     Odor     US  OB Follow Up Result Date: 06/16/2024 Table formatting from the original result was not included. Images from the original result were not included.  ..an Financial trader of Ultrasound Medicine Technical sales engineer) accredited practice Center for [facility] [address] [place] [address] Ordering Provider: Jayne Vonn DEL, MD FOLLOW UP SONOGRAM Chauntelle Azpeitia is in the office for a follow up sonogram for EFW. She is a 33 y.o. year old [id number] with Estimated Date of Delivery: 06/30/24 by LMP now at  [redacted]w[redacted]d weeks gestation. Thus far the pregnancy has been complicated by marginal CI. GESTATION: SINGLETON PRESENTATION: cephalic FETAL ACTIVITY:          Heart rate         138          The  fetus is active. AMNIOTIC FLUID: The amniotic fluid volume is  normal, 17 cm. PLACENTA LOCALIZATION:  Left lateral placenta GRADE 0 CERVIX: Limited view ADNEXA: WNL GESTATIONAL AGE AND  BIOMETRICS: Gestational criteria: Estimated Date of Delivery: 06/30/24 by LMP now at [redacted]w[redacted]d Previous Scans:4          BIPARIETAL DIAMETER           9.36 cm         38+1 weeks HEAD CIRCUMFERENCE           34.55 cm         40 weeks ABDOMINAL CIRCUMFERENCE           36.41 cm         40+2 weeks FEMUR LENGTH           7.87 cm         40+2 weeks                                                       AVERAGE EGA(BY THIS SCAN):  39+5 weeks                                                 ESTIMATED FETAL WEIGHT:       3940  grams, 95 % ANATOMICAL SURVEY                                                                            COMMENTS CEREBRAL VENTRICLES yes normal  CHOROID PLEXUS yes normal  CEREBELLUM yes normal  CISTERNA MAGNA  Yes  normal   CAVUM SEPTI PELLUCIDI YES NORMAL                  FACIAL PROFILE yes normal  4 CHAMBERED HEART yes normal  OUTFLOW TRACTS YES normaL  3VV YES NORMAL  3VTV YES NORMAL  SITUS YES NORMAL      DIAPHRAGM yes normal  STOMACH yes normal  RENAL REGION yes normal  BLADDER yes normal          3 VESSEL CORD yes normal              GENITALIA yes normal female     SUSPECTED ABNORMALITIES:  EFW 95% QUALITY OF SCAN: satisfactory TECHNICIAN COMMENTS: US  38 wks,cephalic,left lateral placenta gr 0,marginal CI not seen on today scan,FHR 138 BPM,AFI 17 cm,EFW 3940 g 95% A copy of this report including all images has been saved and backed up to a second source for retrieval if needed. All measures and details of the anatomical scan, placentation, fluid volume and pelvic anatomy are contained in that report. Amber JINNY Pitts 06/16/2024 9:14 AM Clinical Impression and recommendations: I have reviewed the sonogram results above, combined with the patient's current clinical course, below are my impressions and any appropriate  recommendations for management based on the sonographic findings. 1.  [id number] Estimated Date of Delivery: 06/30/24 by serial sonographic evaluations 2.  Fetal sonographic surveillance findings: a). Normal fluid volume b). Suspected large for gestational age, current growth percentile:  95% 3.  Normal general sonographic findings Recommend continued prenatal evaluations and care based on this sonogram and as clinically indicated from the patient's clinical course. Delon HERO Ozan 06/16/2024 9:05 PM        Upstream - 07/07/24 1243       Pregnancy Intention Screening   Does the patient want to become pregnant in the next year? N/A    Does the patient's partner want to become pregnant in the next year? N/A    Would the patient like to discuss contraceptive options today? N/A      Contraception Wrap Up   Current Method Female Sterilization    End Method Female Sterilization    Contraception Counseling Provided No    How was the end contraceptive method provided? N/A          Assessment and Plan:     1. Postpartum depression (Primary) Stressing over every thing and wants to isolate Denies any SI, or HI Will rx lexapro  10 mg 1 daily Meds ordered this encounter  Medications   escitalopram  (LEXAPRO ) 10 MG tablet    Sig: Take 1 tablet (10 mg total) by mouth daily.    Dispense:  30 tablet    Refill:  2    Supervising Provider:   JAYNE VONN DEL [[id number]]   Has appt for postpartum visit 08/11/24, call if needs anything before then      I discussed the assessment and treatment plan with the patient. The patient was provided an opportunity to ask questions and all were answered. The patient agreed with the plan and demonstrated an understanding of the instructions.   The patient was advised to call back or seek an in-person evaluation/go to the ED if the symptoms worsen or if the condition fails to improve as anticipated.  I provided 10 minutes of non-face-to-face time during this  encounter.   Delon Lewis, NP Center for [employer], [facility]

## 2024-07-13 DIAGNOSIS — Z419 Encounter for procedure for purposes other than remedying health state, unspecified: Secondary | ICD-10-CM | POA: Diagnosis not present

## 2024-07-13 DIAGNOSIS — Z9851 Tubal ligation status: Secondary | ICD-10-CM

## 2024-07-22 ENCOUNTER — Encounter: Payer: Self-pay | Admitting: Women's Health

## 2024-08-11 ENCOUNTER — Encounter: Payer: Self-pay | Admitting: Women's Health

## 2024-08-11 ENCOUNTER — Other Ambulatory Visit (HOSPITAL_COMMUNITY)
Admission: RE | Admit: 2024-08-11 | Discharge: 2024-08-11 | Disposition: A | Discharge status: Home / Self Care | Source: Ambulatory Visit | Attending: Women's Health | Admitting: Women's Health

## 2024-08-11 ENCOUNTER — Ambulatory Visit: Admitting: Women's Health

## 2024-08-11 DIAGNOSIS — N898 Other specified noninflammatory disorders of vagina: Secondary | ICD-10-CM

## 2024-08-11 DIAGNOSIS — Z3A00 Weeks of gestation of pregnancy not specified: Secondary | ICD-10-CM | POA: Diagnosis not present

## 2024-08-11 DIAGNOSIS — O9081 Anemia of the puerperium: Secondary | ICD-10-CM

## 2024-08-11 DIAGNOSIS — R87619 Unspecified abnormal cytological findings in specimens from cervix uteri: Secondary | ICD-10-CM | POA: Insufficient documentation

## 2024-08-11 LAB — POCT HEMOGLOBIN: Hemoglobin: 12.1 g/dL (ref 11–14.6)

## 2024-08-11 NOTE — Progress Notes (Signed)
 POSTPARTUM VISIT Patient name: Aimee Burns MRN [id number]  Date of birth: Feb 17, 1991 Chief Complaint:   Postpartum Care  History of Present Illness:   Aimee Burns is a 33 y.o. [phone] Caucasian female being seen today for a postpartum visit. She is 6 weeks postpartum following a spontaneous vaginal delivery at 39.5 gestational weeks. IOL: no, for n/a. Anesthesia: none.  Laceration: none.  Complications: none. Inpatient contraception: yes BTS.  Taking po Fe (hgb 9.2 in hospital).  Pregnancy uncomplicated. Tobacco use: no. Substance use disorder: no. Last pap smear: 06/30/23 and results were ASCUS w/ HRHPV negative. Next pap smear due: 2027 Patient's last menstrual period was 09/24/2023 (exact date).  Postpartum course has been uncomplicated. Some vaginal d/c, no itching/odor/irritation. Bleeding none. Bowel function is normal. Bladder function is normal. Urinary incontinence? no, fecal incontinence? no Patient is not sexually active. Last sexual activity: prior to birth of baby. Desired contraception: s/p BTS. Patient does not want a pregnancy in the future.  Desired family size is 4 children.   Upstream - 08/11/24 1035       Pregnancy Intention Screening   Does the patient want to become pregnant in the next year? No    Does the patient's partner want to become pregnant in the next year? No    Would the patient like to discuss contraceptive options today? No      Contraception Wrap Up   Current Method Female Sterilization    End Method Female Sterilization    Contraception Counseling Provided No         The pregnancy intention screening data noted above was reviewed. Potential methods of contraception were discussed. The patient elected to proceed with Female Sterilization.  Edinburgh Postpartum Depression Screening: negative  Edinburgh Postnatal Depression Scale - 08/11/24 1039       Edinburgh Postnatal Depression Scale:  In the Past 7 Days   I have been  able to laugh and see the funny side of things. 0    I have looked forward with enjoyment to things. 0    I have blamed myself unnecessarily when things went wrong. 0    I have been anxious or worried for no good reason. 0    I have felt scared or panicky for no good reason. 0    Things have been getting on top of me. 0    I have been so unhappy that I have had difficulty sleeping. 0    I have felt sad or miserable. 0    I have been so unhappy that I have been crying. 0    The thought of harming myself has occurred to me. 0    Edinburgh Postnatal Depression Scale Total 0             04/06/2024    9:30 AM 12/16/2023    1:49 PM 10/13/2020   10:06 AM  GAD 7 : Generalized Anxiety Score  Nervous, Anxious, on Edge 0 0 0  Control/stop worrying 0 0 0  Worry too much - different things 0 0 0  Trouble relaxing 0 0 0  Restless 0 0 0  Easily annoyed or irritable 0 0 0  Afraid - awful might happen 0 0 0  Total GAD 7 Score 0 0 0     Baby's course has been uncomplicated. Baby is feeding by bottle. Infant has a pediatrician/family doctor? Yes.  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  Pt has material needs met for her and  baby: Yes.   Review of Systems:   Pertinent items are noted in HPI Denies Abnormal vaginal discharge w/ itching/odor/irritation, headaches, visual changes, shortness of breath, chest pain, abdominal pain, severe nausea/vomiting, or problems with urination or bowel movements. Pertinent History Reviewed:  Reviewed past medical,surgical, obstetrical and family history.  Reviewed problem list, medications and allergies. OB History  Gravida Para Term Preterm AB Living  4 4 4   4   SAB IAB Ectopic Multiple Live Births     0 4    # Outcome Date GA Lbr Len/2nd Weight Sex Type Anes PTL Lv  4 Term 06/28/24 [redacted]w[redacted]d   M Vag-Spont   LIV  3 Term 04/17/21 [redacted]w[redacted]d 05:27 / 01:28 8 lb 6.2 oz (3.805 kg) M Vag-Spont EPI  LIV  2 Term 03/06/17 [redacted]w[redacted]d / 01:15 8 lb 3 oz (3.714 kg) M  Vag-Spont EPI  LIV  1 Term 05/17/13 [redacted]w[redacted]d 22:15 / 00:39 5 lb 8.2 oz (2.5 kg) M Vag-Vacuum EPI  LIV   Physical Assessment:   Vitals:   08/11/24 1043  BP: 135/83  Pulse: 76  Weight: 187 lb (84.8 kg)  Height: 5' 8 (1.727 m)  Body mass index is 28.43 kg/m.       Physical Examination:   General appearance: alert, well appearing, and in no distress  Mental status: alert, oriented to person, place, and time  Skin: warm & dry   Cardiovascular: normal heart rate noted   Respiratory: normal respiratory effort, no distress   Breasts: deferred, no complaints   Abdomen: soft, non-tender, BTS incision well heale  Pelvic: VULVA: normal appearing vulva with no masses, tenderness or lesions, VAGINA: normal appearing vagina with normal color and discharge, no lesions, CERVIX: normal appearing cervix without discharge or lesions. Thin prep pap obtained: No  Rectal: not examined  Extremities: Edema: none   Chaperone: Clarita Salt       Results for orders placed or performed in visit on 08/11/24 (from the past 24 hours)  POCT hemoglobin   Collection Time: 08/11/24 11:16 AM  Result Value Ref Range   Hemoglobin 12.1 11 - 14.6 g/dL    Assessment & Plan:  1) Postpartum exam 2) 6 wks s/p spontaneous vaginal delivery 3) bottle feeding 4) Depression screening 5) Contraception s/p BTS 6) Vaginal d/c> CV swab 7) Resolved anemia> ok to stop po Fe 8) H/O abnormal pap> repeat 39yrs per [facility]  Essential components of care per ACOG recommendations:  1.  Mood and well being:  If positive depression screen, discussed and plan developed.  If using tobacco we discussed reduction/cessation and risk of relapse If current substance abuse, we discussed and referral to local resources was offered.   2. Infant care and feeding:  If breastfeeding, discussed returning to work, pumping, breastfeeding-associated pain, guidance regarding return to fertility while lactating if not using another method. If needed,  patient was provided with a letter to be allowed to pump q 2-3hrs to support lactation in a private location with access to a refrigerator to store breastmilk.   Recommended that all caregivers be immunized for flu, pertussis and other preventable communicable diseases If pt does not have material needs met for her/baby, referred to local resources for help obtaining these.  3. Sexuality, contraception and birth spacing Provided guidance regarding sexuality, management of dyspareunia, and resumption of intercourse Discussed avoiding interpregnancy interval <68mths and recommended birth spacing of 18 months  4. Sleep and fatigue Discussed coping options for fatigue and sleep disruption Encouraged family/partner/community support  of 4 hrs of uninterrupted sleep to help with mood and fatigue  5. Physical recovery  If pt had a C/S, assessed incisional pain and providing guidance on normal vs prolonged recovery If pt had a laceration, perineal healing and pain reviewed.  If urinary or fecal incontinence, discussed management and referred to PT or uro/gyn if indicated  Patient is safe to resume physical activity. Discussed attainment of healthy weight.  6.  Chronic disease management Discussed pregnancy complications if any, and their implications for future childbearing and long-term maternal health. Review recommendations for prevention of recurrent pregnancy complications, such as 17 hydroxyprogesterone caproate to reduce risk for recurrent PTB not applicable, or aspirin to reduce risk of preeclampsia not applicable. Pt had GDM: no. If yes, 2hr GTT scheduled: not applicable. Reviewed medications and non-pregnant dosing including consideration of whether pt is breastfeeding using a reliable resource such as LactMed: not applicable Referred for f/u w/ PCP or subspecialist providers as indicated: not applicable  7. Health maintenance Mammogram at 31yo or earlier if indicated Pap smears as  indicated  Meds: No orders of the defined types were placed in this encounter.   Follow-up: Return in about 1 year (around 08/11/2025) for Physical.   Orders Placed This Encounter  Procedures   POCT hemoglobin    Suzen JONELLE Fetters CNM, [facility] 08/11/2024 11:46 AM

## 2024-08-12 ENCOUNTER — Ambulatory Visit: Payer: Self-pay | Admitting: Women's Health

## 2024-08-12 LAB — CERVICOVAGINAL ANCILLARY ONLY
Bacterial Vaginitis (gardnerella): NEGATIVE
Candida Glabrata: NEGATIVE
Candida Vaginitis: NEGATIVE
Chlamydia: NEGATIVE
Comment: NEGATIVE
Comment: NEGATIVE
Comment: NEGATIVE
Comment: NEGATIVE
Comment: NEGATIVE
Comment: NORMAL
Neisseria Gonorrhea: NEGATIVE
Trichomonas: NEGATIVE

## 2024-08-13 DIAGNOSIS — Z419 Encounter for procedure for purposes other than remedying health state, unspecified: Secondary | ICD-10-CM | POA: Diagnosis not present

## 2024-09-03 ENCOUNTER — Other Ambulatory Visit: Payer: Self-pay | Admitting: Adult Health

## 2024-09-03 ENCOUNTER — Telehealth: Payer: Self-pay | Admitting: Adult Health

## 2024-09-03 NOTE — Telephone Encounter (Signed)
 Left message that lexapro  refill sent

## 2024-09-12 DIAGNOSIS — Z419 Encounter for procedure for purposes other than remedying health state, unspecified: Secondary | ICD-10-CM | POA: Diagnosis not present

## 2024-10-06 ENCOUNTER — Other Ambulatory Visit: Payer: Self-pay

## 2024-10-06 ENCOUNTER — Other Ambulatory Visit: Payer: Self-pay | Admitting: Women's Health

## 2024-10-06 ENCOUNTER — Encounter: Payer: Self-pay | Admitting: Women's Health

## 2024-10-06 MED ORDER — FLUCONAZOLE 150 MG PO TABS
150.0000 mg | ORAL_TABLET | Freq: Once | ORAL | 0 refills | Status: AC
  Filled 2024-10-06: qty 2, 4d supply, fill #0

## 2024-10-12 ENCOUNTER — Other Ambulatory Visit: Payer: Self-pay | Admitting: Women's Health

## 2024-10-12 MED ORDER — NITROFURANTOIN MONOHYD MACRO 100 MG PO CAPS: 100.0000 mg | ORAL_CAPSULE | Freq: Two times a day (BID) | ORAL | 0 refills | Status: AC

## 2024-10-13 DIAGNOSIS — Z419 Encounter for procedure for purposes other than remedying health state, unspecified: Secondary | ICD-10-CM | POA: Diagnosis not present

## 2024-10-25 ENCOUNTER — Encounter: Payer: Self-pay | Admitting: Medical

## 2024-11-04 ENCOUNTER — Other Ambulatory Visit: Payer: Self-pay

## 2024-11-04 MED ORDER — FLUCONAZOLE 150 MG PO TABS
150.0000 mg | ORAL_TABLET | ORAL | 0 refills | Status: AC | PRN
  Filled 2024-11-04: qty 1, 3d supply, fill #0

## 2024-11-04 MED ORDER — CIPROFLOXACIN HCL 500 MG PO TABS
500.0000 mg | ORAL_TABLET | Freq: Two times a day (BID) | ORAL | 0 refills | Status: AC
  Filled 2024-11-04: qty 6, 3d supply, fill #0

## 2024-11-12 DIAGNOSIS — Z419 Encounter for procedure for purposes other than remedying health state, unspecified: Secondary | ICD-10-CM | POA: Diagnosis not present

## 2024-11-22 ENCOUNTER — Other Ambulatory Visit: Payer: Self-pay

## 2024-11-22 MED ORDER — METRONIDAZOLE 500 MG PO TABS
500.0000 mg | ORAL_TABLET | Freq: Two times a day (BID) | ORAL | 0 refills | Status: DC
  Filled 2024-11-22: qty 14, 7d supply, fill #0

## 2024-11-23 ENCOUNTER — Other Ambulatory Visit: Payer: Self-pay

## 2024-12-03 ENCOUNTER — Other Ambulatory Visit: Payer: Self-pay | Admitting: Adult Health
